# Patient Record
Sex: Male | Born: 1965 | Race: White | Hispanic: No | Marital: Married | Smoking: Current every day smoker
Health system: Southern US, Community
[De-identification: ages and names within clinical notes are randomized; demographics above are authoritative.]

## PROBLEM LIST (undated history)

## (undated) DIAGNOSIS — E785 Hyperlipidemia, unspecified: Secondary | ICD-10-CM

## (undated) DIAGNOSIS — N200 Calculus of kidney: Secondary | ICD-10-CM

## (undated) DIAGNOSIS — G4730 Sleep apnea, unspecified: Secondary | ICD-10-CM

## (undated) DIAGNOSIS — E119 Type 2 diabetes mellitus without complications: Secondary | ICD-10-CM

## (undated) DIAGNOSIS — J13 Pneumonia due to Streptococcus pneumoniae: Secondary | ICD-10-CM

## (undated) DIAGNOSIS — I10 Essential (primary) hypertension: Secondary | ICD-10-CM

## (undated) HISTORY — DX: Calculus of kidney: N20.0

## (undated) HISTORY — PX: KIDNEY STONE SURGERY: SHX686

## (undated) HISTORY — DX: Essential (primary) hypertension: I10

## (undated) HISTORY — DX: Type 2 diabetes mellitus without complications: E11.9

## (undated) HISTORY — PX: TRACHEOSTOMY CLOSURE: SHX458

## (undated) HISTORY — PX: REMOVAL OF GASTROSTOMY TUBE: SHX6058

## (undated) HISTORY — PX: KIDNEY SURGERY: SHX687

## (undated) HISTORY — PX: LITHOTRIPSY: SUR834

## (undated) HISTORY — DX: Hyperlipidemia, unspecified: E78.5

---

## 2001-12-20 ENCOUNTER — Encounter: Admission: RE | Admit: 2001-12-20 | Discharge: 2001-12-20 | Payer: Self-pay | Admitting: Internal Medicine

## 2001-12-20 ENCOUNTER — Encounter: Payer: Self-pay | Admitting: Internal Medicine

## 2001-12-27 ENCOUNTER — Encounter: Admission: RE | Admit: 2001-12-27 | Discharge: 2001-12-27 | Payer: Self-pay | Admitting: Urology

## 2001-12-27 ENCOUNTER — Encounter: Payer: Self-pay | Admitting: Urology

## 2002-01-04 ENCOUNTER — Encounter: Payer: Self-pay | Admitting: Urology

## 2002-01-04 ENCOUNTER — Ambulatory Visit (HOSPITAL_COMMUNITY): Admission: RE | Admit: 2002-01-04 | Discharge: 2002-01-04 | Payer: Self-pay | Admitting: Urology

## 2002-02-07 ENCOUNTER — Inpatient Hospital Stay (HOSPITAL_COMMUNITY): Admission: RE | Admit: 2002-02-07 | Discharge: 2002-02-10 | Payer: Self-pay | Admitting: Urology

## 2002-02-07 ENCOUNTER — Encounter: Payer: Self-pay | Admitting: Urology

## 2002-02-08 ENCOUNTER — Encounter: Payer: Self-pay | Admitting: Urology

## 2002-02-13 ENCOUNTER — Encounter: Payer: Self-pay | Admitting: Urology

## 2002-02-13 ENCOUNTER — Ambulatory Visit (HOSPITAL_COMMUNITY): Admission: RE | Admit: 2002-02-13 | Discharge: 2002-02-13 | Payer: Self-pay | Admitting: Urology

## 2002-02-16 ENCOUNTER — Ambulatory Visit (HOSPITAL_COMMUNITY): Admission: RE | Admit: 2002-02-16 | Discharge: 2002-02-16 | Payer: Self-pay | Admitting: Urology

## 2002-02-16 ENCOUNTER — Encounter: Payer: Self-pay | Admitting: Urology

## 2002-04-05 ENCOUNTER — Encounter: Payer: Self-pay | Admitting: Urology

## 2002-04-05 ENCOUNTER — Ambulatory Visit (HOSPITAL_BASED_OUTPATIENT_CLINIC_OR_DEPARTMENT_OTHER): Admission: RE | Admit: 2002-04-05 | Discharge: 2002-04-05 | Payer: Self-pay | Admitting: Urology

## 2002-04-19 ENCOUNTER — Encounter: Payer: Self-pay | Admitting: Urology

## 2002-04-19 ENCOUNTER — Encounter: Admission: RE | Admit: 2002-04-19 | Discharge: 2002-04-19 | Payer: Self-pay | Admitting: Urology

## 2002-10-19 ENCOUNTER — Encounter: Payer: Self-pay | Admitting: *Deleted

## 2002-10-19 ENCOUNTER — Emergency Department (HOSPITAL_COMMUNITY): Admission: EM | Admit: 2002-10-19 | Discharge: 2002-10-19 | Payer: Self-pay | Admitting: *Deleted

## 2003-06-06 ENCOUNTER — Ambulatory Visit (HOSPITAL_COMMUNITY): Admission: RE | Admit: 2003-06-06 | Discharge: 2003-06-06 | Payer: Self-pay | Admitting: Urology

## 2004-06-16 ENCOUNTER — Emergency Department (HOSPITAL_COMMUNITY): Admission: EM | Admit: 2004-06-16 | Discharge: 2004-06-16 | Payer: Self-pay | Admitting: Emergency Medicine

## 2005-07-06 ENCOUNTER — Emergency Department (HOSPITAL_COMMUNITY): Admission: EM | Admit: 2005-07-06 | Discharge: 2005-07-06 | Payer: Self-pay | Admitting: Emergency Medicine

## 2005-07-30 ENCOUNTER — Ambulatory Visit (HOSPITAL_COMMUNITY): Admission: RE | Admit: 2005-07-30 | Discharge: 2005-08-01 | Payer: Self-pay | Admitting: Urology

## 2005-08-04 ENCOUNTER — Ambulatory Visit (HOSPITAL_COMMUNITY): Admission: RE | Admit: 2005-08-04 | Discharge: 2005-08-04 | Payer: Self-pay | Admitting: Urology

## 2010-04-12 ENCOUNTER — Encounter: Payer: Self-pay | Admitting: Urology

## 2012-03-22 DIAGNOSIS — J13 Pneumonia due to Streptococcus pneumoniae: Secondary | ICD-10-CM

## 2012-03-22 HISTORY — DX: Pneumonia due to Streptococcus pneumoniae: J13

## 2013-04-24 ENCOUNTER — Inpatient Hospital Stay: Payer: BC Managed Care – PPO | Admitting: Internal Medicine

## 2013-04-24 ENCOUNTER — Emergency Department: Payer: BC Managed Care – PPO

## 2013-04-24 ENCOUNTER — Inpatient Hospital Stay: Payer: BC Managed Care – PPO

## 2013-04-24 ENCOUNTER — Inpatient Hospital Stay
Admission: EM | Admit: 2013-04-24 | Discharge: 2013-05-23 | DRG: 004 | Disposition: A | Discharge status: Skilled Nursing Facility | Payer: BC Managed Care – PPO | Attending: Internal Medicine | Admitting: Internal Medicine

## 2013-04-24 DIAGNOSIS — B999 Unspecified infectious disease: Secondary | ICD-10-CM

## 2013-04-24 DIAGNOSIS — E872 Acidosis, unspecified: Secondary | ICD-10-CM | POA: Diagnosis present

## 2013-04-24 DIAGNOSIS — A403 Sepsis due to Streptococcus pneumoniae: Principal | ICD-10-CM | POA: Diagnosis present

## 2013-04-24 DIAGNOSIS — N179 Acute kidney failure, unspecified: Secondary | ICD-10-CM | POA: Diagnosis present

## 2013-04-24 DIAGNOSIS — A419 Sepsis, unspecified organism: Secondary | ICD-10-CM | POA: Diagnosis present

## 2013-04-24 DIAGNOSIS — Y929 Unspecified place or not applicable: Secondary | ICD-10-CM

## 2013-04-24 DIAGNOSIS — R197 Diarrhea, unspecified: Secondary | ICD-10-CM | POA: Diagnosis not present

## 2013-04-24 DIAGNOSIS — E669 Obesity, unspecified: Secondary | ICD-10-CM | POA: Diagnosis present

## 2013-04-24 DIAGNOSIS — E119 Type 2 diabetes mellitus without complications: Secondary | ICD-10-CM | POA: Diagnosis present

## 2013-04-24 DIAGNOSIS — IMO0001 Reserved for inherently not codable concepts without codable children: Secondary | ICD-10-CM | POA: Diagnosis present

## 2013-04-24 DIAGNOSIS — T17308A Unspecified foreign body in larynx causing other injury, initial encounter: Secondary | ICD-10-CM | POA: Diagnosis not present

## 2013-04-24 DIAGNOSIS — I10 Essential (primary) hypertension: Secondary | ICD-10-CM | POA: Diagnosis present

## 2013-04-24 DIAGNOSIS — J9600 Acute respiratory failure, unspecified whether with hypoxia or hypercapnia: Secondary | ICD-10-CM | POA: Diagnosis present

## 2013-04-24 DIAGNOSIS — E871 Hypo-osmolality and hyponatremia: Secondary | ICD-10-CM | POA: Diagnosis not present

## 2013-04-24 DIAGNOSIS — R000 Tachycardia, unspecified: Secondary | ICD-10-CM | POA: Diagnosis present

## 2013-04-24 DIAGNOSIS — R5383 Other fatigue: Secondary | ICD-10-CM | POA: Diagnosis not present

## 2013-04-24 DIAGNOSIS — E876 Hypokalemia: Secondary | ICD-10-CM | POA: Diagnosis present

## 2013-04-24 DIAGNOSIS — X58XXXA Exposure to other specified factors, initial encounter: Secondary | ICD-10-CM | POA: Diagnosis present

## 2013-04-24 DIAGNOSIS — F17200 Nicotine dependence, unspecified, uncomplicated: Secondary | ICD-10-CM | POA: Diagnosis present

## 2013-04-24 DIAGNOSIS — N170 Acute kidney failure with tubular necrosis: Secondary | ICD-10-CM | POA: Diagnosis present

## 2013-04-24 DIAGNOSIS — R7989 Other specified abnormal findings of blood chemistry: Secondary | ICD-10-CM | POA: Diagnosis not present

## 2013-04-24 DIAGNOSIS — E870 Hyperosmolality and hypernatremia: Secondary | ICD-10-CM | POA: Diagnosis present

## 2013-04-24 DIAGNOSIS — R6521 Severe sepsis with septic shock: Secondary | ICD-10-CM | POA: Diagnosis present

## 2013-04-24 DIAGNOSIS — D649 Anemia, unspecified: Secondary | ICD-10-CM | POA: Diagnosis not present

## 2013-04-24 DIAGNOSIS — N200 Calculus of kidney: Secondary | ICD-10-CM | POA: Diagnosis present

## 2013-04-24 DIAGNOSIS — J80 Acute respiratory distress syndrome: Secondary | ICD-10-CM | POA: Diagnosis present

## 2013-04-24 DIAGNOSIS — G589 Mononeuropathy, unspecified: Secondary | ICD-10-CM | POA: Diagnosis not present

## 2013-04-24 DIAGNOSIS — IMO0002 Reserved for concepts with insufficient information to code with codable children: Secondary | ICD-10-CM | POA: Diagnosis present

## 2013-04-24 DIAGNOSIS — J13 Pneumonia due to Streptococcus pneumoniae: Secondary | ICD-10-CM | POA: Diagnosis present

## 2013-04-24 DIAGNOSIS — R6520 Severe sepsis without septic shock: Secondary | ICD-10-CM | POA: Diagnosis present

## 2013-04-24 DIAGNOSIS — D696 Thrombocytopenia, unspecified: Secondary | ICD-10-CM | POA: Diagnosis present

## 2013-04-24 DIAGNOSIS — I82B19 Acute embolism and thrombosis of unspecified subclavian vein: Secondary | ICD-10-CM | POA: Diagnosis not present

## 2013-04-24 DIAGNOSIS — J9690 Respiratory failure, unspecified, unspecified whether with hypoxia or hypercapnia: Secondary | ICD-10-CM | POA: Diagnosis present

## 2013-04-24 DIAGNOSIS — J189 Pneumonia, unspecified organism: Secondary | ICD-10-CM | POA: Diagnosis present

## 2013-04-24 DIAGNOSIS — R609 Edema, unspecified: Secondary | ICD-10-CM | POA: Diagnosis present

## 2013-04-24 DIAGNOSIS — I82629 Acute embolism and thrombosis of deep veins of unspecified upper extremity: Secondary | ICD-10-CM | POA: Diagnosis present

## 2013-04-24 DIAGNOSIS — G7281 Critical illness myopathy: Secondary | ICD-10-CM | POA: Diagnosis not present

## 2013-04-24 DIAGNOSIS — I82A19 Acute embolism and thrombosis of unspecified axillary vein: Secondary | ICD-10-CM | POA: Diagnosis not present

## 2013-04-24 DIAGNOSIS — Z87442 Personal history of urinary calculi: Secondary | ICD-10-CM

## 2013-04-24 LAB — MAN DIFF ONLY
Band Neutrophils Absolute: 7.87 10*3/uL — ABNORMAL HIGH (ref 0.00–1.00)
Band Neutrophils: 43 %
Basophils Absolute Manual: 0.18 10*3/uL (ref 0.00–0.20)
Basophils Manual: 1 %
Eosinophils Absolute Manual: 0.18 10*3/uL (ref 0.00–0.70)
Eosinophils Manual: 1 %
Lymphocytes Absolute Manual: 1.46 10*3/uL (ref 0.50–4.40)
Lymphocytes Manual: 8 %
Monocytes Absolute: 0.18 10*3/uL (ref 0.00–1.20)
Monocytes Manual: 1 %
Neutrophils Absolute Manual: 8.42 10*3/uL — ABNORMAL HIGH (ref 1.80–8.10)
Nucleated RBC: 0 (ref 0–1)
Segmented Neutrophils: 46 %

## 2013-04-24 LAB — COMPREHENSIVE METABOLIC PANEL
ALT: 44 U/L (ref 0–55)
AST (SGOT): 42 U/L — ABNORMAL HIGH (ref 5–34)
Albumin/Globulin Ratio: 0.9 (ref 0.9–2.2)
Albumin: 3.4 g/dL — ABNORMAL LOW (ref 3.5–5.0)
Alkaline Phosphatase: 58 U/L (ref 40–150)
Anion Gap: 20 — ABNORMAL HIGH (ref 5.0–15.0)
BUN: 22 mg/dL — ABNORMAL HIGH (ref 9.0–21.0)
Bilirubin, Total: 1.4 mg/dL — ABNORMAL HIGH (ref 0.2–1.2)
CO2: 20 mEq/L — ABNORMAL LOW (ref 22–29)
Calcium: 9.4 mg/dL (ref 8.5–10.5)
Chloride: 103 mEq/L (ref 98–107)
Creatinine: 1 mg/dL (ref 0.7–1.3)
Globulin: 3.6 g/dL (ref 2.0–3.6)
Glucose: 155 mg/dL — ABNORMAL HIGH (ref 70–100)
Potassium: 3.4 mEq/L — ABNORMAL LOW (ref 3.5–5.1)
Protein, Total: 7 g/dL (ref 6.0–8.3)
Sodium: 143 mEq/L (ref 136–145)

## 2013-04-24 LAB — URINALYSIS WITH MICROSCOPIC
Glucose, UA: 100 — AB
Ketones UA: NEGATIVE
Leukocyte Esterase, UA: NEGATIVE
Nitrite, UA: NEGATIVE
Protein, UR: 100 — AB
Specific Gravity UA: 1.03 (ref 1.001–1.035)
Urine pH: 5.5 (ref 5.0–8.0)
Urobilinogen, UA: 0.2 mg/dL (ref 0.2–2.0)

## 2013-04-24 LAB — ARTERIAL BLOOD GAS (~~LOC~~)
Base Excess, Arterial: -4.1 mEq/L — ABNORMAL LOW (ref <=2.0)
Base Excess, Arterial: -7.6 mEq/L — ABNORMAL LOW (ref <=2.0)
FIO2: 100 %
FIO2: 70 %
HCO3, Arterial: 25.4 mEq/L (ref 22.0–28.0)
HCO3, Arterial: 21 mEq/L — ABNORMAL LOW (ref 22.0–28.0)
O2 Sat, Arterial: 96 % (ref 95.0–100.0)
O2 Sat, Arterial: 85 % — ABNORMAL LOW (ref 95.0–100.0)
PEEP: 8
PEEP: 14
Peak Inspiratory Pressure: 31
Rate: 24
Rate: 28
Respiratory Rate: 28
Temperature: 98.6
Temperature: 100.6
Tidal vol.: 400
Tidal vol.: 380
pCO2, Arterial: 68 (ref 35.0–45.0)
pCO2, Arterial: 55 — ABNORMAL HIGH (ref 35.0–45.0)
pH, Arterial: 7.18 — CL (ref 7.35–7.45)
pH, Arterial: 7.19 — CL (ref 7.35–7.45)
pO2, Arterial: 103 — ABNORMAL HIGH (ref 80.0–100.0)
pO2, Arterial: 63 — ABNORMAL LOW (ref 80.0–100.0)

## 2013-04-24 LAB — GLUCOSE WHOLE BLOOD - POCT
Whole Blood Glucose POCT: 192 mg/dL — ABNORMAL HIGH (ref 70–100)
Whole Blood Glucose POCT: 220 mg/dL — ABNORMAL HIGH (ref 70–100)

## 2013-04-24 LAB — CELL MORPHOLOGY
Cell Morphology: ABNORMAL — AB
Platelet Estimate: NORMAL

## 2013-04-24 LAB — RAPID DRUG SCREEN, URINE
Barbiturate Screen, UR: NEGATIVE
Benzodiazepine Screen, UR: POSITIVE — AB
Cannabinoid Screen, UR: NEGATIVE
Cocaine, UR: NEGATIVE
Opiate Screen, UR: NEGATIVE
PCP Screen, UR: NEGATIVE
Urine Amphetamine Screen: NEGATIVE

## 2013-04-24 LAB — URINALYSIS
Bilirubin, UA: NEGATIVE
Glucose, UA: NEGATIVE
Leukocyte Esterase, UA: NEGATIVE
Nitrite, UA: NEGATIVE
Protein, UR: 100 — AB
Specific Gravity UA: 1.025 (ref 1.001–1.035)
Urine pH: 6 (ref 5.0–8.0)
Urobilinogen, UA: 0.2 mg/dL (ref 0.2–2.0)

## 2013-04-24 LAB — LACTIC ACID, PLASMA
Lactic Acid: 7 mmol/L (ref 0.7–2.1)
Lactic Acid: 4.9 mmol/L (ref 0.7–2.1)

## 2013-04-24 LAB — CBC AND DIFFERENTIAL
Hematocrit: 44.1 % (ref 42.0–52.0)
Hgb: 14.5 g/dL (ref 13.0–17.0)
MCH: 28.9 pg (ref 28.0–32.0)
MCHC: 32.9 g/dL (ref 32.0–36.0)
MCV: 88 fL (ref 80.0–100.0)
MPV: 11.6 fL (ref 9.4–12.3)
Platelets: 134 10*3/uL — ABNORMAL LOW (ref 140–400)
RBC: 5.01 10*6/uL (ref 4.70–6.00)
RDW: 15 % (ref 12–15)
WBC: 18.31 10*3/uL — ABNORMAL HIGH (ref 3.50–10.80)

## 2013-04-24 LAB — URINE MICROSCOPIC

## 2013-04-24 LAB — CK
Creatine Kinase (CK): 150 U/L (ref 30–200)
Creatine Kinase (CK): 101 U/L (ref 30–200)

## 2013-04-24 LAB — IHS D-DIMER: D-Dimer: 1.35 — ABNORMAL HIGH (ref 0.00–0.51)

## 2013-04-24 LAB — B-TYPE NATRIURETIC PEPTIDE: B-Natriuretic Peptide: 494.6 pg/mL — ABNORMAL HIGH (ref 0.0–100.0)

## 2013-04-24 LAB — ETHANOL: Alcohol: NOT DETECTED mg/dL

## 2013-04-24 LAB — MAGNESIUM: Magnesium: 1.6 mg/dL (ref 1.6–2.6)

## 2013-04-24 LAB — GFR: EGFR: >60

## 2013-04-24 LAB — TROPONIN I
Troponin I: 0.01 ng/mL (ref 0.00–0.09)
Troponin I: 0.02 ng/mL (ref 0.00–0.09)

## 2013-04-24 MED ORDER — SODIUM CHLORIDE 0.9 % IV SOLN
1.0000 mg/h | INTRAVENOUS | Status: DC
Start: 2013-04-24 — End: 2013-04-27
  Administered 2013-04-24: 5 mg/h via INTRAVENOUS
  Administered 2013-04-24 – 2013-04-26 (×6): 20 mg/h via INTRAVENOUS
  Administered 2013-04-26: 18 mg/h via INTRAVENOUS
  Administered 2013-04-26 (×2): 20 mg/h via INTRAVENOUS
  Administered 2013-04-27 (×2): 13 mg/h via INTRAVENOUS
  Administered 2013-04-27: 14 mg/h via INTRAVENOUS
  Filled 2013-04-24 (×13): qty 20

## 2013-04-24 MED ORDER — ETOMIDATE 2 MG/ML IV SOLN
INTRAVENOUS | Status: AC | PRN
Start: 2013-04-24 — End: 2013-04-24
  Administered 2013-04-24: 30 mg via INTRAVENOUS

## 2013-04-24 MED ORDER — FENTANYL 10 MCG/ML NS 100 ML (OUTSOURCED)
12.5000 ug/h | INTRAVENOUS | Status: DC
Start: 2013-04-24 — End: 2013-04-27
  Administered 2013-04-24: 200 ug/h via INTRAVENOUS
  Administered 2013-04-24: 12.5 ug/h via INTRAVENOUS
  Administered 2013-04-25 (×5): 200 ug/h via INTRAVENOUS
  Administered 2013-04-26: 100 ug/h via INTRAVENOUS
  Administered 2013-04-26 (×2): 200 ug/h via INTRAVENOUS
  Administered 2013-04-26: 175 ug/h via INTRAVENOUS
  Administered 2013-04-27: 80 ug/h via INTRAVENOUS
  Filled 2013-04-24 (×12): qty 100

## 2013-04-24 MED ORDER — DOCUSATE SODIUM 50 MG/5ML PO LIQD
100.0000 mg | Freq: Two times a day (BID) | ORAL | Status: DC
Start: 2013-04-24 — End: 2013-05-10
  Administered 2013-04-24 – 2013-05-09 (×12): 100 mg via ORAL
  Filled 2013-04-24 (×12): qty 10

## 2013-04-24 MED ORDER — ENOXAPARIN SODIUM 40 MG/0.4ML SC SOLN
40.0000 mg | Freq: Every day | SUBCUTANEOUS | Status: DC
Start: 2013-04-24 — End: 2013-05-02
  Administered 2013-04-24 – 2013-05-02 (×9): 40 mg via SUBCUTANEOUS
  Filled 2013-04-24 (×9): qty 0.4

## 2013-04-24 MED ORDER — GLUCOSE 40 % PO GEL
15.0000 g | ORAL | Status: DC | PRN
Start: 2013-04-24 — End: 2013-05-15

## 2013-04-24 MED ORDER — SODIUM CHLORIDE 0.9 % IV SOLN
INTRAVENOUS | Status: DC
Start: 2013-04-24 — End: 2013-04-24
  Administered 2013-04-24: 150 mL/h via INTRAVENOUS

## 2013-04-24 MED ORDER — OSELTAMIVIR PHOSPHATE 30 MG/5 ML ORAL SYRINGE
75.0000 mg | Freq: Two times a day (BID) | ORAL | Status: DC
Start: 2013-04-24 — End: 2013-04-24

## 2013-04-24 MED ORDER — INSULIN ASPART 100 UNIT/ML SC SOLN
1.0000 [IU] | SUBCUTANEOUS | Status: DC
Start: 2013-04-24 — End: 2013-04-26
  Administered 2013-04-24: 3 [IU] via SUBCUTANEOUS
  Administered 2013-04-24: 1 [IU] via SUBCUTANEOUS
  Administered 2013-04-25 (×4): 5 [IU] via SUBCUTANEOUS
  Administered 2013-04-25: 3 [IU] via SUBCUTANEOUS
  Administered 2013-04-25 – 2013-04-26 (×2): 5 [IU] via SUBCUTANEOUS
  Administered 2013-04-26: 7 [IU] via SUBCUTANEOUS
  Administered 2013-04-26: 5 [IU] via SUBCUTANEOUS
  Filled 2013-04-24: qty 10
  Filled 2013-04-24 (×4): qty 50
  Filled 2013-04-24: qty 30
  Filled 2013-04-24 (×2): qty 50
  Filled 2013-04-24: qty 70
  Filled 2013-04-24: qty 30
  Filled 2013-04-24: qty 50

## 2013-04-24 MED ORDER — SODIUM CHLORIDE 0.9 % IV MBP
1.0000 g | Freq: Once | INTRAVENOUS | Status: AC
Start: 2013-04-24 — End: 2013-04-24
  Administered 2013-04-24: 1 g via INTRAVENOUS
  Filled 2013-04-24: qty 1000
  Filled 2013-04-24: qty 50

## 2013-04-24 MED ORDER — CHLORHEXIDINE GLUCONATE 0.12 % MT SOLN
15.0000 mL | Freq: Two times a day (BID) | OROMUCOSAL | Status: DC
Start: 2013-04-24 — End: 2013-05-21
  Administered 2013-04-24 – 2013-05-20 (×53): 15 mL via OROMUCOSAL

## 2013-04-24 MED ORDER — VECURONIUM BROMIDE 10 MG IV SOLR
INTRAVENOUS | Status: AC
Start: 2013-04-24 — End: 2013-04-24
  Administered 2013-04-24: 10 mg
  Filled 2013-04-24: qty 10

## 2013-04-24 MED ORDER — PROPOFOL 10 MG/ML IV EMUL
INTRAVENOUS | Status: AC
Start: 2013-04-24 — End: 2013-04-24
  Filled 2013-04-24: qty 100

## 2013-04-24 MED ORDER — SODIUM CHLORIDE 0.9 % IV BOLUS
1000.0000 mL | Freq: Once | INTRAVENOUS | Status: AC
Start: 2013-04-24 — End: 2013-04-24
  Administered 2013-04-24: 1000 mL via INTRAVENOUS

## 2013-04-24 MED ORDER — METHYLPREDNISOLONE SODIUM SUCC 125 MG IJ SOLR
125.0000 mg | Freq: Once | INTRAMUSCULAR | Status: AC
Start: 2013-04-24 — End: 2013-04-24
  Administered 2013-04-24: 125 mg via INTRAVENOUS
  Filled 2013-04-24: qty 2

## 2013-04-24 MED ORDER — THIAMINE HCL 100 MG PO TABS
100.0000 mg | ORAL_TABLET | Freq: Every day | ORAL | Status: DC
Start: 2013-04-24 — End: 2013-05-10
  Administered 2013-04-24 – 2013-05-09 (×16): 100 mg via ORAL
  Filled 2013-04-24 (×16): qty 1

## 2013-04-24 MED ORDER — LORAZEPAM 2 MG/ML IJ SOLN
INTRAMUSCULAR | Status: AC
Start: 2013-04-24 — End: 2013-04-24
  Administered 2013-04-24: 2 mg
  Filled 2013-04-24: qty 1

## 2013-04-24 MED ORDER — LEVOFLOXACIN IN D5W 750 MG/150ML IV SOLN
750.0000 mg | Freq: Every day | INTRAVENOUS | Status: DC
Start: 2013-04-24 — End: 2013-04-25
  Administered 2013-04-24: 750 mg via INTRAVENOUS
  Filled 2013-04-24: qty 150

## 2013-04-24 MED ORDER — ACETAMINOPHEN 160 MG/5ML PO SOLN
500.0000 mg | ORAL | Status: DC | PRN
Start: 2013-04-24 — End: 2013-05-23
  Administered 2013-04-24 – 2013-05-16 (×18): 500 mg via ORAL
  Filled 2013-04-24 (×19): qty 20.3

## 2013-04-24 MED ORDER — ALBUTEROL SULFATE (2.5 MG/3ML) 0.083% IN NEBU
2.5000 mg | INHALATION_SOLUTION | Freq: Once | RESPIRATORY_TRACT | Status: AC
Start: 2013-04-24 — End: 2013-04-24

## 2013-04-24 MED ORDER — PHENYLEPHRINE INFUSION (SIMPLE)
50.0000 ug/min | Status: DC
Start: 2013-04-24 — End: 2013-05-03
  Administered 2013-04-24: 50 ug/min via INTRAVENOUS
  Administered 2013-04-24: 500 ug/min via INTRAVENOUS
  Administered 2013-04-25: 350 ug/min via INTRAVENOUS
  Administered 2013-04-25: 175 ug/min via INTRAVENOUS
  Administered 2013-04-25: 220 ug/min via INTRAVENOUS
  Administered 2013-04-26: 50 ug/min via INTRAVENOUS
  Filled 2013-04-24 (×6): qty 250

## 2013-04-24 MED ORDER — PIPERACILLIN-TAZOBACTAM 4.5 GM IN NS 100 ML IVPB (CNR)
4.5000 g | Freq: Three times a day (TID) | INTRAVENOUS | Status: DC
Start: 2013-04-24 — End: 2013-04-24

## 2013-04-24 MED ORDER — VANCOMYCIN 1000 MG IN 250 ML NS IVPB VIAL-MATE (CNR)
1000.0000 mg | Freq: Three times a day (TID) | INTRAVENOUS | Status: DC
Start: 2013-04-25 — End: 2013-04-25
  Administered 2013-04-25 (×2): 1000 mg via INTRAVENOUS
  Filled 2013-04-24 (×3): qty 250

## 2013-04-24 MED ORDER — OSELTAMIVIR PHOSPHATE 30 MG/5 ML ORAL SYRINGE
75.0000 mg | Freq: Two times a day (BID) | ORAL | Status: DC
Start: 2013-04-24 — End: 2013-04-25
  Administered 2013-04-24: 75 mg via ORAL
  Filled 2013-04-24 (×5): qty 12.5

## 2013-04-24 MED ORDER — SUCCINYLCHOLINE CHLORIDE 20 MG/ML IJ SOLN
INTRAMUSCULAR | Status: AC | PRN
Start: 2013-04-24 — End: 2013-04-24
  Administered 2013-04-24: 200 mg via INTRAVENOUS

## 2013-04-24 MED ORDER — FENTANYL CITRATE 0.05 MG/ML IJ SOLN
INTRAMUSCULAR | Status: AC
Start: 2013-04-24 — End: 2013-04-24
  Administered 2013-04-24: 25 ug via INTRAVENOUS
  Filled 2013-04-24: qty 5

## 2013-04-24 MED ORDER — IPRATROPIUM BROMIDE 0.02 % IN SOLN
0.5000 mg | Freq: Once | RESPIRATORY_TRACT | Status: AC
Start: 2013-04-24 — End: 2013-04-24
  Administered 2013-04-24: 0.5 mg via RESPIRATORY_TRACT
  Filled 2013-04-24: qty 2.5

## 2013-04-24 MED ORDER — ALBUTEROL SULFATE (2.5 MG/3ML) 0.083% IN NEBU
2.5000 mg | INHALATION_SOLUTION | Freq: Once | RESPIRATORY_TRACT | Status: AC
Start: 2013-04-24 — End: 2013-04-24
  Administered 2013-04-24: 2.5 mg via RESPIRATORY_TRACT
  Filled 2013-04-24: qty 3

## 2013-04-24 MED ORDER — DEXTROSE 50 % IV SOLN
25.0000 mL | INTRAVENOUS | Status: DC | PRN
Start: 2013-04-24 — End: 2013-05-15

## 2013-04-24 MED ORDER — LORAZEPAM 2 MG/ML IJ SOLN
2.0000 mg | INTRAMUSCULAR | Status: DC | PRN
Start: 2013-04-24 — End: 2013-04-27

## 2013-04-24 MED ORDER — SODIUM CHLORIDE 0.9 % IV SOLN
500.0000 mg | Freq: Once | INTRAVENOUS | Status: AC
Start: 2013-04-24 — End: 2013-04-24
  Administered 2013-04-24: 500 mg via INTRAVENOUS
  Filled 2013-04-24: qty 500

## 2013-04-24 MED ORDER — ALBUTEROL SULFATE (2.5 MG/3ML) 0.083% IN NEBU
2.5000 mg | INHALATION_SOLUTION | RESPIRATORY_TRACT | Status: DC | PRN
Start: 2013-04-24 — End: 2013-05-23
  Administered 2013-05-06: 2.5 mg via RESPIRATORY_TRACT
  Filled 2013-04-24: qty 3

## 2013-04-24 MED ORDER — ALBUTEROL SULFATE (2.5 MG/3ML) 0.083% IN NEBU
INHALATION_SOLUTION | RESPIRATORY_TRACT | Status: AC
Start: 2013-04-24 — End: 2013-04-24
  Administered 2013-04-24: 2.5 mg via RESPIRATORY_TRACT
  Filled 2013-04-24: qty 3

## 2013-04-24 MED ORDER — PROPOFOL INFUSION 10 MG/ML
0.0000 ug/kg/min | INTRAVENOUS | Status: DC
Start: 2013-04-24 — End: 2013-04-27
  Administered 2013-04-24: 20 ug/kg/min via INTRAVENOUS
  Administered 2013-04-25: 10 ug/kg/min via INTRAVENOUS
  Administered 2013-04-25: 20 ug/kg/min via INTRAVENOUS
  Administered 2013-04-25: 10 ug/kg/min via INTRAVENOUS
  Administered 2013-04-25 – 2013-04-26 (×2): 20 ug/kg/min via INTRAVENOUS
  Filled 2013-04-24 (×7): qty 100

## 2013-04-24 MED ORDER — SODIUM BICARBONATE 8.4 % IV SOLN
100.0000 mL/h | INTRAVENOUS | Status: DC
Start: 2013-04-24 — End: 2013-04-25
  Administered 2013-04-24 – 2013-04-25 (×3): 150 mL/h via INTRAVENOUS
  Filled 2013-04-24 (×3): qty 1000

## 2013-04-24 MED ORDER — GLUCAGON HCL (RDNA) 1 MG IJ SOLR
1.0000 mg | INTRAMUSCULAR | Status: DC | PRN
Start: 2013-04-24 — End: 2013-05-15

## 2013-04-24 MED ORDER — FAMOTIDINE 20MG/2.5ML PO UNIT DOSE SYRINGE
20.0000 mg | Freq: Two times a day (BID) | ORAL | Status: DC
Start: 2013-04-24 — End: 2013-05-03
  Administered 2013-04-24 – 2013-05-03 (×18): 20 mg via OROGASTRIC
  Filled 2013-04-24 (×19): qty 2.5

## 2013-04-24 MED ORDER — VANCOMYCIN HCL 1000 MG IV SOLR
2500.0000 mg | Freq: Once | INTRAVENOUS | Status: AC
Start: 2013-04-24 — End: 2013-04-24
  Administered 2013-04-24: 2500 mg via INTRAVENOUS
  Filled 2013-04-24: qty 2500

## 2013-04-24 MED ORDER — METHYLPREDNISOLONE SODIUM SUCC 40 MG IJ SOLR
40.0000 mg | Freq: Four times a day (QID) | INTRAMUSCULAR | Status: DC
Start: 2013-04-24 — End: 2013-04-25
  Administered 2013-04-24 – 2013-04-25 (×3): 40 mg via INTRAVENOUS
  Filled 2013-04-24 (×3): qty 1

## 2013-04-24 MED ORDER — FENTANYL CITRATE 0.05 MG/ML IJ SOLN
25.0000 ug | INTRAMUSCULAR | Status: DC | PRN
Start: 2013-04-24 — End: 2013-04-29

## 2013-04-24 MED ORDER — ALBUTEROL-IPRATROPIUM 2.5-0.5 (3) MG/3ML IN SOLN
3.0000 mL | RESPIRATORY_TRACT | Status: DC
Start: 2013-04-24 — End: 2013-05-23
  Administered 2013-04-24 – 2013-05-23 (×172): 3 mL via RESPIRATORY_TRACT
  Filled 2013-04-24 (×168): qty 3

## 2013-04-24 MED ORDER — VECURONIUM BROMIDE 10 MG IV SOLR
10.0000 mg | Freq: Four times a day (QID) | INTRAVENOUS | Status: DC | PRN
Start: 2013-04-24 — End: 2013-05-10
  Administered 2013-04-24: 10 mg via INTRAVENOUS
  Filled 2013-04-24: qty 10

## 2013-04-24 MED ORDER — POLYETHYLENE GLYCOL 3350 17 G PO PACK
17.0000 g | PACK | Freq: Two times a day (BID) | ORAL | Status: DC
Start: 2013-04-24 — End: 2013-05-10
  Administered 2013-04-24 – 2013-05-09 (×10): 17 g via ORAL
  Filled 2013-04-24 (×10): qty 1

## 2013-04-24 MED ORDER — PIPERACILLIN-TAZOBACTAM 4.5 GM IN NS 100 ML IVPB (CNR)
4.5000 g | Freq: Three times a day (TID) | INTRAVENOUS | Status: DC
Start: 2013-04-24 — End: 2013-04-26
  Administered 2013-04-24 – 2013-04-26 (×6): 4.5 g via INTRAVENOUS
  Filled 2013-04-24 (×6): qty 100

## 2013-04-24 MED ORDER — SODIUM CHLORIDE 0.9 % IV BOLUS
1000.0000 mL | INTRAVENOUS | Status: AC | PRN
Start: 2013-04-24 — End: 2013-04-24
  Administered 2013-04-24 (×5): 1000 mL via INTRAVENOUS

## 2013-04-24 MED ORDER — VECURONIUM BROMIDE 10 MG IV SOLR
10.0000 mg | Freq: Once | INTRAVENOUS | Status: AC
Start: 2013-04-24 — End: 2013-04-24
  Administered 2013-04-24: 10 mg via INTRAVENOUS
  Filled 2013-04-24: qty 10

## 2013-04-24 MED ORDER — RISAQUAD PO CAPS
1.0000 | ORAL_CAPSULE | Freq: Every day | ORAL | Status: DC
Start: 2013-04-24 — End: 2013-05-23
  Administered 2013-04-24 – 2013-05-23 (×28): 1 via ORAL
  Filled 2013-04-24 (×28): qty 1

## 2013-04-24 NOTE — Consults (Addendum)
Department of Pharmacy Vancomycin Dosing Consult:  Initiation    MRN: [id number]  Room/Bed: 02/02    Adam Simmons is being treated with Vancomycin for Pneumonia.  Pharmacy was consulted to dose Vancomycin by Dr. Janalyn Rouse.    Dosing was based on the following parameters:  Pt Age:  48 y.o.  Pt Weight:  113.4 kg  Pt Height:  68 inches  Estimated Creatinine Clearance: 110.4 ml/min (based on Cr of 1).  Target trough: 17.5 mcg/mL    The following orders were entered in EPIC:  Vancomycin loading dose of 2500 mg IV.  Vancomycin maintenance dose of 1000mg  IV every  8 hours.  Vancomycin trough prior to the 5th dose.    Pharmacy will monitor drug levels and kidney function and adjust doses as necessary.    Thank you for the consult.  Please contact the pharmacy with any questions at  [phone].    Signed:  Ivy Lynn  Date/Time 04/24/2013 2:22 PM      [facility] Department of Pharmacy Vancomycin Kinetics Dosing Consult:  Dose Adjustment    MRN: [id number]  Room/Bed:  IC05/IC05-A    Estimated Creatinine Clearance: 65.1 ml/min (based on Cr of 1.8).  Target trough: 17.5 mcg/mL    Based on the current Scr of 1.8 and deteorioting kidney function, hold current Vancomycin regimen starting today.  Pharmacy to order Random and adjust dose based on levels.    Pharmacy will continue to monitor drug level and kidney function and adjust doses as necessary.    Thank you for the consult.  Please contact the pharmacy with any questions at  [phone].    Signed:  Harrie Foreman    Date/Time 04/25/2013 5:04 PM                    [facility] Department of Pharmacy Vancomycin Kinetics Dosing Consult:  Dose Adjustment    MRN: [id number]  Room/Bed:  IC05/IC05-A    Adam Simmons  is being treated with Vancomycin for pneumonia.  Pharmacy was consulted to dose Vancomycin by Dr.Choudhary.    Based on the following parameters:  Pt Age:  48 y.o.  Pt Weight:  134.7 kg  Pt Height:  68 inches  Estimated Creatinine Clearance: 41.8 ml/min (based on Cr of 2.9).  Target trough: 17.5  mcg/mL    Vancomycin Random   Date Value Range Status   04/27/2013 12.0   Final       Based on the random Vancomycin level drawn on 04/27/13 around 0400, level = 12, give Vancomycin 1500mg  x 1 today. Draw next random Vancomycin level on 04/29/13 with AM labs.    Pharmacy will continue to monitor drug level and kidney function and adjust doses as necessary.    Thank you for the consult.  Please contact the pharmacy with any questions at  [phone].    Signed:  Luiz Ochoa    Date/Time 04/27/2013 6:57 AM

## 2013-04-24 NOTE — Plan of Care (Signed)
Infectious Disease   Full Consult Dictated    04/24/2013   Nylan Nevel JYN:[id number]:[id number] is a 48 y.o. male, with respiratory failure, pneumonia, sepsis      Recommendations:  I would like to suggest the following approach:    Therapy     Zosyn   Levaquin   Vancomycin    LABS:      CBC with Diff   CMP   Urine analysis & Culture   Urinary Legionella and strep Pneumonia antigen   Chlamydia antibody panel   Mycoplasma IgM, IgG   Repeat Blood cultures if Spike more than 100.5.   Will Follow the serologies.    Radiology:   CT chest when stable     I will follow this patient Closely with you, Thank you Arline Asp for Involving me in care of  Gibril Mastro, M.D.,FACP  04/24/2013  2:02 PM

## 2013-04-24 NOTE — ED Notes (Signed)
Patient arrives to the ER via EMS with SOB, CPAP started by EMS. No IV access on arrival. A&A neb treatment started. Patient denies any pain just struggling to breathe. Denies any travel outside of the [place].

## 2013-04-24 NOTE — H&P (Addendum)
[facility]- Critical Care Note      ADMISSION- HISTORY AND PHYSICAL EXAM      Date Time: 04/24/2013 5:05 PM  Patient Name: Adam Simmons  Attending Physician: Lawernce Ion, MD  Primary Care Physician: Christa See, MD  Location/Room: IC05/IC05-A     Assessment:   Problem List:   Patient Active Problem List   Diagnosis   . Respiratory failure     Respiratory failure from multilobar pneumonia with hypoxia on ventilator concerning for ARDS.     Plan:     # Neurologic:  Patient needs to be sedated in order to allow mechanical  ventilatory support.  He is able to move all 4 extremities and track with  Eyes.    #  Respiratory:  Respiratory failure from what appears to be multilobar  pneumonia with hypoxia on ventilator, concerning for ARDS at this time.   Will enact lung protective strategy at this time, repeat an ABG in 1 hour  after new ventilatory changes.  The patient has been pancultured.   Unfortunately, he is unstable at this time for closer look at his lungs  with a chest CT, given that x-ray reading indicates possible lung mass.   The patient is not at this time an ideal candidate for bronchoscopy to  further evaluate his right side given his severe hypoxia and concern for  multilobar pneumonia.  Would stabilize patient and re-evaluate over time. Cannot rule out influenza at this time. Empirically start tamiflu. Check influenza nasal wash and influenza pcr.    #  Nutrition:  N.p.o.  Consult nutritionist for tube feed recommendation.    #  Endocrine:  Diabetes; initiate sliding-scale insulin.    # Cardiac:  septic shock:  Initiate Neo-Synephrine.  The patient has been aggressively fluid resuscitated.  Would trend cardiac enzymes. Check echocardiogram to rule out cardiac involvement causing infiltrate    #  Hematologic:  Thrombocytopenia most likely from underlying sepsis    #  Infectious diseases:  Multilobar pneumonia.  Appreciate Dr. Myrtis Ser  consultation and evaluation.  Continue  broad-spectrum antibiotics.  The  patient has been pancultured. Empirically treat for influenza.     Code Status: full code  I have personally reviewed the patient's history and 24 hour interval events, along with vitals, labs, radiology images, and nurses report.      addendum  12:35 AM  Pt's wife came up from [place].  Updated her on her husband current condition and answered her questions.      Chief Complaint / Primary Reason for ICU evaluation :      Chief Complaint   Shortness of breath, respiratory failure    History of Presenting Illness:   Adam Simmons is a 48 y.o. male h/o DM type 2, HTN who presents to the hospital with respiratory failure. Per ER report, patient had approx. 1 week history of shortness of breath and URI symptoms.  He had worsening shortness of breath and required intubation. Patient arrived to ICU with high ventilatory settings.  He was also hypotensive despite aggressive fluid resuscitation.  He had documented fevers.      Past Medical History:     Past Medical History   Diagnosis Date   . Hypertension    . Diabetes mellitus        Available old records reviewed, including: none available    Past Surgical History:   History reviewed. No pertinent past surgical history.    Family History:   No family  history on file.    Social History:     History     Social History   . Marital Status: Married     Spouse Name: N/A     Number of Children: N/A   . Years of Education: N/A     Social History Main Topics   . Smoking status: Current Every Day Smoker     Types: Cigarettes   . Smokeless tobacco: Not on file   . Alcohol Use: Yes      Comment: rare   . Drug Use: No   . Sexually Active: Not on file     Other Topics Concern   . Not on file     Social History Narrative   . No narrative on file       Allergies:   No Known Allergies        Medications:     Prescriptions prior to admission   Medication Sig   . metFORMIN (GLUCOPHAGE) 1000 MG tablet Take 1,000 mg by mouth 2 (two) times daily with  meals.   . naproxen (NAPROSYN) 375 MG tablet Take 375 mg by mouth 2 (two) times daily with meals.   . fluconazole (DIFLUCAN) 100 MG tablet Take 100 mg by mouth daily.   . Potassium (POTASSIMIN PO) Take by mouth.       Current Inpatient :  Current Facility-Administered Medications   Medication Dose Route Frequency   . [COMPLETED] albuterol  2.5 mg Nebulization Once   . [COMPLETED] azithromycin  500 mg Intravenous Once   . [COMPLETED] cefTRIAXone  1 g Intravenous Once   . chlorhexidine  15 mL Mouth/Throat BID   . enoxaparin  40 mg Subcutaneous Daily   . [COMPLETED] ipratropium  0.5 mg Nebulization Once   . levofloxacin  750 mg Intravenous Daily   . [COMPLETED] LORazepam       . [COMPLETED] methylprednisolone  125 mg Intravenous Once   . piperacillin-tazobactam  4.5 g Intravenous Q8H SCH   . [COMPLETED] propofol       . [COMPLETED] propofol       . [COMPLETED] sodium chloride  1,000 mL Intravenous Once   . [COMPLETED] sodium chloride  1,000 mL Intravenous Once   . [COMPLETED] sodium chloride  1,000 mL Intravenous Once   . [START ON 04/25/2013] vancomycin  1,000 mg Intravenous Q8H SCH   . [COMPLETED] vancomycin  2,500 mg Intravenous Once   . [COMPLETED] vecuronium       . [DISCONTINUED] piperacillin-tazobactam  4.5 g Intravenous [facility]       Home Medications :     Prior to Admission medications    Medication Sig Start Date End Date Taking? Authorizing Provider   metFORMIN (GLUCOPHAGE) 1000 MG tablet Take 1,000 mg by mouth 2 (two) times daily with meals.   Yes [provider]   naproxen (NAPROSYN) 375 MG tablet Take 375 mg by mouth 2 (two) times daily with meals.   Yes [provider]   fluconazole (DIFLUCAN) 100 MG tablet Take 100 mg by mouth daily.    [provider]   Potassium (POTASSIMIN PO) Take by mouth.    [provider]        Review of Systems:   Unable to obtain secondary to intubated/sedated    Physical Exam:     Filed Vitals:    04/24/13 1615   BP: 88/61   Pulse: 112    Temp:    Resp: 35   SpO2: 97%  Intake and Output Summary (Last 24 hours) at Date Time    Intake/Output Summary (Last 24 hours) at 04/24/13 1705  Last data filed at 04/24/13 1522   Gross per 24 hour   Intake  09811 ml   Output   1300 ml   Net  31800 ml       General Appearance: obese, tachypnic on ventilator, intubated  Mental status:  Sedated but opens eyes easily  Neuro:  Sedated, intubated, able to spontaneously move all 4 limbs  Neck: supple  Lungs: bilateral rhonchi anteriorly, expiratory wheezing  Cardiac: ns1, ns2, no m/r/g, tachycardic  Abdomen:  Obese, soft, nontender, hypoactive bowel sounds  Extremities: trace lower extremity edema  Skin:  Has band like abrasion around bilateral ankles, has a triangular mark on right forearm area that is scabbed over, has abrasion on left forearm, has grease on his hands, has abrasions on his sacral area, has various bruising on his upper extremities.     Labs:      Labs (last 72 hours):  Recent Labs   Basename 04/24/13 1103    WBC 18.31*    HGB 14.5    HCT 44.1    LABPLAT --     No results found for this basename: PT:3,INR:3,PTT:3 in the last 72 hours Recent Labs   Basename 04/24/13 1103    NA 143    K 3.4*    CL 103    CO2 20*    BUN 22.0*    CREAT 1.0    GLU 155*    CA 9.4    MG --    PHOS --                   Radiology / Imaging:     Imaging personally reviewed by me and agree with radiology report including:      CXR  04/24/2013    Multilobar consolidation right >left side      Other ICU Data   Vent Settings:    Vent Settings  Vent Mode: PRVC  FiO2: 100 %  Resp Rate (Set): 28   Vt (Set, mL): 450 mL  PIP Observed (cm H2O): 29 cm H2O  PEEP/EPAP: 8 cm H20  Pressure Support / IPAP: 10 cmH20  Mean Airway Pressure: 18 cmH20    Invasive ICU Hemodynamics:  Invasive Hemodynamic Monitoring  CVP (mmHg): 22 mmHg  Art Line  Arterial Line MAP (mmHg): 28 mmHg       Line, Drains,Airway :       CVC Triple Lumen 04/24/13 Left Internal jugular (Active)   Number of days:0            Signed by: Lawernce Ion  cc:Pcp, Noneorunknown, MD

## 2013-04-24 NOTE — ED Provider Notes (Addendum)
Physician/Midlevel provider first contact with patient: 04/24/13 1054         History     Chief Complaint   Patient presents with   . Shortness of Breath     HPI Comments: Pt arrives via EMS with CPAP on, obviously tachypneic, somewhat pale, in respiratory distress.  Pt is able to tell me he has felt fluid in his chest for a week, it has gotten much worse and answers no COPD or CHF history. Pt has a difficult time talking due to severe shortness of breath.    Patient is a 48 y.o. male presenting with shortness of breath. The history is provided by the patient and the EMS personnel. The history is limited by the condition of the patient.   Shortness of Breath   The current episode started 5 to 7 days ago. The onset was gradual. The problem occurs frequently. The problem has been rapidly worsening. The problem is severe. Nothing relieves the symptoms. Nothing aggravates the symptoms. Associated symptoms include cough and shortness of breath. Pertinent negatives include no chest pain, no chest pressure, no orthopnea and no fever. The Heimlich maneuver was not attempted. He has had no prior steroid use. There were no sick contacts. He has received no recent medical care.       Past Medical History   Diagnosis Date   . Hypertension    . Diabetes mellitus    . Kidney stones        Past Surgical History   Procedure Date   . Kidney stone surgery        No family history on file.    Social  History   Substance Use Topics   . Smoking status: Current Every Day Smoker     Types: Cigarettes   . Smokeless tobacco: Not on file      Comment: Cigars only   . Alcohol Use: 3.6 oz/week     6 Cans of beer per week      Comment: 2 beers daily       .     No Known Allergies    Current/Home Medications    FLUCONAZOLE (DIFLUCAN) 100 MG TABLET    Take 100 mg by mouth daily.    METFORMIN (GLUCOPHAGE) 1000 MG TABLET    Take 1,000 mg by mouth 2 (two) times daily with meals.    NAPROXEN (NAPROSYN) 375 MG TABLET    Take 375 mg by mouth 2 (two)  times daily with meals.    POTASSIUM (POTASSIMIN PO)    Take by mouth.        Review of Systems   Unable to perform ROS: Unstable vital signs   Constitutional: Negative for fever.   Respiratory: Positive for cough and shortness of breath.    Cardiovascular: Negative for chest pain and orthopnea.       Physical Exam    BP: 168/86 mmHg, Heart Rate: 140 , Temp: 96.4 F (35.8 C), Resp Rate: 46 , SpO2: 79 %, Weight: 120.203 kg    Physical Exam   Nursing note and vitals reviewed.  Constitutional: He appears well-developed. He appears distressed.        Obese white male in respiratory distress   HENT:   Head: Normocephalic and atraumatic.   Eyes: Conjunctivae normal are normal. Pupils are equal, round, and reactive to light. Right eye exhibits no discharge. Left eye exhibits no discharge. No scleral icterus.   Neck: Neck supple. No tracheal deviation present.  Cardiovascular: Regular rhythm.  Tachycardia present.    Pulmonary/Chest: Accessory muscle usage present. Tachypnea noted. He is in respiratory distress.   Abdominal: Soft. He exhibits no distension. There is no tenderness. There is no rebound and no guarding.   Musculoskeletal: He exhibits no edema and no tenderness.   Neurological: He is alert. He exhibits normal muscle tone. Coordination normal.   Skin: He is diaphoretic. No erythema.   Psychiatric: His mood appears anxious. He is not agitated, not aggressive and is not hyperactive. Cognition and memory are not impaired.       MDM and ED Course     ED Medication Orders      Start     Status Ordering Provider    04/24/13 1502   propofol (DIPRIVAN) 10 MG/ML injection      Comments: Created by cabinet override        Last MAR action:  New Bag     04/24/13 1249   sodium chloride 0.9 % bolus 1,000 mL   Once      Route: Intravenous  Ordered Dose: 1,000 mL         Last MAR action:  [employer], Kiosha Buchan J    04/24/13 1247   sodium chloride 0.9 % bolus 1,000 mL   Once      Route: Intravenous  Ordered Dose: 1,000 mL          Last MAR action:  [employer], Donaciano Range J    04/24/13 1146   LORazepam (ATIVAN) 2 mg/mL injection      Comments: Created by cabinet override        Last MAR action:  Given     04/24/13 1146   vecuronium (NORCURON) 10 MG injection      Comments: Created by cabinet override        Last MAR action:  Given     04/24/13 1135   sodium chloride 0.9 % bolus 1,000 mL   Once      Route: Intravenous  Ordered Dose: 1,000 mL         Last MAR action:  Stopped Neely Kammerer J    04/24/13 1131   propofol (DIPRIVAN) 10 MG/ML injection      Comments: Created by cabinet override        Last MAR action:  Rate/Dose Change     04/24/13 1123   succinylcholine (ANECTINE) injection   Code/trauma/sedation medication      Route: Intravenous         Last MAR action:  Given Karman Biswell J    04/24/13 1122   etomidate (AMIDATE) injection   Code/trauma/sedation medication      Route: Intravenous         Last MAR action:  Given Evangela Heffler J    04/24/13 1112   cefTRIAXone (ROCEPHIN) 1 g in sodium chloride 0.9 % 50 mL IVPB mini-bag plus   Once      Route: Intravenous  Ordered Dose: 1 g         Last MAR action:  Stopped Taylen Wendland J    04/24/13 1112   azithromycin (ZITHROMAX) 500 mg in sodium chloride 0.9 % 250 mL IVPB   Once      Route: Intravenous  Ordered Dose: 500 mg         Last MAR action:  [employer], Bryah Ocheltree J    04/24/13 1057   methylprednisolone sodium succinate (Solu-MEDROL) injection 125 mg   Once  Route: Intravenous  Ordered Dose: 125 mg         Last MAR action:  Given Khian Remo J    04/24/13 1057   albuterol (PROVENTIL) nebulizer solution 2.5 mg   RT - Once      Route: Nebulization  Ordered Dose: 2.5 mg         Last MAR action:  Given Kristien Salatino J    04/24/13 1057   ipratropium (ATROVENT) 0.02 % nebulizer solution 0.5 mg   RT - Once      Route: Nebulization  Ordered Dose: 0.5 mg         Last MAR action:  Given Sherrel Ploch J                 MDM  Number of Diagnoses or Management  Options  Pneumonia:   Respiratory failure:   Diagnosis management comments: I, Roselee Culver, DO (emergency medicine physician), have been the primary provider for this patient during this Emergency Dept visit.     I have reviewed nursing notes on family and social history, past medical issues, and recorded vital signs.    I have reviewed all laboratory tests and radiographic studies and have explained these results to the patient and/or family bedside.    Oxygen saturation by pulse oximetry is 78%, hypoxia.  Interventions: oxygen, BiPAP, then intubation.    EKG interpretation by Dr. Cassandria Santee, emergency medicine physician.  EKG is Abnormal and nonspecific, sinus tachycardia  ST segments show nonspecific abnormalities but there is no acute st elevation.  Rate is tachycardic, 139  No significant widened intervals.    DDX: sepsis, respiratory failure, pneumonia, CHF, COPD, PE    Pt presents in obvious distress, bipap trialed and pt does poorly with this, intubation needed.  He is cognizant enough and seems to understand this when I explain it to him, he is too sick to give written consent.  Difficult airway predicted based on short neck and obesity.  Airway established and abx given. Central line placed due to presumed sepsis, lactic acid 7, and ICU consulted for admission.  Pt was normotensive or hypertensive while in the ED.     Discussed briefly with Dr Lesle Reek then later discussed with Dr Nedra Hai, ICU.         Amount and/or Complexity of Data Reviewed  Clinical lab tests: ordered and reviewed  Tests in the radiology section of CPT: ordered and reviewed  Tests in the medicine section of CPT: ordered and reviewed  Decide to obtain previous medical records or to obtain history from someone other than the patient: yes  Obtain history from someone other than the patient: yes  Review and summarize past medical records: yes  Discuss the patient with other providers: yes  Independent visualization of images, tracings, or  specimens: yes    Risk of Complications, Morbidity, and/or Mortality  Presenting problems: high  Diagnostic procedures: high  Management options: high    Critical Care  Total time providing critical care: 30-74 minutes        Intubation  Date/Time: 04/24/2013 11:30 AM  Performed by: Toni Arthurs  Authorized by: Toni Arthurs  Consent: The procedure was performed in an emergent situation.  Risks and benefits: risks, benefits and alternatives were discussed  Consent given by: patient  Indications: respiratory distress and respiratory failure  Intubation method: direct ([employer] laryngoscope)  Patient status: paralyzed (RSI)  Preoxygenation: nonrebreather mask  Sedatives: etomidate  Paralytic: succinylcholine  Laryngoscope size: Mac  3  Tube size: 7.5 mm  Tube type: cuffed  Number of attempts: 2 (parmedic took one look with MAC 4, pt vomiting, I took over after bagging briefly and placed ETT via [employer] without any pt decompensation)  Ventilation between attempts: BVM  Cricoid pressure: no  Cords visualized: yes  Post-procedure assessment: chest rise and ETCO2 monitor  Breath sounds: equal  Cuff inflated: yes  ETT to teeth: 25 cm  Tube secured with: ETT holder  Chest x-ray interpreted by me and radiologist.  Chest x-ray findings: endotracheal tube in appropriate position  Patient tolerance: Patient tolerated the procedure well with no immediate complications.    Central Line  Date/Time: 04/24/2013 12:00 PM  Performed by: Toni Arthurs  Authorized by: Toni Arthurs  Consent: Verbal consent not obtained. The procedure was performed in an emergent situation.  Indications: vascular access and central pressure monitoring  Anesthesia: local infiltration  Local anesthetic: lidocaine 1% without epinephrine  Anesthetic total: 2 ml  Preparation: skin prepped with 2% chlorhexidine  Skin prep agent dried: skin prep agent completely dried prior to procedure  Sterile barriers: all five maximum sterile barriers  used - cap, mask, sterile gown, sterile gloves, and large sterile sheet  Hand hygiene: hand hygiene performed prior to central venous catheter insertion  Location details: left internal jugular  Patient position: reverse Trendelenburg  Catheter type: triple lumen  Catheter size: 7 Fr  Pre-procedure: landmarks identified  Ultrasound guidance: yes  Number of attempts: 1  Successful placement: yes  Post-procedure: line sutured  Assessment: blood return through all parts and no pneumothorax on x-ray  Patient tolerance: Patient tolerated the procedure well with no immediate complications.      Results     Procedure Component Value Units Date/Time    Urinalysis with microscopic [[id number]]  (Abnormal) Collected:04/24/13 1633    Specimen Information:Urine Updated:04/24/13 1719     Urine Type Catheterized, F      Color, UA YELLOW      Clarity, UA CLOUDY (A)      Specific Gravity UA 1.030      Urine pH 5.5      Leukocyte Esterase, UA NEGATIVE      Nitrite, UA NEGATIVE      Protein, UR 100 (A)      Glucose, UA 100 (A)      Ketones UA NEGATIVE      Urobilinogen, UA 0.2 mg/dL      Bilirubin, UA SMALL (A)      Blood, UA MODERATE (A)      RBC, UA 6 - 10 (A)      WBC, UA 6 - 10 (A)      Squamous Epithelial Cells, Urine 0 - 5      Urine Bacteria Few (A)      Granular Casts, UA TNTC (A)     Mycoplasma pneumoniae Ab (IgG,IgM),EIA [[id number]] Collected:04/24/13 1634     Updated:04/24/13 1701    Chlamydia antibodies [[id number]] Collected:04/24/13 1635    Specimen Information:Blood Updated:04/24/13 1701    S. PNEUMONIAE, RAPID URINARY ANTIGEN [[id number]] Collected:04/24/13 1637    Specimen Information:Urine, Clean Catch Updated:04/24/13 1637    MRSA culture [[id number]] Collected:04/24/13 1637    Specimen Information:Body Fluid / Nares Updated:04/24/13 1637    MRSA culture [[id number]] Collected:04/24/13 1634    Specimen Information:Body Fluid / Throat Updated:04/24/13 1635    Respiratory Culture [[id number]] Collected:04/24/13 1634     Specimen Information:Sputum / Endotracheal Aspirate Updated:04/24/13 1635    Legionella antigen, urine [  [id number]] Collected:04/24/13 1633    Specimen Information:Urine / Urine, Clean Catch Updated:04/24/13 1634    Respiratory Culture [[id number]] Collected:04/24/13 1626    Specimen Information:Sputum / Sputum, Suctioned Updated:04/24/13 1627    Urine Culture [[id number]] Collected:04/24/13 1224    Specimen Information:Urine / Urine, Catheterized, Foley Updated:04/24/13 1614    Blood Culture Aerobic/Anaerobic #1 [[id number]] Collected:04/24/13 1121    Specimen Information:Blood, Venipuncture Updated:04/24/13 1443    Narrative:    1 BLUE+1 PURPLE    Blood Culture Aerobic/Anaerobic #2 [[id number]] Collected:04/24/13 1103    Specimen Information:Blood, Venipuncture Updated:04/24/13 1443    Narrative:    1 BLUE+1 PURPLE    Urinalysis [[id number]]  (Abnormal) Collected:04/24/13 1224    Specimen Information:Urine Updated:04/24/13 1401     Urine Type Catheterized, F      Color, UA YELLOW      Clarity, UA SL CLOUDY (A)      Specific Gravity UA 1.025      Urine pH 6.0      Leukocyte Esterase, UA NEGATIVE      Nitrite, UA NEGATIVE      Protein, UR 100 (A)      Glucose, UA NEGATIVE      Ketones UA TRACE      Urobilinogen, UA 0.2 mg/dL      Bilirubin, UA NEGATIVE      Blood, UA LARGE (A)     Microscopic, Urine [[id number]]  (Abnormal) Collected:04/24/13 1224     RBC, UA 0 - 5 Updated:04/24/13 1401     WBC, UA 0 - 5      Squamous Epithelial Cells, Urine 0 - 5      Urine Bacteria Few (A)     Arterial Blood Gas (ABG) [[id number]]  (Abnormal) Collected:04/24/13 1224     ABG CollectionSite Left Radl Updated:04/24/13 1230     Allen's Test No PT NOT ABLE      Temperature 98.6      pH, Arterial 7.18 (LL)      pCO2, Arterial 68.0 (HH)      pO2, Arterial 103.0 (H)      HCO3, Arterial 25.4 mEq/L      Base Excess, Arterial -4.1 (L) mEq/L      O2 Sat, Arterial 96.0 %      FIO2 100.0 %      Mode: PRVC      Rate 24      Tidal vol. 400      PEEP 8.0      Troponin I [[id number]] Collected:04/24/13 1103    Specimen Information:Blood Updated:04/24/13 1141     Troponin I 0.01 ng/mL     B-type Natriuretic Peptide (BNP) [[id number]]  (Abnormal) Collected:04/24/13 1103    Specimen Information:Blood Updated:04/24/13 1141     B-Natriuretic Peptide 494.6 (H) pg/mL     CBC with differential [[id number]]  (Abnormal) Collected:04/24/13 1103    Specimen Information:Blood / Blood Updated:04/24/13 1135     WBC 18.31 (H) x10 3/uL      RBC 5.01 x10 6/uL      Hgb 14.5 g/dL      Hematocrit 25.4 %      MCV 88.0 fL      MCH 28.9 pg      MCHC 32.9 g/dL      RDW 15 %      Platelets 134 (L) x10 3/uL      MPV 11.6 fL     Manual Differential [[id number]]  (Abnormal) Collected:04/24/13 1103     Segmented Neutrophils 46 % Updated:04/24/13 1135  Band Neutrophils 43 %      Lymphocytes Manual 8 %      Monocytes Manual 1 %      Eosinophils Manual 1 %      Basophils Manual 1 %      Nucleated RBC 0      Abs Seg Manual 8.42 (H) x10 3/uL      Bands Absolute 7.87 (H) x10 3/uL      Absolute Lymph Manual 1.46 x10 3/uL      Monocytes Absolute 0.18 x10 3/uL      Absolute Eos Manual 0.18 x10 3/uL      Absolute Baso Manual 0.18 x10 3/uL     Cell MorpHology [[id number]]  (Abnormal) Collected:04/24/13 1103     Cell Morphology: Abnormal (A) Updated:04/24/13 1135     Polychromasia =1+ (A)      Toxic Vacuolation =1+ (A)      Platelet Clumps Present (A)      Platelet Estimate Normal     Comprehensive metabolic panel [[id number]]  (Abnormal) Collected:04/24/13 1103    Specimen Information:Blood Updated:04/24/13 1134     Glucose 155 (H) mg/dL      BUN 74.2 (H) mg/dL      Creatinine 1.0 mg/dL      Sodium 595 mEq/L      Potassium 3.4 (L) mEq/L      Chloride 103 mEq/L      CO2 20 (L) mEq/L      CALCIUM 9.4 mg/dL      Protein, Total 7.0 g/dL      Albumin 3.4 (L) g/dL      AST (SGOT) 42 (H) U/L      ALT 44 U/L      Alkaline Phosphatase 58 U/L      Bilirubin, Total 1.4 (H) mg/dL      Globulin 3.6 g/dL      Albumin/Globulin  Ratio 0.9      Anion Gap 20.0 (H)     CK with MB if indicated [[id number]] Collected:04/24/13 1103    Specimen Information:Blood Updated:04/24/13 1134     Creatine Kinase (CK) 150 U/L     GFR [[id number]] Collected:04/24/13 1103     EGFR >60.0 Updated:04/24/13 1134    D-Dimer [[id number]]  (Abnormal) Collected:04/24/13 1103     D-Dimer 1.35 (H) Updated:04/24/13 1129    Lactic Acid [[id number]]  (Abnormal) Collected:04/24/13 1104    Specimen Information:Blood Updated:04/24/13 1128     Lactic acid 7.0 (HH) mmol/L         Radiology Results (24 Hour)     Procedure Component Value Units Date/Time    Chest AP Only [[id number]] Collected:04/24/13 1304    Order Status:Completed  Updated:04/24/13 1313    Narrative:    CLINICAL HISTORY: Intubation.    Comparison: 04/24/2013 1105 hours    Endotracheal tube present with tip approximately 3.0 cm above carina. NG  tube present passing into stomach with tip not visualized on this  radiograph. Left internal jugular central venous catheter with tip not  well seen, probably in left innominate vein. Heart size within normal  limits. Mediastinum not remarkable. There is increased patchy density  throughout both lungs, greater on the right not significantly changed.  Again seen is greater density in the right upper lobe with no air  bronchograms. As discussed before, underlying mass not excluded. Right  costophrenic angle clear. Left costophrenic angle grossly clear, not  completely visualized. Probable pulmonary vascular congestion. No  evidence of pneumothorax.      Impression:  1. Normal heart size with probable pulmonary vascular congestion.  2. Diffuse bilateral airspace disease, greater on the right. Greater  density in right upper lobe noted. Underlying mass not excluded.  Recommend follow-up radiographs.  3. No pneumothorax.    Pennelope Bracken, MD   04/24/2013 1:09 PM    Chest AP Portable [[id number]] Collected:04/24/13 1112    Order Status:Completed  Updated:04/24/13 1137     Narrative:    CLINICAL HISTORY: Respiratory distress.    No comparison radiographs.    Heart size within normal limits. Mediastinum not remarkable allowing for  technique and mild rotation. There is dense consolidation in right mid  and lower lung zones. There is increased density right upper lobe which  abuts the right minor pleural fissure. This is likely consolidation  although no air bronchograms are identified. Other etiology such as  right upper lobe mass not excluded. Increased patchy density left  perihilar region and left base. Costophrenic angles are clear. No  pneumothorax.      Impression:     Bilateral airspace disease, greater on the right consistent  with pneumonitis, ARDS, or edema. There is greater increased density  right upper lobe. Underlying mass not excluded. Recommend follow-up  radiographs.    Results of the study were discussed with Dr. Cassandria Santee at 11:32 AM  04/24/2013 .    Pennelope Bracken, MD   04/24/2013 11:32 AM            Clinical Impression & Disposition     Clinical Impression  Final diagnoses:   Respiratory failure   Pneumonia        ED Disposition     Admit Bed Type: ICU [10]  Admitting Physician: Lawernce Ion [[id number]  Patient Class: Inpatient [101]             New Prescriptions    No medications on file                 Toni Arthurs, DO  04/24/13 1705    Toni Arthurs, DO  04/24/13 1726

## 2013-04-24 NOTE — Progress Notes (Signed)
Daily Progress Note    Date Time: 04/24/2013 7:20 PM  Patient Name: Adam Simmons  Attending Physician: Lawernce Ion, MD  Room: IC05/IC05-A   Admit Date: 04/24/2013  LOS: 0 days        Assessment/Plan:   #Neuro: sedated. RASS -5. Propofol, versed, and fentanyl drips.     #Cardio: ST. BP low-Neo synephrine started to maintain MAP >65. 3L NS given. Febrile-tylenol given-no changes- cooling blanket applied-temperature decreasing.    #Resp: RR increased and unable to control RR. Paralytic given per dr. Nedra Hai. Vent settings changed.      #GI: rounded, distention- bowel regimen given. No BM    #Infectious Disease (ID): antibiotics given per Dr. Janalyn Rouse.           #Renal /Fluid, Electrolytes : foley. Urine specimens sent.    #Endo: accuchecks-coverage given.    #Skin: abrasions and bruising noted-WOC consulted.    #Nutrition: NPO    #Lines: 2PIV, CVC    #Prophylaxis:   GI Prophylaxis:  pepcid  VTE Prophylaxis: lovenox and SCDs.    Admitted from [facility] to ICU bed 5 at 1515 on 04/24/2013. Pt's BP low on sedation on arrival- NS bolus given with minimal result. Dr. Nedra Hai at bedside. Pt's RR >35-paralytic given and neo synephrine drip started to maintain BP. Pt sedated increased to help with ventilator compliance. Pressor-increase BP. Pt's RR and compliance to the ventilator is better per Dr. Nedra Hai.   Pt's wife (Edsel Shives) updated via phone by RN. Wife is driving [facility] Dr. Nedra Hai has wife's contact information to update her.     Medications:   Scheduled Meds:  Current Facility-Administered Medications   Medication Dose Route Frequency   . [COMPLETED] albuterol  2.5 mg Nebulization Once   . [COMPLETED] albuterol  2.5 mg Nebulization Once   . albuterol-ipratropium  3 mL Nebulization Q4H SCH   . [COMPLETED] azithromycin  500 mg Intravenous Once   . [COMPLETED] cefTRIAXone  1 g Intravenous Once   . chlorhexidine  15 mL Mouth/Throat BID   . docusate  100 mg Oral BID   . enoxaparin  40 mg Subcutaneous Daily   . famotidine  20 mg per OG tube  BID   . insulin aspart  1-8 Units Subcutaneous Q4H   . [COMPLETED] ipratropium  0.5 mg Nebulization Once   . lactobacillus/streoptococcus  1 capsule Oral Daily   . levofloxacin  750 mg Intravenous Daily   . [COMPLETED] LORazepam       . [COMPLETED] methylprednisolone  125 mg Intravenous Once   . piperacillin-tazobactam  4.5 g Intravenous Q8H SCH   . polyethylene glycol  17 g Oral BID   . [COMPLETED] propofol       . [COMPLETED] propofol       . [COMPLETED] sodium chloride  1,000 mL Intravenous Once   . [COMPLETED] sodium chloride  1,000 mL Intravenous Once   . [COMPLETED] sodium chloride  1,000 mL Intravenous Once   . thiamine  100 mg Oral Daily   . [START ON 04/25/2013] vancomycin  1,000 mg Intravenous Q8H SCH   . [COMPLETED] vancomycin  2,500 mg Intravenous Once   . [COMPLETED] vecuronium       . [DISCONTINUED] piperacillin-tazobactam  4.5 g Intravenous [facility]         Continuous Infusions:       . sodium chloride 150 mL/hr (04/24/13 1707)   . fentaNYL 200 mcg/hr (04/24/13 1814)   . midazolam (VERSED) infusion 20 mg/hr (04/24/13 1814)   .  phenylephrine 300 mcg/min (04/24/13 1847)   . propofol 20 mcg/kg/min (04/24/13 1828)        Data:   Invasive ICU Hemodynamics:    Invasive Hemodynamic Monitoring  CVP (mmHg): 22 mmHg     Vent Settings:    Vent Settings  Vent Mode: PRVC  FiO2: 70 %  Resp Rate (Set): 28   Vt (Set, mL): 380 mL  PIP Observed (cm H2O): 32 cm H2O  PEEP/EPAP: 14 cm H20  Pressure Support / IPAP: 10 cmH20  Mean Airway Pressure: 20 cmH20        Labs:   Labs (last 72 hours):  Recent Labs   Basename 04/24/13 1103    WBC 18.31*    HGB 14.5    HCT 44.1    LABPLAT --     No results found for this basename: PT:3,INR:3,PTT:3 in the last 72 hours Recent Labs   Basename 04/24/13 1103    NA 143    K 3.4*    CL 103    CO2 20*    BUN 22.0*    CREAT 1.0    GLU 155*    CA 9.4    MG --    PHOS --

## 2013-04-24 NOTE — Consults (Signed)
Service Date: 04/24/2013     Patient Type: I     CONSULTING PHYSICIAN: Alfonzo Beers MD     REFERRING PHYSICIAN: Lawernce Ion MD     REASON FOR CONSULTATION:  Respiratory failure status post intubation, pneumonia.     HISTORY OF PRESENT ILLNESS:  [name] is a 48 year old male who is intubated and sedated at  present.  His history is obtained from the records.  According to the  record a history of hypertension, diabetes mellitus, kidney stones, was  brought by the EMS with CPAP on, significantly tachypneic.  According to  the ER notes, he was complaining of something, fluid in his chest, which is  progressively getting worse over the days.  He had a significant  leukocytosis with significant bandemia and he was intubated shortly.  His  chest x-ray revealed diffuse bilateral airspace disease, greater on the  right upper lobe, most likely aspiration.  He received a dose of Rocephin  and Zithromax in the emergency room.  There is no skin break.  Currently he  is intubated and sedated with a central line in place.     REVIEW OF SYSTEMS:  There is no documentation of any chest pain, hematemesis, hemoptysis,  melena, any GU symptoms.  Other review of systems is unavailable.     ALLERGIES:  No known drug allergies.     PAST MEDICAL HISTORY:  According to the records, hypertension, diabetes mellitus, kidney stones.     SOCIAL HISTORY:  According to the records, is a smoker and drinker.     FAMILY HISTORY:  Unavailable.     PHYSICAL EXAMINATION:  GENERAL:  [name] is a 48 year old male who is intubated and  sedated, with significant leukocytosis and lactic acidosis.  VITAL SIGNS:  Temperature current is 98.4, pulse 130, respiratory rate of  24, blood pressure 118/71.  HEENT:  Pallor negative.  Anicteric, intubated.  RESPIRATORY:  Decreased breath sounds bilaterally, bilateral scattered  crackles, expiratory wheezing.  ABDOMEN:  Soft.  Distended, bowel sounds are positive.  NEUROLOGIC:  Intubated and  sedated.  SKIN AND EXTREMITIES:  There are no rashes.     DIAGNOSTIC STUDIES:  WBC count is 18.3, hemoglobin 14.5, hematocrit 44.1, platelet 134,  segmented neutrophils 46, bands 43, glucose 155.  BUN 22.0, creatinine 1,  sodium 143, potassium 3.4, chloride 103, CO2 20, AST 42, ALT 44, alkaline  phosphatase 58.  BNP 494.  Lactic acid 7.  Urinalysis:  Negative leukocyte  esterase, negative nitrates.  Blood cultures are pending.  Chest x-ray  reveals diffuse bilateral air space disease, especially in the right upper  lobe.     ASSESSMENT:  [name] is a 48 year old male with:  1.  Respiratory failure, status post intubation.  2.  Sepsis syndrome.  3.  Bilateral pneumonia.  4.  Possible aspiration.  5.  History of hypertension.  6.  Diabetes mellitus.     RECOMMENDATIONS:  I would like to suggest the following approach:  1.  Zosyn 4.5 grams IV every 8 hours for broader coverage including the  coverage for anaerobes for possible aspiration.  2.  Levaquin 750 mg IV daily for added gram-negative coverage as well as  atypical coverage.  3.  Vancomycin 1 gram IV q.8 h. for coverage of resistant gram-positives,  considering the unknown history.  4.  Sputum Gram stain and cultures.  5.  Urinary Legionella and Streptococcus pneumoniae antigen.  6.  Chlamydia antibodies panel.  7.  Mycoplasma IgM, IgG.    8.  Continue ventilatory support.    9.  CT scan of the chest once stable for further delineation of the lung  infiltrates, possible mass.    10.  Correction of electrolytes and IV hydration.    11.  Will adjust his antibiotics based on his clinical course and culture  results.     I will follow this patient closely with you.     Thank you, Arline Asp, for involving me in the care of [name].           D:  04/24/2013 17:37 PM by Dr. Fredderick Phenix A. Janalyn Rouse, MD [id number])  T:  04/24/2013 18:34 PM by       Everlean Cherry: [id number]) (Doc ID: [id number])

## 2013-04-25 ENCOUNTER — Inpatient Hospital Stay: Payer: BC Managed Care – PPO

## 2013-04-25 ENCOUNTER — Encounter: Payer: Self-pay | Admitting: Internal Medicine

## 2013-04-25 DIAGNOSIS — E119 Type 2 diabetes mellitus without complications: Secondary | ICD-10-CM | POA: Diagnosis present

## 2013-04-25 DIAGNOSIS — N179 Acute kidney failure, unspecified: Secondary | ICD-10-CM | POA: Diagnosis present

## 2013-04-25 LAB — COMPREHENSIVE METABOLIC PANEL
ALT: 126 U/L — ABNORMAL HIGH (ref 0–55)
ALT: 125 U/L — ABNORMAL HIGH (ref 0–55)
AST (SGOT): 191 U/L — ABNORMAL HIGH (ref 5–34)
AST (SGOT): 114 U/L — ABNORMAL HIGH (ref 5–34)
Albumin/Globulin Ratio: 0.8 — ABNORMAL LOW (ref 0.9–2.2)
Albumin/Globulin Ratio: 0.7 — ABNORMAL LOW (ref 0.9–2.2)
Albumin: 2.3 g/dL — ABNORMAL LOW (ref 3.5–5.0)
Albumin: 2.1 g/dL — ABNORMAL LOW (ref 3.5–5.0)
Alkaline Phosphatase: 49 U/L (ref 40–150)
Alkaline Phosphatase: 48 U/L (ref 40–150)
Anion Gap: 12 (ref 5.0–15.0)
Anion Gap: 12 (ref 5.0–15.0)
BUN: 37 mg/dL — ABNORMAL HIGH (ref 9.0–21.0)
BUN: 36.5 mg/dL — ABNORMAL HIGH (ref 9.0–21.0)
Bilirubin, Total: 2 mg/dL — ABNORMAL HIGH (ref 0.2–1.2)
Bilirubin, Total: 1.4 mg/dL — ABNORMAL HIGH (ref 0.2–1.2)
CO2: 20 mEq/L — ABNORMAL LOW (ref 22–29)
CO2: 24 mEq/L (ref 22–29)
Calcium: 7 mg/dL — ABNORMAL LOW (ref 8.5–10.5)
Calcium: 7.2 mg/dL — ABNORMAL LOW (ref 8.5–10.5)
Chloride: 111 mEq/L — ABNORMAL HIGH (ref 98–107)
Chloride: 112 mEq/L — ABNORMAL HIGH (ref 98–107)
Creatinine: 1.6 mg/dL — ABNORMAL HIGH (ref 0.7–1.3)
Creatinine: 2 mg/dL — ABNORMAL HIGH (ref 0.7–1.3)
Globulin: 2.9 g/dL (ref 2.0–3.6)
Globulin: 3 g/dL (ref 2.0–3.6)
Glucose: 321 mg/dL — ABNORMAL HIGH (ref 70–100)
Glucose: 308 mg/dL — ABNORMAL HIGH (ref 70–100)
Potassium: 4 mEq/L (ref 3.5–5.1)
Potassium: 3.6 mEq/L (ref 3.5–5.1)
Protein, Total: 5.2 g/dL — ABNORMAL LOW (ref 6.0–8.3)
Protein, Total: 5.1 g/dL — ABNORMAL LOW (ref 6.0–8.3)
Sodium: 143 mEq/L (ref 136–145)
Sodium: 148 mEq/L — ABNORMAL HIGH (ref 136–145)

## 2013-04-25 LAB — CBC
Hematocrit: 38.6 % — ABNORMAL LOW (ref 42.0–52.0)
Hgb: 12 g/dL — ABNORMAL LOW (ref 13.0–17.0)
MCH: 27.9 pg — ABNORMAL LOW (ref 28.0–32.0)
MCHC: 31.1 g/dL — ABNORMAL LOW (ref 32.0–36.0)
MCV: 89.8 fL (ref 80.0–100.0)
MPV: 12.2 fL (ref 9.4–12.3)
Platelets: 191 10*3/uL (ref 140–400)
RBC: 4.3 10*6/uL — ABNORMAL LOW (ref 4.70–6.00)
RDW: 16 % — ABNORMAL HIGH (ref 12–15)
WBC: 26.09 10*3/uL — ABNORMAL HIGH (ref 3.50–10.80)

## 2013-04-25 LAB — MAN DIFF ONLY
Band Neutrophils Absolute: 7.75 10*3/uL — ABNORMAL HIGH (ref 0.00–1.00)
Band Neutrophils: 39 %
Basophils Absolute Manual: 0 10*3/uL (ref 0.00–0.20)
Basophils Manual: 0 %
Eosinophils Absolute Manual: 0 10*3/uL (ref 0.00–0.70)
Eosinophils Manual: 0 %
Lymphocytes Absolute Manual: 0.4 10*3/uL — ABNORMAL LOW (ref 0.50–4.40)
Lymphocytes Manual: 2 %
Metamyelocytes Absolute: 0.2 10*3/uL — ABNORMAL HIGH
Metamyelocytes: 1 %
Monocytes Absolute: 0.4 10*3/uL (ref 0.00–1.20)
Monocytes Manual: 2 %
Neutrophils Absolute Manual: 11.12 10*3/uL — ABNORMAL HIGH (ref 1.80–8.10)
Nucleated RBC: 0 (ref 0–1)
Segmented Neutrophils: 56 %

## 2013-04-25 LAB — ARTERIAL BLOOD GAS (~~LOC~~)
Base Excess, Arterial: -8.7 mEq/L — ABNORMAL LOW (ref <=2.0)
Base Excess, Arterial: -4.8 mEq/L — ABNORMAL LOW (ref <=2.0)
FIO2: 70 %
FIO2: 70 %
HCO3, Arterial: 20.1 mEq/L — ABNORMAL LOW (ref 22.0–28.0)
HCO3, Arterial: 22.8 mEq/L (ref 22.0–28.0)
O2 Sat, Arterial: 84 % — ABNORMAL LOW (ref 95.0–100.0)
O2 Sat, Arterial: 91 % — ABNORMAL LOW (ref 95.0–100.0)
PEEP: 14
PEEP: 14
Rate: 28
Rate: 28
Respiratory Rate: 28
Temperature: 99.6
Temperature: 98.3
Tidal vol.: 380
Tidal vol.: 380
pCO2, Arterial: 55 — ABNORMAL HIGH (ref 35.0–45.0)
pCO2, Arterial: 52 — ABNORMAL HIGH (ref 35.0–45.0)
pH, Arterial: 7.17 — CL (ref 7.35–7.45)
pH, Arterial: 7.25 — ABNORMAL LOW (ref 7.35–7.45)
pO2, Arterial: 62 — ABNORMAL LOW (ref 80.0–100.0)
pO2, Arterial: 72 — ABNORMAL LOW (ref 80.0–100.0)

## 2013-04-25 LAB — GFR
EGFR: 53.9
EGFR: 46.2
EGFR: 40.3
EGFR: 35.7

## 2013-04-25 LAB — BASIC METABOLIC PANEL
Anion Gap: 15 (ref 5.0–15.0)
Anion Gap: 13 (ref 5.0–15.0)
BUN: 33.7 mg/dL — ABNORMAL HIGH (ref 9.0–21.0)
BUN: 36.3 mg/dL — ABNORMAL HIGH (ref 9.0–21.0)
CO2: 17 mEq/L — ABNORMAL LOW (ref 22–29)
CO2: 23 mEq/L (ref 22–29)
Calcium: 7.1 mg/dL — ABNORMAL LOW (ref 8.5–10.5)
Calcium: 7.2 mg/dL — ABNORMAL LOW (ref 8.5–10.5)
Chloride: 112 mEq/L — ABNORMAL HIGH (ref 98–107)
Chloride: 111 mEq/L — ABNORMAL HIGH (ref 98–107)
Creatinine: 1.4 mg/dL — ABNORMAL HIGH (ref 0.7–1.3)
Creatinine: 1.8 mg/dL — ABNORMAL HIGH (ref 0.7–1.3)
Glucose: 295 mg/dL — ABNORMAL HIGH (ref 70–100)
Glucose: 317 mg/dL — ABNORMAL HIGH (ref 70–100)
Potassium: 4.2 mEq/L (ref 3.5–5.1)
Potassium: 3.8 mEq/L (ref 3.5–5.1)
Sodium: 144 mEq/L (ref 136–145)
Sodium: 147 mEq/L — ABNORMAL HIGH (ref 136–145)

## 2013-04-25 LAB — CELL MORPHOLOGY
Cell Morphology: ABNORMAL — AB
Platelet Estimate: NORMAL

## 2013-04-25 LAB — GLUCOSE WHOLE BLOOD - POCT
Whole Blood Glucose POCT: 234 mg/dL — ABNORMAL HIGH (ref 70–100)
Whole Blood Glucose POCT: 286 mg/dL — ABNORMAL HIGH (ref 70–100)
Whole Blood Glucose POCT: 280 mg/dL — ABNORMAL HIGH (ref 70–100)
Whole Blood Glucose POCT: 278 mg/dL — ABNORMAL HIGH (ref 70–100)
Whole Blood Glucose POCT: 257 mg/dL — ABNORMAL HIGH (ref 70–100)
Whole Blood Glucose POCT: 281 mg/dL — ABNORMAL HIGH (ref 70–100)

## 2013-04-25 LAB — CBC AND DIFFERENTIAL
Hematocrit: 39 % — ABNORMAL LOW (ref 42.0–52.0)
Hgb: 12.3 g/dL — ABNORMAL LOW (ref 13.0–17.0)
MCH: 28.4 pg (ref 28.0–32.0)
MCHC: 31.5 g/dL — ABNORMAL LOW (ref 32.0–36.0)
MCV: 90.1 fL (ref 80.0–100.0)
MPV: 11.9 fL (ref 9.4–12.3)
Platelets: 160 10*3/uL (ref 140–400)
RBC: 4.33 10*6/uL — ABNORMAL LOW (ref 4.70–6.00)
RDW: 16 % — ABNORMAL HIGH (ref 12–15)
WBC: 19.86 10*3/uL — ABNORMAL HIGH (ref 3.50–10.80)

## 2013-04-25 LAB — CK
Creatine Kinase (CK): 73 U/L (ref 30–200)
Creatine Kinase (CK): 283 U/L — ABNORMAL HIGH (ref 30–200)

## 2013-04-25 LAB — ECG 12-LEAD
Atrial Rate: 139 {beats}/min
P Axis: 46 degrees
P-R Interval: 128 ms
Q-T Interval: 290 ms
QRS Duration: 94 ms
QTC Calculation (Bezet): 441 ms
R Axis: 47 degrees
T Axis: 23 degrees
Ventricular Rate: 139 {beats}/min

## 2013-04-25 LAB — CHLAMYDIA ANTIBODIES
Chlamydia trachomatis, IgG: <1:64 {titer}
Chlamydia trachomatis, IgM: <1:10 {titer}
Chlamydophila pneumoniae, IgG: <1:64 {titer}
Chlamydophila pneumoniae, IgM: <1:10 {titer}
Chlamydophila psittaci , IgG: <1:64 {titer}
Chlamydophila psittaci, IgM: <1:10 {titer}

## 2013-04-25 LAB — MAGNESIUM
Magnesium: 1.8 mg/dL (ref 1.6–2.6)
Magnesium: 1.8 mg/dL (ref 1.6–2.6)
Magnesium: 1.9 mg/dL (ref 1.6–2.6)

## 2013-04-25 LAB — TROPONIN I
Troponin I: 0.01 ng/mL (ref 0.00–0.09)
Troponin I: 0.12 ng/mL — ABNORMAL HIGH (ref 0.00–0.09)

## 2013-04-25 LAB — LACTIC ACID, PLASMA
Lactic Acid: 5.3 mmol/L (ref 0.7–2.1)
Lactic Acid: 2.8 mmol/L — ABNORMAL HIGH (ref 0.7–2.1)
Lactic Acid: 2.6 mmol/L — ABNORMAL HIGH (ref 0.7–2.1)
Lactic Acid: 2.2 mmol/L — ABNORMAL HIGH (ref 0.7–2.1)

## 2013-04-25 LAB — CKMB: Creatinine Kinase MB (CKMB): 3.1 ng/mL (ref 0.0–4.9)

## 2013-04-25 LAB — PHOSPHORUS: Phosphorus: 3.1 mg/dL (ref 2.3–4.7)

## 2013-04-25 MED ORDER — SODIUM BICARBONATE 8.4 % IV SOLN
100.0000 mL/h | INTRAVENOUS | Status: DC
Start: 2013-04-25 — End: 2013-04-26
  Administered 2013-04-25 – 2013-04-26 (×2): 100 mL/h via INTRAVENOUS
  Filled 2013-04-25 (×2): qty 1000

## 2013-04-25 MED ORDER — METHYLPREDNISOLONE SODIUM SUCC 40 MG IJ SOLR
20.0000 mg | Freq: Three times a day (TID) | INTRAMUSCULAR | Status: DC
Start: 2013-04-25 — End: 2013-04-25

## 2013-04-25 MED ORDER — LEVOFLOXACIN IN D5W 750 MG/150ML IV SOLN
750.0000 mg | INTRAVENOUS | Status: DC
Start: 2013-04-26 — End: 2013-05-07
  Administered 2013-04-26 – 2013-05-06 (×6): 750 mg via INTRAVENOUS
  Filled 2013-04-25 (×6): qty 150

## 2013-04-25 MED ORDER — METHYLPREDNISOLONE SODIUM SUCC 40 MG IJ SOLR
20.0000 mg | Freq: Two times a day (BID) | INTRAMUSCULAR | Status: DC
Start: 2013-04-25 — End: 2013-04-30
  Administered 2013-04-25 – 2013-04-30 (×10): 20 mg via INTRAVENOUS
  Filled 2013-04-25 (×10): qty 1

## 2013-04-25 MED ORDER — VANCOMYCIN HCL 1000 MG IV SOLR
1000.0000 mg | Freq: Two times a day (BID) | INTRAVENOUS | Status: DC | PRN
Start: 2013-04-25 — End: 2013-04-28

## 2013-04-25 MED ORDER — INSULIN GLARGINE 100 UNIT/ML SC SOLN
15.0000 [IU] | Freq: Every evening | SUBCUTANEOUS | Status: DC
Start: 2013-04-25 — End: 2013-04-27
  Administered 2013-04-25 – 2013-04-26 (×2): 15 [IU] via SUBCUTANEOUS
  Filled 2013-04-25 (×2): qty 150

## 2013-04-25 NOTE — PT Eval Note (Signed)
Physical Therapy Cancellation Note    Patient: Adam Simmons  ZOX:[id number]    Unit: IC05/IC05-A    Patient not seen for physical therapy secondary to pt not appropriate at this time due to high vent settings (PEEP 14, FiO2 80%). Will follow up as appropriate.      Delfin Edis, PT, DPT  Pager #: [phone]

## 2013-04-25 NOTE — Progress Notes (Signed)
[facility]- Critical Care Note     ICU Daily Progress Note        Date Time: 04/25/2013 5:52 PM  Patient Name: Adam Simmons  Attending Physician: Lawernce Ion, MD  Room: IC05/IC05-A   Admit Date: 04/24/2013  LOS: 1 day            Assessment:     Patient Active Problem List   Diagnosis   . Respiratory failure   . Pneumonia   . Hypokalemia   . ARDS (adult respiratory distress syndrome)   Acute kidney injury  Diabetes mellitus, uncontrolled  HTN  Sepsis, strep bacteremia  Day 2 mech ventilation    Echo= nl LV    Plan:   Abx, iv bicarb fluids, insulin, vent support, enteral feedings  Decrease steroids    Subjective:   Sedated on vent    Medications:       Scheduled Meds: PRN Meds:         [COMPLETED] albuterol 2.5 mg Nebulization Once   albuterol-ipratropium 3 mL Nebulization Q4H SCH   chlorhexidine 15 mL Mouth/Throat BID   docusate 100 mg Oral BID   enoxaparin 40 mg Subcutaneous Daily   famotidine 20 mg per OG tube BID   insulin aspart 1-8 Units Subcutaneous Q4H   insulin glargine 15 Units Subcutaneous QHS   lactobacillus/streoptococcus 1 capsule Oral Daily   [START ON 04/26/2013] levofloxacin 750 mg Intravenous Q48H   methylprednisolone 20 mg Intravenous Q8H   piperacillin-tazobactam 4.5 g Intravenous Q8H SCH   polyethylene glycol 17 g Oral BID   thiamine 100 mg Oral Daily   [COMPLETED] vecuronium 10 mg Intravenous Once   [DISCONTINUED] levofloxacin 750 mg Intravenous Daily   [DISCONTINUED] methylprednisolone 40 mg Intravenous Q6H   [DISCONTINUED] oseltamivir 75 mg Oral BID   [DISCONTINUED] oseltamivir 75 mg Oral BID   [DISCONTINUED] vancomycin 1,000 mg Intravenous Q8H SCH       Continuous Infusions:       . IV fluids with sodium bicarbonate 150 mL/hr (04/25/13 0746)   . fentaNYL 200 mcg/hr (04/25/13 1746)   . midazolam (VERSED) infusion 20 mg/hr (04/25/13 1414)   . phenylephrine 200 mcg/min (04/25/13 1540)   . propofol 20 mcg/kg/min (04/25/13 1411)   . [DISCONTINUED] sodium chloride Stopped (04/24/13 2240)          acetaminophen 500 mg Q4H PRN   albuterol 2.5 mg Q1H PRN   dextrose 15 g PRN   dextrose 25 mL PRN   fentaNYL 25 mcg Q2H PRN   glucagon (rDNA) 1 mg PRN   LORazepam 2 mg Q1H PRN   [COMPLETED] sodium chloride 1,000 mL PRN   vancomycin 1,000 mg Q12H PRN   vecuronium 10 mg Q6H PRN             Physical Exam:     Filed Vitals:    04/25/13 1615 04/25/13 1630 04/25/13 1645 04/25/13 1700   BP:    113/77   Pulse: 103 102 100 99   Temp:       TempSrc:       Resp:    28   Height:       Weight:       SpO2: 99% 99% 97% 98%     Temp (24hrs), Avg:98.8 F (37.1 C), Min:97.1 F (36.2 C), Max:100.3 F (37.9 C)           02/03 0701 - 02/04 0700  In: 24401 [I.V.:7939]  Out: 2225 [Urine:1475]  General Appearance: obese, on vent  Mental status: sedated  Neuro:  Moves ext  H & N: no jvd  Lungs: bilat rales R>L  Cardiac: reg  Abdomen:  soft  Extremities: trace edema  Skin: warm      Data:       Vent Settings:    Vent Settings  Vent Mode: PRVC  FiO2: 70 %  Resp Rate (Set): 28   Vt (Set, mL): 380 mL  PIP Observed (cm H2O): 31 cm H2O  PEEP/EPAP: 14 cm H20  Pressure Support / IPAP: 10 cmH20  Mean Airway Pressure: 20 cmH20      Labs:     Recent CBC   Lab 04/25/13 0726 04/25/13 0219 04/24/13 1103   WBC 19.86* 26.09* 18.31*   RBC 4.33* 4.30* 5.01   HGB 12.3* 12.0* 14.5   HCT 39.0* 38.6* 44.1   MCV 90.1 89.8 88.0   PLT 160 191 134*         Lab 04/25/13 1401 04/25/13 0730 04/25/13 0219 04/25/13 0117 04/24/13 1759 04/24/13 1103   NA 147* 143 144 -- -- --   K 3.8 4.0 4.2 -- -- --   CL 111* 111* 112* -- -- --   CO2 23 20* 17* -- -- --   GLU 317* 321* 295* -- -- --   BUN 36.3* 37.0* 33.7* -- -- --   CREAT 1.8* 1.6* 1.4* -- -- --   MG 1.8 -- 1.8 -- 1.6 --   PHOS -- -- -- -- -- --   AST -- 191* -- -- -- 42*   ALT -- 126* -- -- -- 44   ALKPHOS -- 49 -- -- -- 58   BILITOTAL -- 2.0* -- -- -- 1.4*   BILIDIRECT -- -- -- -- -- --   LIP -- -- -- -- -- --   BNP -- -- -- -- -- 494.6*   PT -- -- -- -- -- --   INR -- -- -- -- -- --   PTT -- -- -- -- --  --   DDIMER -- -- -- -- -- 1.35*   CK 283* -- 73 -- 101 --   CKMB 3.1 -- -- -- -- --   TROPI 0.12* -- -- 0.01 0.02 --   MYOGLOBIN -- -- -- -- -- --           Rads:     Radiology Results (24 Hour)     ** No Results found for the last 24 hours. **          Radiological Imaging personally reviewed.    I have personally reviewed the patient's history and 24 hour interval events, along with vitals, labs, radiology images and  ventilator settings and additional findings found in detail within ICU team notes, with their care plans developed with and reviewed by me.         Signed by: Durward Fortes, MD  Date/Time: 04/25/2013 5:52 PM

## 2013-04-25 NOTE — Consults (Signed)
Nutritional Support Services  Nutrition Assessment    Adam Simmons 48 y.o. male   MRN: [id number]    Referral Source: consult  Reason for Referral: TF recommendations    Nutrition Summary/Diet history:  Pt is vented and sedated.    Nutrition Diagnosis:   Inadequate oral intake related to respiratory failure as evidenced by vent support.    Intervention:  1. Order written to start Promote to goal 50 mL/hr = 1200 kcals, 75 g pro and 1006 mL free water  2. Add ProSource NoCarb 1 packet q 6 hrs = 240 kcals and 60 g pro (TF+ProSource+ Propofol = 1841 kcals/d)  3. Free water flush per MD.     Goal: Meet estimated needs with enteral nutrition support while vented.    Monitoring:   Evaluation:   1. Residuals < 500 mL Once TF initiated  2. Weights   Morbid obese  3. Net I&O   + 10.1 L since admission    Nutrition risk level: High    Assessment Data:  Adm dx:  Respiratory failure   Patient Active Problem List   Diagnosis   . Respiratory failure   . Pneumonia   . Hypokalemia   . ARDS (adult respiratory distress syndrome)   PMH:  has a past medical history of Hypertension; Diabetes mellitus; and Kidney stones.  PSH:  has past surgical history that includes Kidney stone surgery.  Pertinent labs:  Lab 04/25/13 0730 04/25/13 0219 04/24/13 1759 04/24/13 1103   NA 143 144 -- 143   K 4.0 4.2 -- 3.4*   CL 111* 112* -- 103   CO2 20* 17* -- 20*   BUN 37.0* 33.7* -- 22.0*   CREAT 1.6* 1.4* -- 1.0   GLU 321* 295* -- 155*   CA 7.0* 7.1* -- 9.4   MG -- 1.8 1.6 --   PHOS -- -- -- --   EGFR 46.2 53.9 -- >60.0   Blood glucose range (48 hrs) 155-321 mg/dL  Pertinent meds: chlorhexidine, docusate, famotidine, insulin aspart, lactobacillus/streoptococcus, methylprednisolone, polyethylene glycol, thiamine, fentanyl, versed, Propofol @ 15.2 mL/hr = 401 kcals/d.  Social history:  married  Food Intolerance/Religious/Ethnic preferences: none noted  Diet Order:  Orders Placed This Encounter   Procedures   . Diet NPO effective now   . Prosource no carb  Frequency:: All meals; Quantity:: A. One One packet q 6 hrs.   . Tube feeding-CONTINUOUS   GI symptoms: abd distended  Hydration: non pitting edema  Skin: Abrasions  Anthropometrics  Height: 172.7 cm (5\' 8" )  Weight: 126.7 kg (279 lb 5.2 oz)  Weight Change: 11.73   IBW/kg (Calculated) Male: 70.02 kg  IBW/kg (Calculated) Male: 63.62 kg  BMI (calculated): 42.6   Wt Readings from Last 30 Encounters:   04/24/13 126.7 kg (279 lb 5.2 oz)   Learning Needs: no  Estimated Needs:  Estimated Energy Needs  Total Energy Estimated Needs: 1680-1820 kcals/kg  Method for Estimating Needs: 24-26 kcals/kg IBW  Estimated Protein Needs  Total Protein Estimated Needs: 140 g pro/kg  Method for Estimating Needs: 2 g protein/kg  Fluid Needs  Method for Estimating Needs: per MD  No Known Allergies    Geryl Councilman, RD, CNSC, CSO  Spectralink - [phone]

## 2013-04-25 NOTE — Progress Notes (Signed)
[facility], [employer].  Progress Note    Assessment:  1.AKI- non-oliguric  2. Pneumonia with ARDS- on ventilator  3. Septic shock on pressors  4. Severe acidosis - combined respiratory and metabolic  5. DM on Metformin  6. HTN  7. Kidney stones  8. Exposure to NSAIDs prior to admission    Plan:  1.Ventilator support  2. Hemodynamic support  3. IV bicarb  4. Daily labs  5. Adjust meds based on GFR < 30    Adam Drape, MD  Office - [phone]  ++++++++++++++++++++++++++++++++++++++++++++++++++++++++++++++  Subjective:  Intubated on vent    Medications:  Scheduled Meds:  Current Facility-Administered Medications   Medication Dose Route Frequency   . [COMPLETED] albuterol  2.5 mg Nebulization Once   . [COMPLETED] albuterol  2.5 mg Nebulization Once   . albuterol-ipratropium  3 mL Nebulization Q4H SCH   . [COMPLETED] azithromycin  500 mg Intravenous Once   . [COMPLETED] cefTRIAXone  1 g Intravenous Once   . chlorhexidine  15 mL Mouth/Throat BID   . docusate  100 mg Oral BID   . enoxaparin  40 mg Subcutaneous Daily   . famotidine  20 mg per OG tube BID   . insulin aspart  1-8 Units Subcutaneous Q4H   . [COMPLETED] ipratropium  0.5 mg Nebulization Once   . lactobacillus/streoptococcus  1 capsule Oral Daily   . levofloxacin  750 mg Intravenous Daily   . [COMPLETED] LORazepam       . [COMPLETED] methylprednisolone  125 mg Intravenous Once   . methylprednisolone  40 mg Intravenous Q6H   . piperacillin-tazobactam  4.5 g Intravenous Q8H SCH   . polyethylene glycol  17 g Oral BID   . [COMPLETED] propofol       . [COMPLETED] propofol       . [COMPLETED] sodium chloride  1,000 mL Intravenous Once   . [COMPLETED] sodium chloride  1,000 mL Intravenous Once   . [COMPLETED] sodium chloride  1,000 mL Intravenous Once   . thiamine  100 mg Oral Daily   . vancomycin  1,000 mg Intravenous Q8H SCH   . [COMPLETED] vancomycin  2,500 mg Intravenous Once   . [COMPLETED] vecuronium       . [COMPLETED] vecuronium  10  mg Intravenous Once   . [DISCONTINUED] oseltamivir  75 mg Oral BID   . [DISCONTINUED] oseltamivir  75 mg Oral BID   . [DISCONTINUED] piperacillin-tazobactam  4.5 g Intravenous Q8H SCH     Continuous Infusions:     . IV fluids with sodium bicarbonate 150 mL/hr (04/25/13 0746)   . fentaNYL 200 mcg/hr (04/25/13 0706)   . midazolam (VERSED) infusion 20 mg/hr (04/25/13 0857)   . phenylephrine 220 mcg/min (04/25/13 0758)   . propofol 20 mcg/kg/min (04/25/13 0900)   . [DISCONTINUED] sodium chloride Stopped (04/24/13 2240)     PRN Meds:acetaminophen, albuterol, dextrose, dextrose, [COMPLETED] etomidate, fentaNYL, glucagon (rDNA), LORazepam, [COMPLETED] sodium chloride, [COMPLETED] succinylcholine, vecuronium    Objective:  Vital signs in last 24 hours:  Temp:  [96.4 F (35.8 C)-99.2 F (37.3 C)] 98.5 F (36.9 C)  Heart Rate:  [89-144] 94   Resp Rate:  [20-48] 28   BP: (76-225)/(49-96) 110/72 mmHg  Arterial Line BP: (73-135)/(43-76) 113/64 mmHg  FiO2:  [60 %-100 %] 70 %    Intake/Output from yesterday (07:01 - 07:00):  02/03 0701 - 02/04 0700  In: 56213 [I.V.:7939]  Out: 2225 [Urine:1475]     Physical Exam:  Gen: WD WN NAD   CV: S1 S2 N RRR   Chest: CTAB   Ab: ND NT soft no HSM +BS   Ext: No C/E    Labs:    Lab 04/25/13 0730 04/25/13 0219 04/24/13 1759 04/24/13 1103   GLU 321* 295* -- 155*   BUN 37.0* 33.7* -- 22.0*   CREAT 1.6* 1.4* -- 1.0   CA 7.0* 7.1* -- 9.4   NA 143 144 -- 143   K 4.0 4.2 -- 3.4*   CL 111* 112* -- 103   CO2 20* 17* -- 20*   ALB 2.3* -- -- 3.4*   PHOS -- -- -- --   MG -- 1.8 1.6 --       Lab 04/25/13 0726 04/25/13 0219 04/24/13 1103   WBC 19.86* 26.09* 18.31*   HGB 12.3* 12.0* 14.5   HCT 39.0* 38.6* 44.1   MCV 90.1 89.8 88.0   MCH 28.4 27.9* 28.9   MCHC 31.5* 31.1* 32.9   RDW 16* 16* 15   MPV 11.9 12.2 11.6   PLT 160 191 134*

## 2013-04-25 NOTE — Progress Notes (Signed)
Case Management Initial Discharge Planning Assessment    Psychosocial/Demographic Information   Name of interviewee: [name], Judeth Cornfield, over the phone   Orientation and decision making abilities of patient (ie a&ox3 able to make decisions, demented pnt, pnt on vent, etc) On vent    Does the patient have an Advance Directive? Location? (home/on chart, if home-advised to bring in copy?) <no information>  Advance Directive: Patient does not have advance directive]    Left information in the room as family requested   Healthcare Decision Maker (HDM) (if other than the patient) Include relationship and contact information.  POC is wife [phone] cell   Any additional emergency contacts? Extended Emergency Contact Information  Primary Emergency Contact: Brightbill,Stephanie  Address: [address]           [place], [place] [address] [place]  Mobile Phone: [phone]  Relation: Spouse   Pt lives with Wife in [place], but pt is staying in [place] for a few weeks due to his job project   Type of residence where patient lives Living Arrangements: Spouse/significant other]   ]   ]1 level house, 2 steps outside as well as a ramp   Prior level of functioning (ambulation & ADL's)  ]   ]independent, able to drive   Support system/Transportation Resources-list  (i.e. Does the patient have difficulty getting to appointments or obtaining medications?) Family members and friends.  Pt's wife is in town and staying in the hotel as she could not find a Arts administrator for her 3 yrs old child   Correct Insurance listed on face sheet - verified with the patient/HDM Yes      BCBS of [place]   Source of Income (SSDI. SSI. Social Security, pension, employment, Catering manager) employment     Economist in Place  Name of Primary Care Physician verified in patient banner (update in patient banner if not listed) Pcp, Noneorunknown, MD  None    Dr. Cliffton Asters in [place]   PCP Follow up apptmt offered/set up Pt's wife will make pt's  f/u appointment     May need discharge clinic appointment if patient stays in [place] longer period of time   What DME does the patient currently own? (rolling walker, hospital bed, home O2, BiPAP/CPAP, bedside commode, cane, hoyer lift)  ]  Assistive Devices: None]   ]   Are PT/OT services indicated? If so, has it been ordered?  PT/OT is ordered, but pt is not able to participate due to vent settings PEEP 14 and 80% o2   Has the patient been to an Acute Rehab or SNF in the past?  If so, where? no   Does the patient currently have home health or hospice/palliative services in place?  If so, list agency name. no   Does the patient already have community dialysis set up?  If so, where? n/a     Readmission Assessment  Current LACE Score >11? Yes/No No (7)   Is this patient an inpatient to inpatient 30 day readmission? no   If readmission, what was the previous D/C plan?  What did or didn't work with the previous d/c plan? n/a     Anticipated Discharge Plan  Discussed Anticipated Discharge Date and Discharge Disposition Possibilities with: ___Patient   _x__Healthcare Decision Maker  ___Other   Anticipated Disposition: Option A    Anticipated Disposition: Option B    If applicable, were SNF or Hospice choices provided?    Palliative Care Consult needed? (  if yes, contact attending MD)     Are there any potential barriers to discharge identified?      ___Lack of Insurance  ___Lack of Health Literacy  ___Undocumented  ___No resources for meds or medical care  ___Transportation issues  ___Language/Cultural/Spiritual  ___Cognitive level / capacity  ___Psychiatric or substance abuse issues  _x__Co-morbidities  ___Potential abuse or neglect  ___Safety issues in the home  ___Potential placement issues  ___Pt / family disagreement with d/c plan  ___Lack of family support  ___Lack of extended family / friend support  ___Home Estate agent (multi-level home/access          issues)   ___ NONE     Inpatient Medicare/Medicare HMO Patients  Only  Was an initial IMM signed within 24 hours of admission?  (Look in Media Tab, Documents Table or Shadow Chart)  n/a     Uninsured Patients Only  If patient has a spouse, does your spouse have insurance under his/her place of employment? n/a   Did the patient sign up for insurance through the Affordable Care Act? n/a

## 2013-04-25 NOTE — Plan of Care (Signed)
Problem: Pain  Goal: Patient's pain/discomfort is manageable  Outcome: Progressing  CPOT -5 per MD order with fentanyl, versed, and propofol.    Problem: Tissue integrity  Goal: Damaged tissue is healing and protected  Outcome: Progressing  WOC done, see note. Abrasions on sacrum, wrists, and ankles.   Goal: Nutritional Status Improving  Outcome: Progressing  Promote TF started via OGT.    Problem: Urinary Incontinence  Goal: Perineal skin integrity is maintained or improved  Assess genitourinary system, perineal skin, labs (urinalysis), and history of incontinence to include past management, aggravating, and alleviating factors. Collaborate with interdisciplinary team and initiate plans and interventions as needed.   Outcome: Progressing  Foley intact. Pericare done.    Problem: Potential for Compromised Hemodynamic Status  Goal: Stable vital signs and fluid balance  Outcome: Progressing  Remains stable with pressor and ventilatory support with a peep of 14.     Problem: Inadequate Gas Exchange  Goal: Adequately oxygenating and ventilation is improved  Outcome: Progressing  Minimal to no sputum from ETT.

## 2013-04-25 NOTE — OT Progress Note (Signed)
Occupational Therapy Cancellation Note    Patient: Adam Simmons  ZOX:[id number]    Unit: IC05/IC05-A    Patient not seen for occupational therapy secondary to high vent settings (PEEP 14, FiO2 80%). P: Will follow up as appropriate    Tennis Ship. Trixie Deis, MS,OTR/L  Pager # [phone]  [phone]

## 2013-04-25 NOTE — Progress Notes (Signed)
Infectious Disease            Progress Note    04/25/2013   Adam Simmons [id number] is a 48 y.o. male, history of hypertension, diabetes mellitus, kidney stones. Admitted with septic shock, pneumonia, respiratory failure.    Subjective:     Adam Simmons today Symptoms:  On ventilator, on pressors, blood cultures growing gram-positive cocci in chains. Afebrile.   Other review of system is non contributory.    Objective:     Blood pressure 125/85, pulse 102, temperature 97.1 F (36.2 C), temperature source Temporal Artery, resp. rate 28, height 1.727 m (5\' 8" ), weight 126.7 kg (279 lb 5.2 oz), SpO2 99.00%.    General Appearance: Intubated and sedated.    HEENT: Pallor negative, Anicteric sclera.  Intubated  Lungs:  Bilateral decreased breath sounds; bilateral crackles  Heart: Normal rate.  S1 and S2.    Chest Wall: Symmetric chest wall expansion.   Abdomen: Abdomen is soft, scaphoid and non-distended. There are no signs of ascites. Bowel sounds are normal.   Neurological: Patient is intubated and sedated    Laboratory And Diagnostic Studies:     Recent Labs   [facility] 04/25/13 0726 04/25/13 0219 04/24/13 1103    WBC 19.86* 26.09* --    HGB 12.3* 12.0* --    HCT 39.0* 38.6* --    PLT 160 191 --    NEUTRO -- -- 46     Recent Labs   Basename 04/25/13 0730 04/25/13 0219    NA 143 144    K 4.0 4.2    CL 111* 112*    CO2 20* 17*    BUN 37.0* 33.7*    CREAT 1.6* 1.4*    GLU 321* 295*    CA 7.0* 7.1*     Recent Labs   Basename 04/25/13 0730 04/24/13 1103    AST 191* 42*    ALT 126* 44    ALKPHOS 49 58    PROT 5.2* 7.0    ALB 2.3* 3.4*    BILITOTAL 2.0* 1.4*     Blood cultures: Gram-positive cocci in chains      Current Med's:     Current Facility-Administered Medications   Medication Dose Route Frequency   . [COMPLETED] albuterol  2.5 mg Nebulization Once   . [COMPLETED] albuterol  2.5 mg Nebulization Once   . albuterol-ipratropium  3 mL Nebulization Q4H SCH   . [COMPLETED] azithromycin  500 mg  Intravenous Once   . [COMPLETED] cefTRIAXone  1 g Intravenous Once   . chlorhexidine  15 mL Mouth/Throat BID   . docusate  100 mg Oral BID   . enoxaparin  40 mg Subcutaneous Daily   . famotidine  20 mg per OG tube BID   . insulin aspart  1-8 Units Subcutaneous Q4H   . [COMPLETED] ipratropium  0.5 mg Nebulization Once   . lactobacillus/streoptococcus  1 capsule Oral Daily   . levofloxacin  750 mg Intravenous Daily   . [COMPLETED] LORazepam       . [COMPLETED] methylprednisolone  125 mg Intravenous Once   . methylprednisolone  40 mg Intravenous Q6H   . piperacillin-tazobactam  4.5 g Intravenous Q8H SCH   . polyethylene glycol  17 g Oral BID   . [COMPLETED] propofol       . [COMPLETED] propofol       . [COMPLETED] sodium chloride  1,000 mL Intravenous Once   . [COMPLETED] sodium chloride  1,000 mL Intravenous Once   . [  COMPLETED] sodium chloride  1,000 mL Intravenous Once   . thiamine  100 mg Oral Daily   . vancomycin  1,000 mg Intravenous Q8H SCH   . [COMPLETED] vancomycin  2,500 mg Intravenous Once   . [COMPLETED] vecuronium       . [COMPLETED] vecuronium  10 mg Intravenous Once   . [DISCONTINUED] oseltamivir  75 mg Oral BID   . [DISCONTINUED] oseltamivir  75 mg Oral BID   . [DISCONTINUED] piperacillin-tazobactam  4.5 g Intravenous [facility]       Line, Drains, Airways:     NG/OG Tube Orogastric 16 Fr. Center mouth (Active)   Number of days:1       Urethral Catheter Non-latex 16 Fr. (Active)   Number of days:1         Arterial Line 04/24/13 Left Radial (Active)   Number of days:1       CVC Triple Lumen 04/24/13 Left Internal jugular (Active)   Number of days:1     Assessment:      Condition.  Guarded   Septic shock   Gram-positive sepsis     Respiratory failure, status post intubation   Bilateral pneumonia   Possible aspiration   Diabetes mellitus   Acute kidney injury    Plan:      Continue Levaquin   Continue vancomycin   Continue Zosyn   Continue pressor support   Discontinue Tamiflu   Continue  ventilatory support   Will follow Cultures   Continue supportive care   Correction of electrolytes   Nephrology followup          Alfonzo Beers, M.D.,FACP  04/25/2013  8:30 AM

## 2013-04-25 NOTE — Consults (Addendum)
CONSULTATION    Date Time: 04/25/2013 1:43 PM  Patient Name: Adam Simmons  Requesting Physician: Lawernce Ion, MD      Reason for Consultation:   abrasion      Past Medical History:     Past Medical History   Diagnosis Date   . Hypertension    . Diabetes mellitus    . Kidney stones        Past Surgical History:     Past Surgical History   Procedure Date   . Kidney stone surgery        Assessment:   Admitted with respiratory failure  HX DM, septic shock  WBC 19.86  H&H 12.3/39.0    Patient intubated    Abrasion noted to sacral area  Measures 1.0cm x 0.3cm   Superficial, epidermal abrasion  No drainage noted    Right wrist abrasion  Measures 1.5cm x 2.0cm  With dry intact scab noted  Wound margins normal skin color  No drainage    Bilateral lower extremities  With multiple linear abrasion with scab noted  Edema noted to bilateral lower extremities  ? Scratching or from socks  Skin color to abrasion normal  No drainage noted    braden 9  Intervention(s):   mepilex sacral dressing applied to sacral abrasions  Right wrist, bilateral lower extremities with scabs  Open to air  Plan:   Will continue to follow        Allergies:   No Known Allergies      Vladimir Creeks, RN  [facility]  Pager # [phone]

## 2013-04-26 ENCOUNTER — Inpatient Hospital Stay: Payer: BC Managed Care – PPO

## 2013-04-26 LAB — LACTIC ACID, PLASMA: Lactic Acid: 1.7 mmol/L (ref 0.7–2.1)

## 2013-04-26 LAB — ARTERIAL BLOOD GAS (~~LOC~~)
Base Excess, Arterial: 3.1 mEq/L — ABNORMAL HIGH (ref <=2.0)
FIO2: 70 %
HCO3, Arterial: 29.8 mEq/L — ABNORMAL HIGH (ref 22.0–28.0)
Inspiratory Time (Secs): 0.7
O2 Sat, Arterial: 96 % (ref 95.0–100.0)
PEEP: 14
Peak Inspiratory Pressure: 31
Rate: 28
Respiratory Rate: 28
Temperature: 98.6
Tidal vol.: 380
pCO2, Arterial: 54 — ABNORMAL HIGH (ref 35.0–45.0)
pH, Arterial: 7.35 (ref 7.35–7.45)
pO2, Arterial: 89 (ref 80.0–100.0)

## 2013-04-26 LAB — BASIC METABOLIC PANEL
Anion Gap: 15 (ref 5.0–15.0)
BUN: 39.3 mg/dL — ABNORMAL HIGH (ref 9.0–21.0)
CO2: 25 mEq/L (ref 22–29)
Calcium: 7.2 mg/dL — ABNORMAL LOW (ref 8.5–10.5)
Chloride: 112 mEq/L — ABNORMAL HIGH (ref 98–107)
Creatinine: 2.2 mg/dL — ABNORMAL HIGH (ref 0.7–1.3)
Glucose: 346 mg/dL — ABNORMAL HIGH (ref 70–100)
Potassium: 4 mEq/L (ref 3.5–5.1)
Sodium: 152 mEq/L — ABNORMAL HIGH (ref 136–145)

## 2013-04-26 LAB — CBC
Hematocrit: 36.7 % — ABNORMAL LOW (ref 42.0–52.0)
Hgb: 11.5 g/dL — ABNORMAL LOW (ref 13.0–17.0)
MCH: 28.3 pg (ref 28.0–32.0)
MCHC: 31.3 g/dL — ABNORMAL LOW (ref 32.0–36.0)
MCV: 90.4 fL (ref 80.0–100.0)
MPV: 11.4 fL (ref 9.4–12.3)
Nucleated RBC: 0 (ref 0–1)
Platelets: 121 10*3/uL — ABNORMAL LOW (ref 140–400)
RBC: 4.06 10*6/uL — ABNORMAL LOW (ref 4.70–6.00)
RDW: 16 % — ABNORMAL HIGH (ref 12–15)
WBC: 14.13 10*3/uL — ABNORMAL HIGH (ref 3.50–10.80)

## 2013-04-26 LAB — MAGNESIUM: Magnesium: 2 mg/dL (ref 1.6–2.6)

## 2013-04-26 LAB — MYCOPLASMA PNEUMONIAE AB (IGG,IGM),EIA
M. pneumoniae Ab IgG, EIA: 2.38 — ABNORMAL HIGH (ref <=0.90)
M. pneumoniae Ab IgM, EIA: 118 (ref <770)

## 2013-04-26 LAB — GLUCOSE WHOLE BLOOD - POCT
Whole Blood Glucose POCT: 293 mg/dL — ABNORMAL HIGH (ref 70–100)
Whole Blood Glucose POCT: 316 mg/dL — ABNORMAL HIGH (ref 70–100)
Whole Blood Glucose POCT: 296 mg/dL — ABNORMAL HIGH (ref 70–100)
Whole Blood Glucose POCT: 330 mg/dL — ABNORMAL HIGH (ref 70–100)
Whole Blood Glucose POCT: 332 mg/dL — ABNORMAL HIGH (ref 70–100)
Whole Blood Glucose POCT: 141 mg/dL — ABNORMAL HIGH (ref 70–100)
Whole Blood Glucose POCT: 337 mg/dL — ABNORMAL HIGH (ref 70–100)

## 2013-04-26 LAB — GFR: EGFR: 32

## 2013-04-26 LAB — PHOSPHORUS: Phosphorus: 3 mg/dL (ref 2.3–4.7)

## 2013-04-26 MED ORDER — INSULIN ASPART 100 UNIT/ML SC SOLN
15.0000 [IU] | Freq: Once | SUBCUTANEOUS | Status: AC
Start: 2013-04-26 — End: 2013-04-26
  Administered 2013-04-26: 15 [IU] via SUBCUTANEOUS
  Filled 2013-04-26: qty 150

## 2013-04-26 MED ORDER — SODIUM CHLORIDE 0.9 % IV MBP
1000.0000 mg | INTRAVENOUS | Status: DC
Start: 2013-04-26 — End: 2013-04-27
  Administered 2013-04-26 – 2013-04-27 (×2): 1000 mg via INTRAVENOUS
  Filled 2013-04-26 (×2): qty 1000

## 2013-04-26 MED ORDER — DEXTROSE 5 % IV SOLN
INTRAVENOUS | Status: DC
Start: 2013-04-26 — End: 2013-04-26
  Administered 2013-04-26: 150 mL/h via INTRAVENOUS

## 2013-04-26 MED ORDER — INSULIN ASPART 100 UNIT/ML SC SOLN
1.0000 [IU] | SUBCUTANEOUS | Status: DC
Start: 2013-04-26 — End: 2013-05-09
  Administered 2013-04-26 (×2): 10 [IU] via SUBCUTANEOUS
  Administered 2013-04-27: 2 [IU] via SUBCUTANEOUS
  Administered 2013-04-27: 4 [IU] via SUBCUTANEOUS
  Administered 2013-04-27: 7 [IU] via SUBCUTANEOUS
  Administered 2013-04-27 – 2013-04-28 (×5): 4 [IU] via SUBCUTANEOUS
  Administered 2013-04-28 (×2): 7 [IU] via SUBCUTANEOUS
  Administered 2013-04-28: 4 [IU] via SUBCUTANEOUS
  Administered 2013-04-28: 7 [IU] via SUBCUTANEOUS
  Administered 2013-04-29: 2 [IU] via SUBCUTANEOUS
  Administered 2013-04-29 (×3): 4 [IU] via SUBCUTANEOUS
  Administered 2013-04-29: 7 [IU] via SUBCUTANEOUS
  Administered 2013-04-29 – 2013-04-30 (×6): 4 [IU] via SUBCUTANEOUS
  Administered 2013-05-01 (×2): 2 [IU] via SUBCUTANEOUS
  Administered 2013-05-01 (×2): 4 [IU] via SUBCUTANEOUS
  Administered 2013-05-01: 2 [IU] via SUBCUTANEOUS
  Administered 2013-05-01 – 2013-05-02 (×2): 4 [IU] via SUBCUTANEOUS
  Administered 2013-05-02 (×4): 2 [IU] via SUBCUTANEOUS
  Administered 2013-05-02 – 2013-05-03 (×2): 4 [IU] via SUBCUTANEOUS
  Administered 2013-05-03 (×2): 2 [IU] via SUBCUTANEOUS
  Administered 2013-05-03 (×2): 4 [IU] via SUBCUTANEOUS
  Administered 2013-05-03: 2 [IU] via SUBCUTANEOUS
  Administered 2013-05-03: 4 [IU] via SUBCUTANEOUS
  Administered 2013-05-04 (×3): 2 [IU] via SUBCUTANEOUS
  Administered 2013-05-04 (×2): 4 [IU] via SUBCUTANEOUS
  Administered 2013-05-05: 7 [IU] via SUBCUTANEOUS
  Administered 2013-05-05 (×2): 4 [IU] via SUBCUTANEOUS
  Administered 2013-05-05: 2 [IU] via SUBCUTANEOUS
  Administered 2013-05-05: 4 [IU] via SUBCUTANEOUS
  Administered 2013-05-06 (×2): 2 [IU] via SUBCUTANEOUS
  Administered 2013-05-06 (×5): 4 [IU] via SUBCUTANEOUS
  Administered 2013-05-07: 7 [IU] via SUBCUTANEOUS
  Administered 2013-05-07: 2 [IU] via SUBCUTANEOUS
  Administered 2013-05-07: 7 [IU] via SUBCUTANEOUS
  Administered 2013-05-07: 4 [IU] via SUBCUTANEOUS
  Administered 2013-05-07 – 2013-05-08 (×3): 7 [IU] via SUBCUTANEOUS
  Administered 2013-05-08: 4 [IU] via SUBCUTANEOUS
  Administered 2013-05-08: 7 [IU] via SUBCUTANEOUS
  Administered 2013-05-08: 10 [IU] via SUBCUTANEOUS
  Administered 2013-05-08 – 2013-05-09 (×5): 7 [IU] via SUBCUTANEOUS
  Administered 2013-05-09: 4 [IU] via SUBCUTANEOUS
  Filled 2013-04-26: qty 40
  Filled 2013-04-26: qty 70
  Filled 2013-04-26: qty 40
  Filled 2013-04-26: qty 20
  Filled 2013-04-26 (×3): qty 40
  Filled 2013-04-26 (×2): qty 20
  Filled 2013-04-26: qty 40
  Filled 2013-04-26: qty 70
  Filled 2013-04-26: qty 20
  Filled 2013-04-26 (×2): qty 40
  Filled 2013-04-26: qty 70
  Filled 2013-04-26: qty 20
  Filled 2013-04-26: qty 10
  Filled 2013-04-26 (×3): qty 40
  Filled 2013-04-26: qty 70
  Filled 2013-04-26: qty 40
  Filled 2013-04-26 (×2): qty 70
  Filled 2013-04-26 (×3): qty 40
  Filled 2013-04-26 (×2): qty 20
  Filled 2013-04-26: qty 40
  Filled 2013-04-26 (×2): qty 20
  Filled 2013-04-26 (×3): qty 40
  Filled 2013-04-26: qty 70
  Filled 2013-04-26 (×2): qty 20
  Filled 2013-04-26: qty 70
  Filled 2013-04-26: qty 40
  Filled 2013-04-26: qty 20
  Filled 2013-04-26: qty 70
  Filled 2013-04-26: qty 20
  Filled 2013-04-26: qty 70
  Filled 2013-04-26: qty 40
  Filled 2013-04-26 (×2): qty 20
  Filled 2013-04-26: qty 40
  Filled 2013-04-26: qty 70
  Filled 2013-04-26: qty 40
  Filled 2013-04-26: qty 70
  Filled 2013-04-26 (×2): qty 40
  Filled 2013-04-26: qty 70
  Filled 2013-04-26: qty 40
  Filled 2013-04-26: qty 70
  Filled 2013-04-26 (×3): qty 100
  Filled 2013-04-26 (×2): qty 40
  Filled 2013-04-26: qty 20
  Filled 2013-04-26: qty 40
  Filled 2013-04-26: qty 70
  Filled 2013-04-26 (×2): qty 20
  Filled 2013-04-26 (×4): qty 40
  Filled 2013-04-26: qty 70
  Filled 2013-04-26: qty 20
  Filled 2013-04-26 (×3): qty 40
  Filled 2013-04-26: qty 70
  Filled 2013-04-26 (×2): qty 20
  Filled 2013-04-26 (×2): qty 40

## 2013-04-26 NOTE — Progress Notes (Signed)
Infectious Disease            Progress Note    04/26/2013   Adam Simmons [id number] is a 48 y.o. male, history of hypertension, diabetes mellitus, kidney stones. Admitted with septic shock, pneumonia, respiratory failure.    Subjective:     Adam Simmons today Symptoms:  Improving , decreasing leukocytosis, resolved. Lactic acidosis. On ventilator, on pressors, blood cultures growing strep pneumoniae. Afebrile. Other review of system is non contributory.    Objective:     Blood pressure 97/63, pulse 92, temperature 98.6 F (37 C), temperature source Temporal Artery, resp. rate 28, height 1.727 m (5\' 8" ), weight 134.7 kg (296 lb 15.4 oz), SpO2 98.00%.    General Appearance: Intubated and sedated.    HEENT: Pallor negative, Anicteric sclera.  Intubated  Lungs:  Bilateral decreased breath sounds; bilateral crackles  Heart: Normal rate.  S1 and S2.    Chest Wall: Symmetric chest wall expansion.   Abdomen: Abdomen is soft, scaphoid and non-distended. There are no signs of ascites. Bowel sounds are normal.   Neurological: Patient is intubated and sedated    Laboratory And Diagnostic Studies:     Recent Labs   [facility] 04/26/13 0304 04/25/13 0726 04/24/13 1103    WBC 14.13* 19.86* --    HGB 11.5* 12.3* --    HCT 36.7* 39.0* --    PLT 121* 160 --    NEUTRO -- 56 46     Recent Labs   Basename 04/26/13 0304 04/25/13 1827    NA 152* 148*    K 4.0 3.6    CL 112* 112*    CO2 25 24    BUN 39.3* 36.5*    CREAT 2.2* 2.0*    GLU 346* 308*    CA 7.2* 7.2*     Recent Labs   Basename 04/25/13 1827 04/25/13 0730    AST 114* 191*    ALT 125* 126*    ALKPHOS 48 49    PROT 5.1* 5.2*    ALB 2.1* 2.3*    BILITOTAL 1.4* 2.0*     Blood cultures:  Strep pneumoniae      Current Med's:     Current Facility-Administered Medications   Medication Dose Route Frequency   . albuterol-ipratropium  3 mL Nebulization Q4H SCH   . chlorhexidine  15 mL Mouth/Throat BID   . docusate  100 mg Oral BID   . enoxaparin  40 mg Subcutaneous  Daily   . ertapenem  1,000 mg Intravenous Q24H SCH   . famotidine  20 mg per OG tube BID   . insulin aspart  1-8 Units Subcutaneous Q4H   . insulin glargine  15 Units Subcutaneous QHS   . lactobacillus/streoptococcus  1 capsule Oral Daily   . levofloxacin  750 mg Intravenous Q48H   . methylprednisolone  20 mg Intravenous Q12H SCH   . polyethylene glycol  17 g Oral BID   . thiamine  100 mg Oral Daily   . [DISCONTINUED] levofloxacin  750 mg Intravenous Daily   . [DISCONTINUED] methylprednisolone  20 mg Intravenous Q8H   . [DISCONTINUED] methylprednisolone  40 mg Intravenous Q6H   . [DISCONTINUED] oseltamivir  75 mg Oral BID   . [DISCONTINUED] piperacillin-tazobactam  4.5 g Intravenous Q8H SCH   . [DISCONTINUED] vancomycin  1,000 mg Intravenous [facility]       Line, Drains, Airways:     NG/OG Tube Orogastric 16 Fr. Center mouth (Active)   Number of days:2  Urethral Catheter Non-latex 16 Fr. (Active)   Number of days:2         Arterial Line 04/24/13 Left Radial (Active)   Number of days:2       CVC Triple Lumen 04/24/13 Left Internal jugular (Active)   Number of days:2     Assessment:      Condition.  Guarded   Septic shock   Strep pneumoniae sepsis     Respiratory failure, status post intubation   Bilateral pneumonia   Possible aspiration   Diabetes mellitus   Acute kidney injury   Thrombocytopenia    Elevated liver function tests    Plan:      Start Invanz   Continue Levaquin   Continue vancomycin;  according to levels   Discontinue  Zosyn   Continue pressor support   Continue ventilatory support   Will follow final Cultures   Continue supportive care   Correction of electrolytes   Nephrology follow up   Discussed with Dr. Roxan Hockey, M.D.,FACP  04/26/2013  8:22 AM

## 2013-04-26 NOTE — Progress Notes (Signed)
[facility]- Critical Care Note     ICU Daily Progress Note        Date Time: 04/26/2013 11:28 AM  Patient Name: Adam Simmons  Attending Physician: Lawernce Ion, MD  Room: IC05/IC05-A   Admit Date: 04/24/2013  LOS: 2 days        Assessment/Plan:   # Neurologic: Patient needs to be sedated in order to allow mechanical   ventilatory support.    # Respiratory: Respiratory failure from what appears to be multilobar   pneumonia with hypoxia on ventilator, concerning for ARDS at this time.   Continue lung protective strategy.  Improved hypoxemia.  Decrease ventilatory settings as patient tolerates.    # Nutrition: appreciate nutritionist recommendation,     # Endocrine: Diabetes; uncontrolled, increase to high dose sliding-scale insulin and cont lantus.     # Cardiac: septic shock: cont Neo-Synephrine. The patient has been aggressively fluid resuscitated. Echo indicated EF 50%    # Hematologic: Thrombocytopenia most likely from underlying sepsis     #renal: acute kidney injury, consult Dr. Cleophus Molt to follow    # Infectious diseases: streptococcus pneumonia bacteremia/ pneumonia.  Appreciate Dr. Myrtis Ser consultation and evaluation. Continue antibiotics    Code Status: full code     I have personally reviewed the patient's history and 24 hour interval events, along with vitals, labs, radiology images, and nurses report.         Subjective:     48 y.o. male h/o DM type 2, HTN who presents to the hospital with respiratory failure, from streptococcus pneumonia, ARDS, AKI and septic shock.        Patient able to come down on oxygenation and pressor.     Medications:   Scheduled Meds:  Current Facility-Administered Medications   Medication Dose Route Frequency   . albuterol-ipratropium  3 mL Nebulization Q4H SCH   . chlorhexidine  15 mL Mouth/Throat BID   . docusate  100 mg Oral BID   . enoxaparin  40 mg Subcutaneous Daily   . ertapenem  1,000 mg Intravenous Q24H SCH   . famotidine  20 mg per OG tube BID   . insulin  aspart  1-12 Units Subcutaneous Q4H   . insulin aspart  15 Units Subcutaneous Once   . insulin glargine  15 Units Subcutaneous QHS   . lactobacillus/streoptococcus  1 capsule Oral Daily   . levofloxacin  750 mg Intravenous Q48H   . methylprednisolone  20 mg Intravenous Q12H SCH   . polyethylene glycol  17 g Oral BID   . thiamine  100 mg Oral Daily   . [DISCONTINUED] insulin aspart  1-8 Units Subcutaneous Q4H   . [DISCONTINUED] levofloxacin  750 mg Intravenous Daily   . [DISCONTINUED] methylprednisolone  20 mg Intravenous Q8H   . [DISCONTINUED] methylprednisolone  40 mg Intravenous Q6H   . [DISCONTINUED] piperacillin-tazobactam  4.5 g Intravenous Q8H SCH   . [DISCONTINUED] vancomycin  1,000 mg Intravenous Q8H SCH         Continuous Infusions:       . IV fluids with sodium bicarbonate 100 mL/hr (04/26/13 0728)   . fentaNYL 200 mcg/hr (04/26/13 0922)   . midazolam (VERSED) infusion 20 mg/hr (04/26/13 1610)   . phenylephrine 50 mcg/min (04/26/13 0730)   . propofol 20 mcg/kg/min (04/26/13 0412)   . [DISCONTINUED] IV fluids with sodium bicarbonate 150 mL/hr (04/25/13 1800)          Physical Exam:     Filed Vitals:  04/26/13 1100   BP: 90/57   Pulse: 88   Temp:    Resp: 28   SpO2: 98%         Intake/Output Summary (Last 24 hours) at 04/26/13 1128  Last data filed at 04/26/13 0800   Gross per 24 hour   Intake 5060.2 ml   Output   4700 ml   Net  360.2 ml     General Appearance: obese, sedated, intubated   Mental status: Sedated but opens eyes easily   Neuro: Sedated, intubated   Neck: supple   Lungs: bilateral rhonchi anteriorly, expiratory wheezing   Cardiac: ns1, ns2, no m/r/g, tachycardic   Abdomen: Obese, soft, nontender, hypoactive bowel sounds   Extremities: trace lower extremity edema   Skin: Has band like abrasion around bilateral ankles, has a triangular mark on right forearm area that is scabbed over, has abrasion on left forearm, has grease on his hands, has abrasions on his sacral area, has various bruising on  his upper extremities.         Data:       Invasive ICU Hemodynamics:    Invasive Hemodynamic Monitoring  CVP (mmHg): 22 mmHg    Art Line  Arterial Line BP: 98/57 mmHg  Arterial Line MAP (mmHg): 70 mmHg      Vent Settings:    Vent Settings  Vent Mode: PRVC  FiO2: 70 %  Resp Rate (Set): 28   Vt (Set, mL): 380 mL  PIP Observed (cm H2O): 30 cm H2O  PEEP/EPAP: 14 cm H20  Pressure Support / IPAP: 10 cmH20  Mean Airway Pressure: 19 cmH20        Labs:         Labs (last 72 hours):  Recent Labs   Basename 04/26/13 0304 04/25/13 0726 04/25/13 0219    WBC 14.13* 19.86* 26.09*    HGB 11.5* 12.3* 12.0*    HCT 36.7* 39.0* 38.6*    LABPLAT -- -- --     No results found for this basename: PT:3,INR:3,PTT:3 in the last 72 hours Recent Labs   Basename 04/26/13 0304 04/25/13 1827 04/25/13 1401    NA 152* 148* 147*    K 4.0 3.6 3.8    CL 112* 112* 111*    CO2 25 24 23     BUN 39.3* 36.5* 36.3*    CREAT 2.2* 2.0* 1.8*    GLU 346* 308* 317*    CA 7.2* 7.2* 7.2*    MG 2.0 1.9 1.8    PHOS 3.0 3.1 --                     Rads:   Radiological Imaging personally reviewed,and agree with radiology report including:       CXR  04/26/2013    mildy improved multilobar pneumonia      I have personally reviewed the patient's history and 24 hour interval events, along with vitals, labs, radiology images and nursing.         Signed by: Lawernce Ion, MD  Date/Time: 04/26/2013 11:28 AM

## 2013-04-26 NOTE — PT Eval Note (Signed)
Physical Therapy Cancellation Note    Patient: Adam Simmons  ZOX:[id number]    Unit: IC05/IC05-A    Patient not seen for physical therapy secondary to vent settings remain high (FiO2 50%, PEEP 12). Will follow up as appropriate.      Delfin Edis, PT, DPT  Pager #: [phone]

## 2013-04-26 NOTE — Progress Notes (Signed)
Daily Progress Note    Date Time: 04/26/2013 7:06 PM  Patient Name: Adam Simmons  Attending Physician: Lawernce Ion, MD  Room: IC05/IC05-A   Admit Date: 04/24/2013  LOS: 2 days        Assessment/Plan:   #Neuro: sedated. RASS -5. Propofol [place].  Versed and fentanyl drips.     #Cardio: SR. Continued Neo synephrine started to maintain MAP >65.  Febrile-tylenol given with good results.    #Resp: Vent settings changed. ABG sent.     #GI: rounded, distention- bowel regimen given. No BM    #Infectious Disease (ID): changed antibiotics per Dr. Janalyn Rouse.           #Renal /Fluid, Electrolytes : foley. U/o adequate    #Endo: accuchecks-coverage given.    #Skin: abrasions and bruising noted-WOC consulted.    #Nutrition: TFs. Started free water flushes.    #Lines: 2PIV, CVC    #Prophylaxis:   GI Prophylaxis:  pepcid  VTE Prophylaxis: lovenox and SCDs.       Medications:   Scheduled Meds:  Current Facility-Administered Medications   Medication Dose Route Frequency   . albuterol-ipratropium  3 mL Nebulization Q4H SCH   . chlorhexidine  15 mL Mouth/Throat BID   . docusate  100 mg Oral BID   . enoxaparin  40 mg Subcutaneous Daily   . ertapenem  1,000 mg Intravenous Q24H SCH   . famotidine  20 mg per OG tube BID   . insulin aspart  1-12 Units Subcutaneous Q4H   . [COMPLETED] insulin aspart  15 Units Subcutaneous Once   . insulin glargine  15 Units Subcutaneous QHS   . lactobacillus/streoptococcus  1 capsule Oral Daily   . levofloxacin  750 mg Intravenous Q48H   . methylprednisolone  20 mg Intravenous Q12H SCH   . polyethylene glycol  17 g Oral BID   . thiamine  100 mg Oral Daily   . [DISCONTINUED] insulin aspart  1-8 Units Subcutaneous Q4H   . [DISCONTINUED] piperacillin-tazobactam  4.5 g Intravenous [facility]         Continuous Infusions:       . dextrose 150 mL/hr (04/26/13 1301)   . fentaNYL 150 mcg/hr (04/26/13 1836)   . midazolam (VERSED) infusion 16 mg/hr (04/26/13 1836)   . phenylephrine 50 mcg/min (04/26/13 1833)   . propofol  Stopped (04/26/13 1258)   . [DISCONTINUED] IV fluids with sodium bicarbonate 100 mL/hr (04/26/13 0728)   . [DISCONTINUED] IV fluids with sodium bicarbonate Stopped (04/26/13 1301)        Data:   Invasive ICU Hemodynamics:    Invasive Hemodynamic Monitoring  CVP (mmHg): 22 mmHg     Vent Settings:    Vent Settings  Vent Mode: PRVC  FiO2: 50 %  Resp Rate (Set): 28   Vt (Set, mL): 380 mL  PIP Observed (cm H2O): 32 cm H2O  PEEP/EPAP: 12 cm H20  Pressure Support / IPAP: 10 cmH20  Mean Airway Pressure: 19 cmH20        Labs:   Labs (last 72 hours):  Recent Labs   Basename 04/26/13 0304 04/25/13 0726 04/25/13 0219    WBC 14.13* 19.86* 26.09*    HGB 11.5* 12.3* 12.0*    HCT 36.7* 39.0* 38.6*    LABPLAT -- -- --     No results found for this basename: PT:3,INR:3,PTT:3 in the last 72 hours Recent Labs   Basename 04/26/13 0304 04/25/13 1827 04/25/13 1401    NA 152* 148* 147*  K 4.0 3.6 3.8    CL 112* 112* 111*    CO2 25 24 23     BUN 39.3* 36.5* 36.3*    CREAT 2.2* 2.0* 1.8*    GLU 346* 308* 317*    CA 7.2* 7.2* 7.2*    MG 2.0 1.9 1.8    PHOS 3.0 3.1 --

## 2013-04-26 NOTE — Progress Notes (Signed)
[facility] [employer]  Progress Note    Assessment:  1.AKI- non-oliguric  2. Pneumonia with ARDS- on ventilator  3. Septic shock on pressors  4. Severe acidosis - combined respiratory and metabolic  5. DM on Metformin  6. HTN  7. Kidney stones  8. Exposure to NSAIDs prior to admission  9. Hypernatremia    Plan:  1.Ventilator support  2. Hemodynamic support  3. IV fluid - change to D5W   4. Daily labs  5. Adjust meds based on GFR < 30  6. PEG feeding with water flushes    Herbie Drape, MD  Office - [phone]  ++++++++++++++++++++++++++++++++++++++++++++++++++++++++++++++  Subjective:  Intubated on vent    Medications:  Scheduled Meds:  Current Facility-Administered Medications   Medication Dose Route Frequency   . albuterol-ipratropium  3 mL Nebulization Q4H SCH   . chlorhexidine  15 mL Mouth/Throat BID   . docusate  100 mg Oral BID   . enoxaparin  40 mg Subcutaneous Daily   . ertapenem  1,000 mg Intravenous Q24H SCH   . famotidine  20 mg per OG tube BID   . insulin aspart  1-12 Units Subcutaneous Q4H   . [COMPLETED] insulin aspart  15 Units Subcutaneous Once   . insulin glargine  15 Units Subcutaneous QHS   . lactobacillus/streoptococcus  1 capsule Oral Daily   . levofloxacin  750 mg Intravenous Q48H   . methylprednisolone  20 mg Intravenous Q12H SCH   . polyethylene glycol  17 g Oral BID   . thiamine  100 mg Oral Daily   . [DISCONTINUED] insulin aspart  1-8 Units Subcutaneous Q4H   . [DISCONTINUED] levofloxacin  750 mg Intravenous Daily   . [DISCONTINUED] methylprednisolone  20 mg Intravenous Q8H   . [DISCONTINUED] methylprednisolone  40 mg Intravenous Q6H   . [DISCONTINUED] piperacillin-tazobactam  4.5 g Intravenous Q8H SCH   . [DISCONTINUED] vancomycin  1,000 mg Intravenous [facility]     Continuous Infusions:       . dextrose 150 mL/hr (04/26/13 1301)   . fentaNYL 200 mcg/hr (04/26/13 0922)   . midazolam (VERSED) infusion 20 mg/hr (04/26/13 1156)   . phenylephrine 50 mcg/min (04/26/13  0730)   . propofol Stopped (04/26/13 1258)   . [DISCONTINUED] IV fluids with sodium bicarbonate 100 mL/hr (04/26/13 0728)   . [DISCONTINUED] IV fluids with sodium bicarbonate Stopped (04/26/13 1301)     PRN Meds:acetaminophen, albuterol, dextrose, dextrose, fentaNYL, glucagon (rDNA), LORazepam, vancomycin, vecuronium    Objective:  Vital signs in last 24 hours:  Temp:  [98.6 F (37 C)-100.3 F (37.9 C)] 98.6 F (37 C)  Heart Rate:  [88-104] 95   Resp Rate:  [28] 28   BP: (90-119)/(55-79) 90/57 mmHg  Arterial Line BP: (94-133)/(57-78) 108/60 mmHg  FiO2:  [50 %-70 %] 50 %    Intake/Output from yesterday (07:01 - 07:00):  02/04 0701 - 02/05 0700  In: 5994.2 [I.V.:5265.2]  Out: 5400 [Urine:5400]     Physical Exam:   Gen: WD WN NAD   CV: S1 S2 N RRR   Chest: CTAB   Ab: ND NT soft no HSM +BS   Ext: No C/E    Labs:    Lab 04/26/13 0304 04/25/13 1827 04/25/13 1401 04/25/13 0730 04/24/13 1103   GLU 346* 308* 317* -- --   BUN 39.3* 36.5* 36.3* -- --   CREAT 2.2* 2.0* 1.8* -- --   CA 7.2* 7.2* 7.2* -- --   NA  152* 148* 147* -- --   K 4.0 3.6 3.8 -- --   CL 112* 112* 111* -- --   CO2 25 24 23  -- --   ALB -- 2.1* -- 2.3* 3.4*   PHOS 3.0 3.1 -- -- --   MG 2.0 1.9 1.8 -- --       Lab 04/26/13 0304 04/25/13 0726 04/25/13 0219   WBC 14.13* 19.86* 26.09*   HGB 11.5* 12.3* 12.0*   HCT 36.7* 39.0* 38.6*   MCV 90.4 90.1 89.8   MCH 28.3 28.4 27.9*   MCHC 31.3* 31.5* 31.1*   RDW 16* 16* 16*   MPV 11.4 11.9 12.2   PLT 121* 160 191

## 2013-04-26 NOTE — OT Progress Note (Signed)
Occupational Therapy Cancellation Note    Patient: Adam Simmons  ZOX:[id number]    Unit: IC05/IC05-A    Patient not seen for occupational therapy secondary to vent settings remain high (FiO2 50%, PEEP 12). Will follow up as appropriate.    Tennis Ship. Trixie Deis, MS,OTR/L  Pager # [phone]  [phone]

## 2013-04-27 ENCOUNTER — Inpatient Hospital Stay: Payer: BC Managed Care – PPO

## 2013-04-27 ENCOUNTER — Encounter
Admission: EM | Disposition: A | Discharge status: Skilled Nursing Facility | Payer: Self-pay | Source: Home / Self Care | Attending: Internal Medicine

## 2013-04-27 LAB — CBC
Hematocrit: 36 % — ABNORMAL LOW (ref 42.0–52.0)
Hgb: 11.1 g/dL — ABNORMAL LOW (ref 13.0–17.0)
MCH: 28.3 pg (ref 28.0–32.0)
MCHC: 30.8 g/dL — ABNORMAL LOW (ref 32.0–36.0)
MCV: 91.8 fL (ref 80.0–100.0)
MPV: 11.5 fL (ref 9.4–12.3)
Nucleated RBC: 1 (ref 0–1)
Platelets: 134 10*3/uL — ABNORMAL LOW (ref 140–400)
RBC: 3.92 10*6/uL — ABNORMAL LOW (ref 4.70–6.00)
RDW: 16 % — ABNORMAL HIGH (ref 12–15)
WBC: 19.16 10*3/uL — ABNORMAL HIGH (ref 3.50–10.80)

## 2013-04-27 LAB — GLUCOSE WHOLE BLOOD - POCT
Whole Blood Glucose POCT: 182 mg/dL — ABNORMAL HIGH (ref 70–100)
Whole Blood Glucose POCT: 189 mg/dL — ABNORMAL HIGH (ref 70–100)
Whole Blood Glucose POCT: 211 mg/dL — ABNORMAL HIGH (ref 70–100)
Whole Blood Glucose POCT: 213 mg/dL — ABNORMAL HIGH (ref 70–100)
Whole Blood Glucose POCT: 216 mg/dL — ABNORMAL HIGH (ref 70–100)
Whole Blood Glucose POCT: 219 mg/dL — ABNORMAL HIGH (ref 70–100)
Whole Blood Glucose POCT: 267 mg/dL — ABNORMAL HIGH (ref 70–100)

## 2013-04-27 LAB — PHOSPHORUS: Phosphorus: 3.6 mg/dL (ref 2.3–4.7)

## 2013-04-27 LAB — BASIC METABOLIC PANEL
Anion Gap: 13 (ref 5.0–15.0)
BUN: 59.2 mg/dL — ABNORMAL HIGH (ref 9.0–21.0)
CO2: 24 mEq/L (ref 22–29)
Calcium: 7.5 mg/dL — ABNORMAL LOW (ref 8.5–10.5)
Chloride: 111 mEq/L — ABNORMAL HIGH (ref 98–107)
Creatinine: 2.9 mg/dL — ABNORMAL HIGH (ref 0.7–1.3)
Glucose: 237 mg/dL — ABNORMAL HIGH (ref 70–100)
Potassium: 4.2 mEq/L (ref 3.5–5.1)
Sodium: 148 mEq/L — ABNORMAL HIGH (ref 136–145)

## 2013-04-27 LAB — GFR: EGFR: 23.3

## 2013-04-27 LAB — VANCOMYCIN, RANDOM: Vancomycin Random: 12 ug/mL

## 2013-04-27 LAB — MAGNESIUM: Magnesium: 2.5 mg/dL (ref 1.6–2.6)

## 2013-04-27 SURGERY — NON-TUNNELED CATH PLACEMENT
Site: Chest | Laterality: Right

## 2013-04-27 MED ORDER — MIDAZOLAM INFUSION 1MG/ML--OUTSOURCED
1.0000 mg/h | INTRAVENOUS | Status: DC
Start: 2013-04-27 — End: 2013-04-29
  Administered 2013-04-28 – 2013-04-29 (×2): 4 mg/h via INTRAVENOUS
  Filled 2013-04-27 (×4): qty 100

## 2013-04-27 MED ORDER — INSULIN GLARGINE 100 UNIT/ML SC SOLN
25.0000 [IU] | Freq: Every evening | SUBCUTANEOUS | Status: DC
Start: 2013-04-27 — End: 2013-04-29
  Administered 2013-04-27 – 2013-04-28 (×2): 25 [IU] via SUBCUTANEOUS
  Filled 2013-04-27 (×2): qty 250

## 2013-04-27 MED ORDER — VANCOMYCIN HCL 1000 MG IV SOLR
1500.0000 mg | Freq: Once | INTRAVENOUS | Status: DC
Start: 2013-04-27 — End: 2013-04-27
  Filled 2013-04-27: qty 1500

## 2013-04-27 MED ORDER — SODIUM CHLORIDE 0.9 % IV SOLN
500.0000 mg | INTRAVENOUS | Status: DC
Start: 2013-04-28 — End: 2013-04-30
  Administered 2013-04-28 – 2013-04-29 (×2): 500 mg via INTRAVENOUS
  Filled 2013-04-27 (×3): qty 500

## 2013-04-27 MED ORDER — MIDAZOLAM HCL 2 MG/2ML IJ SOLN
2.0000 mg | INTRAMUSCULAR | Status: DC | PRN
Start: 2013-04-27 — End: 2013-05-23
  Administered 2013-05-01 – 2013-05-17 (×17): 2 mg via INTRAVENOUS
  Filled 2013-04-27 (×17): qty 2

## 2013-04-27 MED ORDER — PROPOFOL INFUSION 10 MG/ML
0.0000 ug/kg/min | INTRAVENOUS | Status: DC
Start: 2013-04-27 — End: 2013-05-15
  Administered 2013-04-27: 50 ug/kg/min via INTRAVENOUS
  Administered 2013-04-28: 45 ug/kg/min via INTRAVENOUS
  Administered 2013-04-28: 50 ug/kg/min via INTRAVENOUS
  Administered 2013-04-28: 40 ug/kg/min via INTRAVENOUS
  Administered 2013-04-28: 45 ug/kg/min via INTRAVENOUS
  Administered 2013-04-28: 40 ug/kg/min via INTRAVENOUS
  Administered 2013-04-28: 45 ug/kg/min via INTRAVENOUS
  Administered 2013-04-28: 50 ug/kg/min via INTRAVENOUS
  Administered 2013-04-28 (×2): 45 ug/kg/min via INTRAVENOUS
  Administered 2013-04-29: 35 ug/kg/min via INTRAVENOUS
  Administered 2013-04-29: 40 ug/kg/min via INTRAVENOUS
  Administered 2013-04-29 (×2): 50 ug/kg/min via INTRAVENOUS
  Administered 2013-04-29: 30 ug/kg/min via INTRAVENOUS
  Administered 2013-04-29: 45 ug/kg/min via INTRAVENOUS
  Administered 2013-04-29: 35 ug/kg/min via INTRAVENOUS
  Administered 2013-04-30: 50 ug/kg/min via INTRAVENOUS
  Administered 2013-04-30 (×3): 40 ug/kg/min via INTRAVENOUS
  Administered 2013-04-30: 30 ug/kg/min via INTRAVENOUS
  Administered 2013-04-30: 50 ug/kg/min via INTRAVENOUS
  Administered 2013-04-30 (×2): 40 ug/kg/min via INTRAVENOUS
  Administered 2013-04-30: 50 ug/kg/min via INTRAVENOUS
  Administered 2013-05-01: 30 ug/kg/min via INTRAVENOUS
  Administered 2013-05-01: 40 ug/kg/min via INTRAVENOUS
  Administered 2013-05-01: 30 ug/kg/min via INTRAVENOUS
  Administered 2013-05-01 (×3): 40 ug/kg/min via INTRAVENOUS
  Administered 2013-05-01: 30 ug/kg/min via INTRAVENOUS
  Administered 2013-05-02: 40 ug/kg/min via INTRAVENOUS
  Administered 2013-05-02 (×2): 30 ug/kg/min via INTRAVENOUS
  Administered 2013-05-02: 40 ug/kg/min via INTRAVENOUS
  Administered 2013-05-02 (×2): 50 ug/kg/min via INTRAVENOUS
  Administered 2013-05-02: 40 ug/kg/min via INTRAVENOUS
  Administered 2013-05-03: 50 ug/kg/min via INTRAVENOUS
  Administered 2013-05-03 (×3): 30 ug/kg/min via INTRAVENOUS
  Administered 2013-05-03 (×2): 50 ug/kg/min via INTRAVENOUS
  Administered 2013-05-03 – 2013-05-05 (×9): 30 ug/kg/min via INTRAVENOUS
  Administered 2013-05-05: 50 ug/kg/min via INTRAVENOUS
  Administered 2013-05-05 (×2): 30 ug/kg/min via INTRAVENOUS
  Administered 2013-05-05 (×2): 50 ug/kg/min via INTRAVENOUS
  Administered 2013-05-05: 30 ug/kg/min via INTRAVENOUS
  Administered 2013-05-05 (×2): 50 ug/kg/min via INTRAVENOUS
  Administered 2013-05-06: 35 ug/kg/min via INTRAVENOUS
  Administered 2013-05-06: 50 ug/kg/min via INTRAVENOUS
  Administered 2013-05-06: 20 ug/kg/min via INTRAVENOUS
  Administered 2013-05-06: 50 ug/kg/min via INTRAVENOUS
  Administered 2013-05-06: 25 ug/kg/min via INTRAVENOUS
  Administered 2013-05-06 (×2): 50 ug/kg/min via INTRAVENOUS
  Administered 2013-05-07: 20 ug/kg/min via INTRAVENOUS
  Administered 2013-05-09: 10 ug/kg/min via INTRAVENOUS
  Administered 2013-05-09 – 2013-05-10 (×4): 20 ug/kg/min via INTRAVENOUS
  Administered 2013-05-11: 30 ug/kg/min via INTRAVENOUS
  Administered 2013-05-11: 20 ug/kg/min via INTRAVENOUS
  Administered 2013-05-12 – 2013-05-14 (×18): 40 ug/kg/min via INTRAVENOUS
  Filled 2013-04-27 (×93): qty 100

## 2013-04-27 MED ORDER — BUMETANIDE 0.25 MG/ML IJ SOLN
1.0000 mg | Freq: Once | INTRAMUSCULAR | Status: AC
Start: 2013-04-27 — End: 2013-04-27
  Administered 2013-04-27: 1 mg via INTRAVENOUS
  Filled 2013-04-27: qty 10

## 2013-04-27 NOTE — Progress Notes (Signed)
Shift Summary: Pt had episode of overbreathing vent and use of abdominal muscles to breathe, with facial grimacing noted. Dr Lesle Reek in to eval pt. Sedation and pain medication increased increased with relief. Suctioned for small amount light yellow ett secretions. Abdomen large, distended but soft. Tube feedings not well tolerated with high residuals. Urine output remains adequate. Skin on sacrum has small shearing type wound without exudate. Pt's wife updated by team today.

## 2013-04-27 NOTE — Progress Notes (Signed)
[facility]- Critical Care Note     ICU Daily Progress Note        Date Time: 04/27/2013 9:23 PM  Patient Name: Adam Simmons  Attending Physician: Lawernce Ion, MD  Room: IC05/IC05-A   Admit Date: 04/24/2013  LOS: 3 days            Assessment:     Patient Active Problem List   Diagnosis   . Respiratory failure   . Pneumonia   . Hypokalemia   . ARDS (adult respiratory distress syndrome)   . DM (diabetes mellitus)   . AKI (acute kidney injury)   Ventilator day 3  Pneumococcal pneumonia, sepsis  AKI, worsening.  Uncontrolled diabetes    Plan:   Quinton catheter placed for possible Hemodialysis  Vancomycin d/c'd, cont invanz, levaquin  Bronchodilators  Change sedation to propofol  Bumex.  Increase insulin    Subjective:   Sedated on vent, tachypneic    Medications:       Scheduled Meds: PRN Meds:         albuterol-ipratropium 3 mL Nebulization Q4H SCH   chlorhexidine 15 mL Mouth/Throat BID   docusate 100 mg Oral BID   enoxaparin 40 mg Subcutaneous Daily   [START ON 04/28/2013] ertapenem 500 mg Intravenous Q24H SCH   famotidine 20 mg per OG tube BID   insulin aspart 1-12 Units Subcutaneous Q4H   insulin glargine 15 Units Subcutaneous QHS   lactobacillus/streoptococcus 1 capsule Oral Daily   levofloxacin 750 mg Intravenous Q48H   methylprednisolone 20 mg Intravenous Q12H SCH   polyethylene glycol 17 g Oral BID   thiamine 100 mg Oral Daily   [DISCONTINUED] ertapenem 1,000 mg Intravenous Q24H [facility]   [DISCONTINUED] vancomycin 1,500 mg Intravenous Once       Continuous Infusions:       . fentaNYL 150 mcg/hr (04/27/13 2000)   . midazolam (VERSED) infusion 16 mg/hr (04/27/13 2000)   . phenylephrine 20 mcg/min (04/27/13 2000)   . propofol Stopped (04/26/13 1258)         acetaminophen 500 mg Q4H PRN   albuterol 2.5 mg Q1H PRN   dextrose 15 g PRN   dextrose 25 mL PRN   fentaNYL 25 mcg Q2H PRN   glucagon (rDNA) 1 mg PRN   LORazepam 2 mg Q1H PRN   vancomycin 1,000 mg Q12H PRN   vecuronium 10 mg Q6H PRN             Physical  Exam:     Filed Vitals:    04/27/13 1930 04/27/13 2000 04/27/13 2009 04/27/13 2030   BP:       Pulse: 93 94  101   Temp:       TempSrc:       Resp:       Height:       Weight:       SpO2: 95% 93% 95% 95%     No data recorded.           02/05 0701 - 02/06 0700  In: 5491.4 [I.V.:3109.4]  Out: 1300 [Urine:1300]       General Appearance: on vent, obese  Mental status: sedated  Neuro:  Moves ext  H & N: no jvd  Lungs: rhonchi bilat  Cardiac: reg  Abdomen:  Mildly distended, bs decreased  Extremities: trace edema  Skin: warm      Data:       Vent Settings:    Vent Settings  Vent Mode: PRVC  FiO2: 50 %  Resp Rate (Set): 28   Vt (Set, mL): 380 mL  PIP Observed (cm H2O): 29 cm H2O  PEEP/EPAP: 10 cm H20  Pressure Support / IPAP: 10 cmH20  Mean Airway Pressure: 15 cmH20      Labs:     Recent CBC   Lab 04/27/13 0408 04/26/13 0304 04/25/13 0726   WBC 19.16* 14.13* 19.86*   RBC 3.92* 4.06* 4.33*   HGB 11.1* 11.5* 12.3*   HCT 36.0* 36.7* 39.0*   MCV 91.8 90.4 90.1   PLT 134* 121* 160         Lab 04/27/13 0408 04/26/13 0304 04/25/13 1827 04/25/13 1401 04/25/13 0730 04/25/13 0219 04/25/13 0117 04/24/13 1759 04/24/13 1103   NA 148* 152* 148* -- -- -- -- -- --   K 4.2 4.0 3.6 -- -- -- -- -- --   CL 111* 112* 112* -- -- -- -- -- --   CO2 24 25 24  -- -- -- -- -- --   GLU 237* 346* 308* -- -- -- -- -- --   BUN 59.2* 39.3* 36.5* -- -- -- -- -- --   CREAT 2.9* 2.2* 2.0* -- -- -- -- -- --   MG 2.5 2.0 1.9 -- -- -- -- -- --   PHOS 3.6 3.0 3.1 -- -- -- -- -- --   AST -- -- 114* -- 191* -- -- -- 42*   ALT -- -- 125* -- 126* -- -- -- 44   ALKPHOS -- -- 48 -- 49 -- -- -- 58   BILITOTAL -- -- 1.4* -- 2.0* -- -- -- 1.4*   BILIDIRECT -- -- -- -- -- -- -- -- --   LIP -- -- -- -- -- -- -- -- --   BNP -- -- -- -- -- -- -- -- 494.6*   PT -- -- -- -- -- -- -- -- --   INR -- -- -- -- -- -- -- -- --   PTT -- -- -- -- -- -- -- -- --   DDIMER -- -- -- -- -- -- -- -- 1.35*   CK -- -- -- 283* -- 73 -- 101 --   CKMB -- -- -- 3.1 -- -- -- -- --   TROPI -- -- --  0.12* -- -- 0.01 0.02 --   MYOGLOBIN -- -- -- -- -- -- -- -- --           Rads:     Radiology Results (24 Hour)     Procedure Component Value Units Date/Time    Non-Tunneled Cath Placement [facility]) [[id number]] Collected:04/27/13 1522    Order Status:Completed  Updated:04/27/13 1527    Narrative:    HISTORY: Acute kidney injury, requiring access for hemodialysis.    Temporary hemodialysis access catheter placement performed under  ultrasound guidance::    PROCEDURE:  The nature of the procedure, risks, benefits, and  alternatives were discussed with the patient's wife. All questions were  answered and consent was obtained.    Ultrasound was performed to assess the jugular veins prior to catheter  placement. The right internal jugular vein is normally compressible .     The procedure was performed using standard sterile technique. The  intended puncture site was infiltrated with local anesthetic injected  subcutaneously.  Puncture of the vein was performed at bedside under  direct sonographic guidance with a 21 gauge single wall needle.  Sonographic images were recorded pre- and post- puncture for  documentation.  A 0.018 inch guidewire was advanced into the vessel. The  needle was removed and a 5 French coaxial dilator system was placed. The  outer dilator was exchanged over a 3 mm J-wire for series of fascial  dilators to accommodate a temporary dialysis catheter. The catheter was  then placed.  Excellent blood return and fluid infusion was confirmed.   The lumina were flushed and capped. The catheter was secured with 0  Prolene suture material and a sterile dressing was applied.      Impression:      Successful placement of a 16 cm triple-lumen temporary  hemodialysis access catheter via right internal jugular venous approach  under ultrasound guidance.  A chest x-ray will be obtained to assess tip  position.         Joselyn Arrow, MD   04/27/2013 3:23 PM    XR Chest AP Portable [[id number]] Collected:04/27/13 1445     Order Status:Completed  Updated:04/27/13 1452    Narrative:    HISTORY: Status post Quinton catheter placement.    COMPARISON: 04/26/2013.    FINDINGS: Single portable view of the chest was obtained. A new right  internal jugular vein approach catheter terminates in the region of the  distal superior vena cava. There is no pneumothorax. A left internal  jugular central venous catheter terminating near the venous confluence,  an endotracheal tube terminating approximately 2.9 cm above the carina,  and an enteric tube crossing the diaphragm are unchanged. Please note  that the tip of the enteric tube is not included on this film. Diffuse  bilateral consolidation indicative of pneumonia is unchanged. There is  no large pleural effusion. Cardiac size is unchanged.      Impression:      1. Negative for pneumothorax status post right internal jugular central  venous catheter terminating in the region of the distal superior vena  cava.  2. No change in additional lines and tubes.  3. Bilateral pneumonia.    Elizebeth Koller, MD   04/27/2013 2:47 PM          Radiological Imaging personally reviewed.    I have personally reviewed the patient's history and 24 hour interval events, along with vitals, labs, radiology images and  ventilator settings and additional findings found in detail within ICU team notes, with their care plans developed with and reviewed by me.         Signed by: Durward Fortes, MD  Date/Time: 04/27/2013 9:23 PM

## 2013-04-27 NOTE — Plan of Care (Signed)
Problem: Pain  Goal: Patient's pain/discomfort is manageable  Outcome: Progressing  Continue to monitor pain and use iv fentanyl titrating to goal.    Problem: Impaired Mobility  Goal: Mobility/activity is maintained at optimum level for patient  Outcome: Not Progressing  When pt meets criteria, progressive mobility protocol to be initiated. Turn and reposition, ROM as tolerated.    Problem: Nutrition  Goal: Patient's nutritional intake is adequate  Assess and monitor food intake and supplements, patient food preferences, nausea, vomiting, labs, oral cavity (gums, teeth, tongue, mucosa), proper denture fit, and cultural beliefs. Monitor for signs of hypoglycemia and hyperglycemia. Collaborate with interdisciplinary team and initiate plan and interventions as ordered.  Outcome: Not Progressing  Pt not tolerating tube feed, with high residuals, and no stool since admit.

## 2013-04-27 NOTE — PT Eval Note (Signed)
Physical Therapy Cancellation Note    Patient: Adam Simmons  ZOX:[id number]    Unit: IC05/IC05-A    Patient not seen for physical therapy secondary to continued elevated vent settings (PEEP 12). Will follow up on Monday 04/30/13.      Delfin Edis, PT, DPT  Pager #: [phone]

## 2013-04-27 NOTE — H&P (Signed)
BRIEF VIR H&P    Date Time: 04/27/2013 2:01 PM    PROCEDURALIST COMMENTS BELOW:   AKI requiring hemodialysis.    INDICATIONS:   Procedure(s):  NON-TUNNELED CATH PLACEMENT    Preoperative Diagnosis:   Pre-Op Diagnosis Codes:     * AKI (acute kidney injury) [584.9]      PAST MEDICAL HISTORY:     Past Medical History   Diagnosis Date   . Hypertension    . Diabetes mellitus    . Kidney stones        PAST SURGICAL HISTORY     Past Surgical History   Procedure Date   . Kidney stone surgery          REVIEW OF SYSTEMS REVIEWED:   YES  ( x )      CURRENT MEDICATION REVIEWED   YES  ( x )      ALLERGIES:   No Known Allergies      PREVIOUS REACTION TO SEDATION MEDICATIONS   NO ( x)   YES ( )      PHYSICAL EXAM     AIRWAY CLASSIFICATION:  CLASS I   (  )     CLASS II  (   )    CLASS III  (  )     CLASS IV  (  )  INTUBATED ( x )    CARDIAC:   ( x )  RRR  (  )  IRREG  (  )  MURMUR    LUNGS:   ( x )  CLEAR  (  )  DIMINISHED    (  ) LEFT   (  )  RIGHT  (  )  ABSENT          (  ) LEFT   (  )  RIGHT  (  )  TUBES            (  ) LEFT   (  )  RIGHT      ABDOMEN:   Soft NT    NEURO:   SEDATED      LABS:     Lab Results   Component Value Date/Time    WBC 19.16* 04/27/2013  4:08 AM    HCT 36.0* 04/27/2013  4:08 AM    BUN 59.2* 04/27/2013  4:08 AM    CREAT 2.9* 04/27/2013  4:08 AM    GLU 237* 04/27/2013  4:08 AM    K 4.2 04/27/2013  4:08 AM       ASA PHYSICAL STATUS   (  )  ASA 1   HEALTHY PATIENT  (  )  ASA 2   MILD SYSTEMIC ILLNESS  (  )  ASA 3   SYSTEMIC DISEASE, NOT INCAPACITATING  (x  )  ASA 4   SEVERE SYSTEMIC DISEASE, IS CONSTANT THREAT TO  LIFE  (  )  ASA 5   MORIBUND CONDITION, NOT EXPECTED TO LIVE >24 HOURS            IRRESPECTIVE OF PROCEDURE  (x  )  E           EMERGENCY PROCEDURE       PLANNED SEDATION:   ( x ) NO SEDATION  (  ) MODERATE SEDATION  (  ) DEEP SEDATION WITH ANESTHESIA      CONCLUSION:   PATIENT HAS BEEN REASSESSED IMMEDIATELY PRIOR TO THE PROCEDURE   AND IS AN APPROPRIATE CANDIDATE FOR  THE PLANNED PROCEDURE.  RISKS, BENEFITS  AND ALTERNATIVES TO THE PLANNED PROCEDURE HAVE BEEN EXPLAINED TO THE WIFE THOROUGHLY. MAJOR RISKS RE INFECTION, BLEEDING, INTERNAL ORGAN INJURY, AND RECURRENCE WERE DISCUSSED.    (x)  YES  (  )  EMERGENCY CONSENT     Signed by Vickki Muff, M.D.    Interventional Radiologist  [facility]- Division of Vascular & Interventional Radiology    Contact Numbers:  Regular business hours (8A-5P M-F):  [facility]:  [phone]  [facility]:  [phone]    After hours:    Answering service:  [phone]

## 2013-04-27 NOTE — OT Eval Note (Signed)
Occupational Therapy Cancellation Note    Patient: Adam Simmons  NWG:[id number]    Unit: IC05/IC05-A    Patient not seen for occupational therapy secondary to Patient continues with high vent settings (PEEP 12). Not appropriate for OT Evaluation at this time. P: Will continue with attempts as appropriate.     Tennis Ship. Trixie Deis, MS,OTR/L  Pager # [phone]  [phone]

## 2013-04-27 NOTE — Progress Notes (Signed)
CM printed a list of Day Care Centers in the [place] area and gave it to pt's wife, Judeth Cornfield, in the waiting area as she requested.  She will call her self to the day care center for her 49 years old child to get some assistance.  As per nurse, Lynden Ang, pt's wife can not visit the patient with 2 yrs old child.  CM will be available as needed.

## 2013-04-27 NOTE — Progress Notes (Signed)
Nutritional Support Services  Nutrition Follow Up    Adam Simmons 48 y.o. male   MRN: [id number]        Nutrition Summary/Diet history:  Pt is vented and sedated. Per RN, no bowel since admission and +250 ml residuals today.  Propofol is off.  Nutrition Diagnosis:   Inadequate oral intake related to respiratory failure as evidenced by vent support.    Intervention:  1. Continue with Promote @ 50 mL/hr = 1200 kcals, 75 g pro and 1006 mL free water. Once pt has bowel movement and remains off propofol, may advance to goal of 60 ml/hr = 1440 kcals, 90 gm protein and 1209 ml free water.  2. Continue with ProSource NoCarb 1 packet q 6 hrs = 240 kcals and 60 g pro   3. Free water flush per MD.     Goal: Meet estimated needs with enteral nutrition support while vented.    Monitoring:   Evaluation:   1. Residuals < 500 mL +250 today  2. Weights   Morbid obese  3. Net I&O   + 14.6 L since admission, no BM yet    Nutrition risk level: High    Assessment Data:  Adm dx:  Respiratory failure   Patient Active Problem List   Diagnosis   . Respiratory failure   . Pneumonia   . Hypokalemia   . ARDS (adult respiratory distress syndrome)   . DM (diabetes mellitus)   . AKI (acute kidney injury)   PMH:  has a past medical history of Hypertension; Diabetes mellitus; and Kidney stones.  PSH:  has past surgical history that includes Kidney stone surgery.  Pertinent labs:    Lab 04/27/13 0408 04/26/13 0304 04/25/13 1827 04/25/13 1401 04/25/13 0730 04/25/13 0219   NA 148* 152* 148* 147* 143 --   K 4.2 4.0 3.6 3.8 4.0 --   CL 111* 112* 112* 111* 111* --   CO2 24 25 24 23  20* --   BUN 59.2* 39.3* 36.5* 36.3* 37.0* --   CREAT 2.9* 2.2* 2.0* 1.8* 1.6* --   GLU 237* 346* 308* 317* 321* --   CA 7.5* 7.2* 7.2* 7.2* 7.0* --   MG 2.5 2.0 1.9 1.8 -- 1.8   PHOS 3.6 3.0 3.1 -- -- --   EGFR 23.3 32.0 35.7 40.3 46.2 --   Blood glucose range (48 hrs) 216-337 mg/dL  Pertinent meds: chlorhexidine, docusate, famotidine, insulin aspart,  lactobacillus/streoptococcus, methylprednisolone, polyethylene glycol, thiamine, fentanyl  Social history:  married  Food Intolerance/Religious/Ethnic preferences: none noted  Diet Order:  Orders Placed This Encounter   Procedures   . Diet NPO effective now   . Prosource no carb Frequency:: All meals; Quantity:: A. One One packet q 6 hrs.   . Tube feeding-CONTINUOUS   GI symptoms: abd distended, no BM since admission  Hydration: +3 generalized edema  Skin: Abrasions  Anthropometrics  Height: 172.7 cm (5\' 8" )  Weight: 134.7 kg (296 lb 15.4 oz)  Weight Change: 0.22   IBW/kg (Calculated) Male: 70.02 kg  IBW/kg (Calculated) Male: 63.62 kg  BMI (calculated): 42.6   Wt Readings from Last 30 Encounters:   04/26/13 134.7 kg (296 lb 15.4 oz)   Learning Needs: no  Estimated Needs:  Estimated Energy Needs  Total Energy Estimated Needs: 1680-1820 kcals/kg  Method for Estimating Needs: 24-26 kcals/kg IBW  Estimated Protein Needs  Total Protein Estimated Needs: 140 g pro/kg  Method for Estimating Needs: 2 g protein/kg  Fluid Needs  Method  for Estimating Needs: per MD  No Known Allergies    Caprice Renshaw, MS, RD  Ext. [phone]

## 2013-04-27 NOTE — Progress Notes (Signed)
[facility] [employer].  Progress Note    Assessment:  1.AKI- non-oliguric - heading toward needing renal replacement therapy  2. Pneumonia with ARDS- on ventilator  3. Septic shock on pressors  4. Severe acidosis - combined respiratory and metabolic  5. DM on Metformin  6. HTN  7. Kidney stones  8. Exposure to NSAIDs prior to admission  9. Hypernatremia    Plan:  1.Ventilator support  2. Hemodynamic support  3. IV fluid - change to D5W   4. Daily labs  5. Adjust meds based on GFR < 30  6. Requested a Quinton catheter 04/27/13.  6. PEG feeding with water flushes    Adam Drape, MD  Office - [phone]  ++++++++++++++++++++++++++++++++++++++++++++++++++++++++++++++  Subjective:  Intubated on vent    Medications:  Scheduled Meds:  Current Facility-Administered Medications   Medication Dose Route Frequency   . albuterol-ipratropium  3 mL Nebulization Q4H SCH   . chlorhexidine  15 mL Mouth/Throat BID   . docusate  100 mg Oral BID   . enoxaparin  40 mg Subcutaneous Daily   . ertapenem  1,000 mg Intravenous Q24H SCH   . famotidine  20 mg per OG tube BID   . insulin aspart  1-12 Units Subcutaneous Q4H   . [COMPLETED] insulin aspart  15 Units Subcutaneous Once   . insulin glargine  15 Units Subcutaneous QHS   . lactobacillus/streoptococcus  1 capsule Oral Daily   . levofloxacin  750 mg Intravenous Q48H   . methylprednisolone  20 mg Intravenous Q12H SCH   . polyethylene glycol  17 g Oral BID   . thiamine  100 mg Oral Daily   . [DISCONTINUED] insulin aspart  1-8 Units Subcutaneous Q4H   . [DISCONTINUED] vancomycin  1,500 mg Intravenous Once     Continuous Infusions:       . fentaNYL 80 mcg/hr (04/27/13 0600)   . midazolam (VERSED) infusion 12 mg/hr (04/27/13 0600)   . phenylephrine 30 mcg/min (04/27/13 0600)   . propofol Stopped (04/26/13 1258)   . [DISCONTINUED] IV fluids with sodium bicarbonate 100 mL/hr (04/26/13 0728)   . [DISCONTINUED] dextrose Stopped (04/26/13 2158)     PRN Meds:acetaminophen,  albuterol, dextrose, dextrose, fentaNYL, glucagon (rDNA), LORazepam, vancomycin, vecuronium    Objective:  Vital signs in last 24 hours:  Heart Rate:  [86-97] 95   Resp Rate:  [28] 28   BP: (73-110)/(38-73) 102/62 mmHg  Arterial Line BP: (71-124)/(42-79) 115/57 mmHg  FiO2:  [50 %-70 %] 50 %    Intake/Output from yesterday (07:01 - 07:00):  02/05 0701 - 02/06 0700  In: 5491.4 [I.V.:3109.4]  Out: 1300 [Urine:1300]     Physical Exam:   Gen: WD WN NAD   CV: S1 S2 N RRR   Chest: CTAB   Ab: ND NT soft no HSM +BS   Ext: No C/E    Labs:    Lab 04/27/13 0408 04/26/13 0304 04/25/13 1827 04/25/13 0730 04/24/13 1103   GLU 237* 346* 308* -- --   BUN 59.2* 39.3* 36.5* -- --   CREAT 2.9* 2.2* 2.0* -- --   CA 7.5* 7.2* 7.2* -- --   NA 148* 152* 148* -- --   K 4.2 4.0 3.6 -- --   CL 111* 112* 112* -- --   CO2 24 25 24  -- --   ALB -- -- 2.1* 2.3* 3.4*   PHOS 3.6 3.0 3.1 -- --   MG 2.5 2.0 1.9 -- --  Lab 04/27/13 0408 04/26/13 0304 04/25/13 0726   WBC 19.16* 14.13* 19.86*   HGB 11.1* 11.5* 12.3*   HCT 36.0* 36.7* 39.0*   MCV 91.8 90.4 90.1   MCH 28.3 28.3 28.4   MCHC 30.8* 31.3* 31.5*   RDW 16* 16* 16*   MPV 11.5 11.4 11.9   PLT 134* 121* 160

## 2013-04-27 NOTE — Progress Notes (Signed)
Infectious Disease            Progress Note    04/27/2013   Adam Simmons FBP:[id number] is a 48 y.o. male, history of hypertension, diabetes mellitus, kidney stones. Admitted with septic shock, pneumonia, respiratory failure.    Subjective:     Adam Simmons today Symptoms:  Worsening kidney functions , leukocytosis, possible will require dialysis. On ventilator, on pressors, blood cultures growing strep pneumoniae. Afebrile.    Objective:     Blood pressure 102/62, pulse 95, temperature 98.6 F (37 C), temperature source Temporal Artery, resp. rate 28, height 1.727 m (5\' 8" ), weight 134.7 kg (296 lb 15.4 oz), SpO2 95.00%.    General Appearance: Intubated and sedated.    HEENT: Pallor negative, Anicteric sclera.  Intubated  Lungs:  Bilateral decreased breath sounds; bilateral crackles  Heart: Normal rate.  S1 and S2.    Chest Wall: Symmetric chest wall expansion.   Abdomen: Abdomen is soft, scaphoid and non-distended. There are no signs of ascites. Bowel sounds are normal.   Neurological: Patient is intubated and sedated    Laboratory And Diagnostic Studies:     Recent Labs   [facility] 04/27/13 0408 04/26/13 0304 04/25/13 0726 04/24/13 1103    WBC 19.16* 14.13* -- --    HGB 11.1* 11.5* -- --    HCT 36.0* 36.7* -- --    PLT 134* 121* -- --    NEUTRO -- -- 56 46     Recent Labs   Basename 04/27/13 0408 04/26/13 0304    NA 148* 152*    K 4.2 4.0    CL 111* 112*    CO2 24 25    BUN 59.2* 39.3*    CREAT 2.9* 2.2*    GLU 237* 346*    CA 7.5* 7.2*     Recent Labs   Basename 04/25/13 1827 04/25/13 0730    AST 114* 191*    ALT 125* 126*    ALKPHOS 48 49    PROT 5.1* 5.2*    ALB 2.1* 2.3*    BILITOTAL 1.4* 2.0*     Blood cultures:  Strep pneumoniae      Current Med's:     Current Facility-Administered Medications   Medication Dose Route Frequency   . albuterol-ipratropium  3 mL Nebulization Q4H SCH   . chlorhexidine  15 mL Mouth/Throat BID   . docusate  100 mg Oral BID   . enoxaparin  40 mg Subcutaneous  Daily   . ertapenem  1,000 mg Intravenous Q24H SCH   . famotidine  20 mg per OG tube BID   . insulin aspart  1-12 Units Subcutaneous Q4H   . [COMPLETED] insulin aspart  15 Units Subcutaneous Once   . insulin glargine  15 Units Subcutaneous QHS   . lactobacillus/streoptococcus  1 capsule Oral Daily   . levofloxacin  750 mg Intravenous Q48H   . methylprednisolone  20 mg Intravenous Q12H SCH   . polyethylene glycol  17 g Oral BID   . thiamine  100 mg Oral Daily   . [DISCONTINUED] insulin aspart  1-8 Units Subcutaneous Q4H   . [DISCONTINUED] vancomycin  1,500 mg Intravenous Once       Line, Drains, Airways:     NG/OG Tube Orogastric 16 Fr. Center mouth (Active)   Number of days:3       Urethral Catheter Non-latex 16 Fr. (Active)   Number of days:3         Arterial Line 04/24/13  Left Radial (Active)   Number of days:3       CVC Triple Lumen 04/24/13 Left Internal jugular (Active)   Number of days:3     Assessment:      Condition.  Guarded   Septic shock   Strep pneumoniae sepsis     Respiratory failure, status post intubation   Bilateral pneumonia   Possible aspiration   Diabetes mellitus   Acute kidney injury   Thrombocytopenia    Elevated liver function tests    Plan:      Continue  Invanz; decrease dose to 500   Continue Levaquin   Discontinue  vancomycin   Continue pressor support   Continue ventilatory support   Continue supportive care   Correction of electrolytes   Nephrology follow up   Discussed with Dr. Cleophus Molt; possible require dialysis          Alfonzo Beers, M.D.,FACP  04/27/2013  8:59 AM

## 2013-04-27 NOTE — Brief Op Note (Signed)
BRIEF OP NOTE    Date Time: 04/27/2013 2:03 PM    Patient Name:   Sevag Shearn    Date of Operation:   04/24/2013 - 04/27/2013    Providers Performing:   Surgeon(s):  Vickki Muff, MD    Assistant (s): Milinda Hirschfeld, RCIS    Operative Procedure:   Procedure(s):  NON-TUNNELED CATH PLACEMENT    Preoperative Diagnosis:   Pre-Op Diagnosis Codes:     * AKI (acute kidney injury) [584.9]    Postoperative Diagnosis:   * No post-op diagnosis entered *    Anesthesia:   (  ) FENTANYL  (  ) VERSED  ( x ) LOCAL  (  ) GENERAL ANESTHESIA (DEPT OF ANESTHESIOLOGY) )    Estimated Blood Loss:    5 mL      CONTRAST   none      RADIATION DOSE   0 MINUTES FLUORO TIME  0 mGy      Findings:   1.  SUCCESSFUL PLACEMENT OF 16 CM RIGHT INTERNAL JUGULAR VEIN TRIPLE LUMEN TEMPORARY DIALYSIS CATHETER  2.  CATHETER TIP POSITION TO BE ASSESSED WITH CHEST X- RAY    Complications:   NONE      Signed by: Vickki Muff, MD                                                                              [place] IVR

## 2013-04-28 LAB — GLUCOSE WHOLE BLOOD - POCT
Whole Blood Glucose POCT: 203 mg/dL — ABNORMAL HIGH (ref 70–100)
Whole Blood Glucose POCT: 240 mg/dL — ABNORMAL HIGH (ref 70–100)
Whole Blood Glucose POCT: 241 mg/dL — ABNORMAL HIGH (ref 70–100)
Whole Blood Glucose POCT: 257 mg/dL — ABNORMAL HIGH (ref 70–100)
Whole Blood Glucose POCT: 264 mg/dL — ABNORMAL HIGH (ref 70–100)
Whole Blood Glucose POCT: 277 mg/dL — ABNORMAL HIGH (ref 70–100)

## 2013-04-28 LAB — ARTERIAL BLOOD GAS (~~LOC~~)
Base Excess, Arterial: 3.5 mEq/L — ABNORMAL HIGH (ref <=2.0)
FIO2: 50 %
HCO3, Arterial: 29.6 mEq/L — ABNORMAL HIGH (ref 22.0–28.0)
O2 Sat, Arterial: 93 % — ABNORMAL LOW (ref 95.0–100.0)
PEEP: 10
Rate: 28
Temperature: 98.6
Tidal vol.: 380
pCO2, Arterial: 50 — ABNORMAL HIGH (ref 35.0–45.0)
pH, Arterial: 7.38 (ref 7.35–7.45)
pO2, Arterial: 69 — ABNORMAL LOW (ref 80.0–100.0)

## 2013-04-28 LAB — HEPATITIS B SURFACE ANTIGEN W/ REFLEX TO CONFIRMATION: Hepatitis B Surface Antigen: NONREACTIVE

## 2013-04-28 LAB — CBC
Hematocrit: 34.3 % — ABNORMAL LOW (ref 42.0–52.0)
Hgb: 10.7 g/dL — ABNORMAL LOW (ref 13.0–17.0)
MCH: 28.5 pg (ref 28.0–32.0)
MCHC: 31.2 g/dL — ABNORMAL LOW (ref 32.0–36.0)
MCV: 91.5 fL (ref 80.0–100.0)
MPV: 11.9 fL (ref 9.4–12.3)
Nucleated RBC: 0 (ref 0–1)
Platelets: 137 10*3/uL — ABNORMAL LOW (ref 140–400)
RBC: 3.75 10*6/uL — ABNORMAL LOW (ref 4.70–6.00)
RDW: 16 % — ABNORMAL HIGH (ref 12–15)
WBC: 15.56 10*3/uL — ABNORMAL HIGH (ref 3.50–10.80)

## 2013-04-28 LAB — HEMOLYSIS INDEX: Hemolysis Index: 29 — ABNORMAL HIGH (ref 0–18)

## 2013-04-28 LAB — BASIC METABOLIC PANEL
Anion Gap: 14 (ref 5.0–15.0)
BUN: 80.2 mg/dL — ABNORMAL HIGH (ref 9.0–21.0)
CO2: 25 mEq/L (ref 22–29)
Calcium: 7.8 mg/dL — ABNORMAL LOW (ref 8.5–10.5)
Chloride: 109 mEq/L — ABNORMAL HIGH (ref 98–107)
Creatinine: 3.2 mg/dL — ABNORMAL HIGH (ref 0.7–1.3)
Glucose: 284 mg/dL — ABNORMAL HIGH (ref 70–100)
Potassium: 4 mEq/L (ref 3.5–5.1)
Sodium: 148 mEq/L — ABNORMAL HIGH (ref 136–145)

## 2013-04-28 LAB — GFR: EGFR: 20.8

## 2013-04-28 LAB — PHOSPHORUS: Phosphorus: 3.7 mg/dL (ref 2.3–4.7)

## 2013-04-28 LAB — MAGNESIUM: Magnesium: 2.5 mg/dL (ref 1.6–2.6)

## 2013-04-28 MED ORDER — NYSTOP 100000 UNIT/GM EX POWD
1.0000 | Freq: Two times a day (BID) | CUTANEOUS | Status: DC
Start: 2013-04-28 — End: 2013-05-21
  Administered 2013-04-28 – 2013-05-20 (×45): 1 via TOPICAL
  Filled 2013-04-28 (×2): qty 15

## 2013-04-28 MED ORDER — INSULIN GLARGINE 100 UNIT/ML SC SOLN
7.0000 [IU] | Freq: Every day | SUBCUTANEOUS | Status: DC
Start: 2013-04-28 — End: 2013-04-29
  Administered 2013-04-28 – 2013-04-29 (×2): 7 [IU] via SUBCUTANEOUS
  Filled 2013-04-28 (×2): qty 70

## 2013-04-28 MED ORDER — BUMETANIDE 0.25 MG/ML IJ SOLN
1.0000 mg | Freq: Two times a day (BID) | INTRAMUSCULAR | Status: DC
Start: 2013-04-28 — End: 2013-04-29
  Administered 2013-04-28 – 2013-04-29 (×2): 1 mg via INTRAVENOUS
  Filled 2013-04-28 (×2): qty 10

## 2013-04-28 NOTE — Plan of Care (Signed)
Pt cont to be vented and sedated. RASS of -4 to -5 maintained. Pt cont to have RR >28 and breathing over vent using accessory muscles with increased dose of sedation. ICU attending evaluated pt and sedation medication adjusted with positive effect. Pt has no gag reflex. Pt has cough with ETT inline suctioning and with turn and reposition. ETT and oral cavity cont to have small amount yellow/tan secretion. Neo drip weaned to off. Pt maintaining SBP > 90 on his own. Peripheral edema increasing. TF restarted and pt tolerating it wnl. Free water flushed given per order. BM X 2 liquid XL brown.  Pt has stage II on sacral area. To protect skin rectal tube inserted. Rectal tube cont to drain liquid stool. Diuretics one dose given with positive effect in urine output. Complete bath given. During bath noticed LLE foot area has vesicle. Marked with marker. At this time it is intact and reddened. Oral care and ETT done per unit protocols. Cont with hourly rounding and monitoring and report changes to attending.

## 2013-04-28 NOTE — Progress Notes (Addendum)
[facility]- Critical Care Note     ICU Daily Progress Note        Date Time: 04/28/2013 12:01 PM  Patient Name: Adam Simmons  Attending Physician: Lawernce Ion, MD  Room: IC05/IC05-A   Admit Date: 04/24/2013  LOS: 4 days        Assessment/Plan:   # Neurologic: Patient needs to be sedated in order to allow mechanical   ventilatory support.    # Respiratory: Respiratory failure from what appears to be multilobar   pneumonia with hypoxia on ventilator, concerning for ARDS at this time.   Continue lung protective strategy.  Improved hypoxemia.  Decrease ventilatory settings as patient tolerates.cont steroids.     # Nutrition: appreciate nutritionist recommendation, cont tube feeds    # Endocrine: Diabetes; uncontrolled, cont high dose sliding-scale insulin and increase lantus 7 units in am, cont lantus 25 units in pm.     # Cardiac: septic shock: cont Neo-Synephrine. The patient has been aggressively fluid resuscitated. Echo indicated EF 50%    # Hematologic: Thrombocytopenia most likely from underlying sepsis     #renal: acute kidney injury, appreciate Dr. Cleophus Molt recs. Dialysis planned for tomorrow morning. No emergent need for HD today.     # Infectious diseases: streptococcus pneumonia bacteremia/ pneumonia.  Appreciate Dr. Myrtis Ser consultation and evaluation. Continue antibiotics    Code Status: full code     I have personally reviewed the patient's history and 24 hour interval events, along with vitals, labs, radiology images, and nurses report.         Subjective:     48 y.o. male h/o DM type 2, HTN who presents to the hospital with respiratory failure, from streptococcus pneumonia, ARDS, AKI and septic shock.        Patient able to come down on oxygenation. Now off pressor    Hospital Course  2/3   ARDS / PNA / Sepsis  w/ resp failure- intubated,  Flu test-neg  Resp c+s= gm + cocci , x2BC= gm + cocci resembling strep  2/6 Quinton placed for worsening creatinine, 14 L positive    Medications:    Scheduled Meds:  Current Facility-Administered Medications   Medication Dose Route Frequency   . albuterol-ipratropium  3 mL Nebulization Q4H SCH   . [COMPLETED] bumetanide  1 mg Intravenous Once   . bumetanide  1 mg Intravenous 2XDAY at 0800 & 1200   . chlorhexidine  15 mL Mouth/Throat BID   . docusate  100 mg Oral BID   . enoxaparin  40 mg Subcutaneous Daily   . ertapenem  500 mg Intravenous Q24H SCH   . famotidine  20 mg per OG tube BID   . insulin aspart  1-12 Units Subcutaneous Q4H   . insulin glargine  25 Units Subcutaneous QHS   . lactobacillus/streoptococcus  1 capsule Oral Daily   . levofloxacin  750 mg Intravenous Q48H   . methylprednisolone  20 mg Intravenous Q12H SCH   . polyethylene glycol  17 g Oral BID   . thiamine  100 mg Oral Daily   . [DISCONTINUED] ertapenem  1,000 mg Intravenous Q24H SCH   . [DISCONTINUED] insulin glargine  15 Units Subcutaneous QHS         Continuous Infusions:       . midazolam 4 mg/hr (04/28/13 0954)   . phenylephrine Stopped (04/28/13 0100)   . propofol 45 mcg/kg/min (04/28/13 1116)   . [DISCONTINUED] fentaNYL Stopped (04/27/13 2200)   . [DISCONTINUED] midazolam (  VERSED) infusion 16 mg/hr (04/27/13 2000)   . [DISCONTINUED] propofol Stopped (04/26/13 1258)          Physical Exam:     Filed Vitals:    04/28/13 1123   BP:    Pulse:    Temp:    Resp:    SpO2: 94%         Intake/Output Summary (Last 24 hours) at 04/28/13 1201  Last data filed at 04/28/13 1011   Gross per 24 hour   Intake 2416.35 ml   Output   4050 ml   Net -1633.65 ml     General Appearance: obese, sedated, intubated   Mental status: Sedated but opens eyes easily   Neuro: Sedated, intubated   Neck: supple   Lungs: bilateral rhonchi anteriorly, expiratory wheezing   Cardiac: ns1, ns2, no m/r/g, tachycardic   Abdomen: Obese, soft, nontender, hypoactive bowel sounds   Extremities: trace lower extremity edema   Skin: Has band like abrasion around bilateral ankles, has a triangular mark on right forearm area that is  scabbed over, has abrasion on left forearm, has grease on his hands, has abrasions on his sacral area, has various bruising on his upper extremities.  Anasarca  Fluid filled blister on left lower foot with no erythema.       Data:       Invasive ICU Hemodynamics:    Invasive Hemodynamic Monitoring  CVP (mmHg): 22 mmHg    Art Line  Arterial Line BP: 110/53 mmHg  Arterial Line MAP (mmHg): 70 mmHg  Arterial Line Location: Left radial  Art Line Wave Form: Appropriate wave forms;Rezeroed      Vent Settings:    Vent Settings  Vent Mode: PRVC  FiO2: 50 %  Resp Rate (Set): 28   Vt (Set, mL): 380 mL  PIP Observed (cm H2O): 31 cm H2O  PEEP/EPAP: 10 cm H20  Pressure Support / IPAP: 10 cmH20  Mean Airway Pressure: 15 cmH20        Labs:         Labs (last 72 hours):  Recent Labs   Basename 04/28/13 0336 04/27/13 0408 04/26/13 0304    WBC 15.56* 19.16* 14.13*    HGB 10.7* 11.1* 11.5*    HCT 34.3* 36.0* 36.7*    LABPLAT -- -- --     No results found for this basename: PT:3,INR:3,PTT:3 in the last 72 hours Recent Labs   Basename 04/28/13 0334 04/27/13 0408 04/26/13 0304    NA 148* 148* 152*    K 4.0 4.2 4.0    CL 109* 111* 112*    CO2 25 24 25     BUN 80.2* 59.2* 39.3*    CREAT 3.2* 2.9* 2.2*    GLU 284* 237* 346*    CA 7.8* 7.5* 7.2*    MG 2.5 2.5 2.0    PHOS 3.7 3.6 3.0                     Rads:   Radiological Imaging personally reviewed,and agree with radiology report including:       CXR  04/26/2013    mildy improved multilobar pneumonia      I have personally reviewed the patient's history and 24 hour interval events, along with vitals, labs, radiology images and nursing.         Signed by: Lawernce Ion, MD  Date/Time: 04/28/2013 12:01 PM

## 2013-04-28 NOTE — Progress Notes (Signed)
[facility] [employer].  Progress Note    Assessment:  1.AKI- non-oliguric - heading toward needing renal replacement therapy  2. Pneumonia with ARDS- on ventilator  3. Septic shock on pressors  4. Severe acidosis - combined respiratory and metabolic  5. DM on Metformin  6. HTN  7. Kidney stones  8. Exposure to NSAIDs prior to admission  9. Hypernatremia    Plan:  1.Ventilator support  2. Hemodynamic support - off pressors  3. IV fluids stopped - now getting Bumex  4. Daily labs  5. Adjust meds based on GFR < 30  6. Requested a Quinton catheter 04/27/13.  7. PEG feeding with water flushes  8. Dialysis in am - d/w wife    Herbie Drape, MD  Office - [phone]  ++++++++++++++++++++++++++++++++++++++++++++++++++++++++++++++  Subjective:  Intubated on vent    Medications:  Scheduled Meds:  Current Facility-Administered Medications   Medication Dose Route Frequency   . albuterol-ipratropium  3 mL Nebulization Q4H SCH   . [COMPLETED] bumetanide  1 mg Intravenous Once   . chlorhexidine  15 mL Mouth/Throat BID   . docusate  100 mg Oral BID   . enoxaparin  40 mg Subcutaneous Daily   . ertapenem  500 mg Intravenous Q24H SCH   . famotidine  20 mg per OG tube BID   . insulin aspart  1-12 Units Subcutaneous Q4H   . insulin glargine  25 Units Subcutaneous QHS   . lactobacillus/streoptococcus  1 capsule Oral Daily   . levofloxacin  750 mg Intravenous Q48H   . methylprednisolone  20 mg Intravenous Q12H SCH   . polyethylene glycol  17 g Oral BID   . thiamine  100 mg Oral Daily   . [DISCONTINUED] ertapenem  1,000 mg Intravenous Q24H SCH   . [DISCONTINUED] insulin glargine  15 Units Subcutaneous QHS     Continuous Infusions:       . midazolam 4 mg/hr (04/28/13 0600)   . phenylephrine Stopped (04/28/13 0100)   . propofol 45 mcg/kg/min (04/28/13 0824)   . [DISCONTINUED] fentaNYL Stopped (04/27/13 2200)   . [DISCONTINUED] midazolam (VERSED) infusion 16 mg/hr (04/27/13 2000)   . [DISCONTINUED] propofol Stopped  (04/26/13 1258)     PRN Meds:acetaminophen, albuterol, dextrose, dextrose, fentaNYL, glucagon (rDNA), midazolam, vecuronium, [DISCONTINUED] LORazepam, [DISCONTINUED] vancomycin    Objective:  Vital signs in last 24 hours:  Heart Rate:  [90-107] 99   BP: (92-126)/(54-69) 92/54 mmHg  Arterial Line BP: (92-130)/(50-74) 111/60 mmHg  FiO2:  [50 %] 50 %    Intake/Output from yesterday (07:01 - 07:00):  02/06 0701 - 02/07 0700  In: 2072.05 [I.V.:962.05]  Out: 4000 [Urine:3500]     Physical Exam:   Gen: WD WN NAD   CV: S1 S2 N RRR   Chest: CTAB   Ab: ND NT soft no HSM +BS   Ext: No C/E    Labs:    Lab 04/28/13 0334 04/27/13 0408 04/26/13 0304 04/25/13 1827 04/25/13 0730 04/24/13 1103   GLU 284* 237* 346* -- -- --   BUN 80.2* 59.2* 39.3* -- -- --   CREAT 3.2* 2.9* 2.2* -- -- --   CA 7.8* 7.5* 7.2* -- -- --   NA 148* 148* 152* -- -- --   K 4.0 4.2 4.0 -- -- --   CL 109* 111* 112* -- -- --   CO2 25 24 25  -- -- --   ALB -- -- -- 2.1* 2.3* 3.4*   PHOS 3.7  3.6 3.0 -- -- --   MG 2.5 2.5 2.0 -- -- --       Lab 04/28/13 0336 04/27/13 0408 04/26/13 0304   WBC 15.56* 19.16* 14.13*   HGB 10.7* 11.1* 11.5*   HCT 34.3* 36.0* 36.7*   MCV 91.5 91.8 90.4   MCH 28.5 28.3 28.3   MCHC 31.2* 30.8* 31.3*   RDW 16* 16* 16*   MPV 11.9 11.5 11.4   PLT 137* 134* 121*

## 2013-04-28 NOTE — Progress Notes (Signed)
Infectious Disease            Progress Note    04/28/2013   Adam Simmons:[id number] is a 48 y.o. male, history of hypertension, diabetes mellitus, kidney stones. Admitted with septic shock, pneumonia, respiratory failure.    Subjective:     Adam Simmons today Symptoms:  Having diarrhea. Worsening kidney functions , leukocytosis improving. On ventilator, off pressors, blood cultures growing strep pneumoniae. Afebrile.    Objective:     Blood pressure 92/54, pulse 99, temperature 98.6 F (37 C), temperature source Temporal Artery, resp. rate 28, height 1.727 m (5\' 8" ), weight 134.7 kg (296 lb 15.4 oz), SpO2 94.00%.    General Appearance: Intubated and sedated.    HEENT: Pallor negative, Anicteric sclera.  Intubated  Lungs:  Bilateral decreased breath sounds  Heart: Normal rate.  S1 and S2.    Chest Wall: Symmetric chest wall expansion.   Abdomen: Abdomen is soft, scaphoid and non-distended. There are no signs of ascites. Bowel sounds are normal.   Neurological: Patient is intubated and sedated    Laboratory And Diagnostic Studies:     Recent Labs   [facility] 04/28/13 0336 04/27/13 0408    WBC 15.56* 19.16*    HGB 10.7* 11.1*    HCT 34.3* 36.0*    PLT 137* 134*    NEUTRO -- --     Recent Labs   [facility] 04/28/13 0334 04/27/13 0408    NA 148* 148*    K 4.0 4.2    CL 109* 111*    CO2 25 24    BUN 80.2* 59.2*    CREAT 3.2* 2.9*    GLU 284* 237*    CA 7.8* 7.5*     Recent Labs   [facility] 04/25/13 1827    AST 114*    ALT 125*    ALKPHOS 48    PROT 5.1*    ALB 2.1*    BILITOTAL 1.4*     Blood cultures:  Strep pneumoniae      Current Med's:     Current Facility-Administered Medications   Medication Dose Route Frequency   . albuterol-ipratropium  3 mL Nebulization Q4H SCH   . [COMPLETED] bumetanide  1 mg Intravenous Once   . chlorhexidine  15 mL Mouth/Throat BID   . docusate  100 mg Oral BID   . enoxaparin  40 mg Subcutaneous Daily   . ertapenem  500 mg Intravenous Q24H SCH   . famotidine  20 mg per  OG tube BID   . insulin aspart  1-12 Units Subcutaneous Q4H   . insulin glargine  25 Units Subcutaneous QHS   . lactobacillus/streoptococcus  1 capsule Oral Daily   . levofloxacin  750 mg Intravenous Q48H   . methylprednisolone  20 mg Intravenous Q12H SCH   . polyethylene glycol  17 g Oral BID   . thiamine  100 mg Oral Daily   . [DISCONTINUED] ertapenem  1,000 mg Intravenous Q24H SCH   . [DISCONTINUED] insulin glargine  15 Units Subcutaneous QHS   . [DISCONTINUED] vancomycin  1,500 mg Intravenous Once       Line, Drains, Airways:     NG/OG Tube Orogastric 16 Fr. Center mouth (Active)   Number of days:4       Rectal Tube With balloon (Active)   Number of days:0       Urethral Catheter Non-latex 16 Fr. (Active)   Number of days:4         Arterial Line  04/24/13 Left Radial (Active)   Number of days:4       CVC Triple Lumen 04/24/13 Left Internal jugular (Active)   Number of days:4       Permacath/Temporary Catheter 04/27/13 Temporary Catheter Right (Active)   Number of days:1     Assessment:      Condition.  Guarded   Septic shock   Strep pneumoniae sepsis     Respiratory failure, status post intubation   Bilateral pneumonia   Possible aspiration   Diabetes mellitus   Acute kidney injury   Thrombocytopenia    Elevated liver function tests    Plan:      Continue  Invanz   Continue Levaquin   Continue ventilatory support   Continue supportive care   Correction of electrolytes   Nephrology follow up   Discussed with Dr. Roxan Hockey, M.D.,FACP  04/28/2013  8:50 AM

## 2013-04-29 LAB — HEPATITIS B SURFACE ANTIGEN W/ REFLEX TO CONFIRMATION: Hepatitis B Surface Antigen: NONREACTIVE

## 2013-04-29 LAB — CBC
Hematocrit: 36.1 % — ABNORMAL LOW (ref 42.0–52.0)
Hgb: 11.1 g/dL — ABNORMAL LOW (ref 13.0–17.0)
MCH: 28.2 pg (ref 28.0–32.0)
MCHC: 30.7 g/dL — ABNORMAL LOW (ref 32.0–36.0)
MCV: 91.9 fL (ref 80.0–100.0)
MPV: 11 fL (ref 9.4–12.3)
Nucleated RBC: 1 (ref 0–1)
Platelets: 142 10*3/uL (ref 140–400)
RBC: 3.93 10*6/uL — ABNORMAL LOW (ref 4.70–6.00)
RDW: 16 % — ABNORMAL HIGH (ref 12–15)
WBC: 16.5 10*3/uL — ABNORMAL HIGH (ref 3.50–10.80)

## 2013-04-29 LAB — HEMOLYSIS INDEX: Hemolysis Index: 5 (ref 0–18)

## 2013-04-29 LAB — GLUCOSE WHOLE BLOOD - POCT
Whole Blood Glucose POCT: 182 mg/dL — ABNORMAL HIGH (ref 70–100)
Whole Blood Glucose POCT: 206 mg/dL — ABNORMAL HIGH (ref 70–100)
Whole Blood Glucose POCT: 220 mg/dL — ABNORMAL HIGH (ref 70–100)
Whole Blood Glucose POCT: 222 mg/dL — ABNORMAL HIGH (ref 70–100)
Whole Blood Glucose POCT: 225 mg/dL — ABNORMAL HIGH (ref 70–100)

## 2013-04-29 LAB — BASIC METABOLIC PANEL
Anion Gap: 13 (ref 5.0–15.0)
BUN: 90 mg/dL — ABNORMAL HIGH (ref 9.0–21.0)
CO2: 28 mEq/L (ref 22–29)
Calcium: 8.7 mg/dL (ref 8.5–10.5)
Chloride: 110 mEq/L — ABNORMAL HIGH (ref 98–107)
Creatinine: 2.9 mg/dL — ABNORMAL HIGH (ref 0.7–1.3)
Glucose: 263 mg/dL — ABNORMAL HIGH (ref 70–100)
Potassium: 3.9 mEq/L (ref 3.5–5.1)
Sodium: 151 mEq/L — ABNORMAL HIGH (ref 136–145)

## 2013-04-29 LAB — GFR: EGFR: 23.3

## 2013-04-29 LAB — MAGNESIUM: Magnesium: 2.3 mg/dL (ref 1.6–2.6)

## 2013-04-29 LAB — PHOSPHORUS: Phosphorus: 4.7 mg/dL (ref 2.3–4.7)

## 2013-04-29 LAB — HEPATITIS B SURFACE ANTIBODY: HEPATITIS B SURFACE ANTIBODY: <8

## 2013-04-29 MED ORDER — BUMETANIDE 0.25 MG/ML IJ SOLN
0.5000 mg | Freq: Two times a day (BID) | INTRAMUSCULAR | Status: DC
Start: 2013-04-29 — End: 2013-04-30
  Administered 2013-04-29 – 2013-04-30 (×2): 0.5 mg via INTRAVENOUS
  Filled 2013-04-29 (×2): qty 10

## 2013-04-29 MED ORDER — RISAQUAD PO CAPS
1.0000 | ORAL_CAPSULE | Freq: Every day | ORAL | Status: DC
Start: 2013-04-29 — End: 2013-04-29

## 2013-04-29 MED ORDER — INSULIN GLARGINE 100 UNIT/ML SC SOLN
30.0000 [IU] | Freq: Every evening | SUBCUTANEOUS | Status: DC
Start: 2013-04-29 — End: 2013-05-03
  Administered 2013-04-29 – 2013-05-02 (×4): 30 [IU] via SUBCUTANEOUS
  Filled 2013-04-29 (×4): qty 300

## 2013-04-29 MED ORDER — MORPHINE SULFATE 2 MG/ML IJ/IV SOLN (WRAP)
2.0000 mg | Status: DC | PRN
Start: 2013-04-29 — End: 2013-05-23
  Administered 2013-04-29 – 2013-05-18 (×15): 2 mg via INTRAVENOUS
  Filled 2013-04-29 (×15): qty 1

## 2013-04-29 MED ORDER — ZINC OXIDE 40 % EX PSTE
PASTE | CUTANEOUS | Status: DC | PRN
Start: 2013-04-29 — End: 2013-05-23
  Administered 2013-04-30 – 2013-05-21 (×3): 1 via TOPICAL
  Filled 2013-04-29 (×9): qty 56

## 2013-04-29 MED ORDER — MIDAZOLAM INFUSION 1MG/ML--OUTSOURCED
1.0000 mg/h | INTRAVENOUS | Status: DC
Start: 2013-04-29 — End: 2013-05-10
  Administered 2013-04-30: 3 mg/h via INTRAVENOUS
  Administered 2013-05-02 – 2013-05-06 (×9): 8 mg/h via INTRAVENOUS
  Administered 2013-05-07: 6 mg/h via INTRAVENOUS
  Administered 2013-05-08 (×2): 5 mg/h via INTRAVENOUS
  Filled 2013-04-29 (×10): qty 100

## 2013-04-29 MED ORDER — SODIUM CHLORIDE 0.45 % IV SOLN
INTRAVENOUS | Status: DC
Start: 2013-04-29 — End: 2013-05-02
  Administered 2013-04-29 – 2013-04-30 (×2): 75 mL/h via INTRAVENOUS

## 2013-04-29 NOTE — Plan of Care (Signed)
Problem: Potential for Compromised Skin Integrity  Goal: Skin integrity is maintained or improved  Assess and monitor skin integrity. Identify patients at risk for skin breakdown on admission and per policy. Collaborate with interdisciplinary team and initiate plans and interventions as needed.   Outcome: Progressing  Intervention: Monitor/assess Braden Scale every shift  braden scale done with interventions.   Intervention: Turn patient  Every two hours-pt unable to remain turned due to increase respiratory effort.   Intervention: Relieve pressure to bony prominences  Pillow support      Problem: Tissue integrity  Goal: Nutritional Status Improving  Outcome: Progressing  Pt on TFs and tolerating  Intervention: Encourage dietary supplements as ordered.  prosource given.   Intervention: Monitor daily weight.  Done in am.       Comments:   Continue to monitor.

## 2013-04-29 NOTE — Progress Notes (Signed)
Daily Progress Note    Date Time: 04/29/2013 5:36 PM  Patient Name: [facility]  Attending Physician: Lawernce Ion, MD  Room: IC05/IC05-A   Admit Date: 04/24/2013  LOS: 5 days        Assessment/Plan:   #Neuro: sedated on propofol and versed drips. RASS changed from -5 to -3, Weaned sedation. Morphine given for increased RR- per dr Lesle Reek.    #Cardio: ST. VSS. Afebrile.    #Resp: Vent settings changed. See setting below. No sputum. Increased rr-dr mendiguren at bedside and aware. Pt does not tolerate turning for a long period- increase RR and desaturation.     #GI: rounded, distention- bowel regimen-on hold due to diarrhea.          #Renal /Fluid, Electrolytes : foley. bumex given with increased u/o. First dialysis done-pt stable.     #Endo: accuchecks-coverage given.    #Skin: Desitin applied to peri area.      #Nutrition: TFs. Minimal residuals. Increased free water flushes to 300cc every 4 hours. prosource given.    #Lines: 2PIV, CVC, Quinton     #Prophylaxis:   GI Prophylaxis:  pepcid  VTE Prophylaxis: lovenox and SCDs.    Wife and mother at bedside and updated.        Medications:   Scheduled Meds:  Current Facility-Administered Medications   Medication Dose Route Frequency   . albuterol-ipratropium  3 mL Nebulization Q4H SCH   . bumetanide  0.5 mg Intravenous 2XDAY at 0800 & 1200   . chlorhexidine  15 mL Mouth/Throat BID   . docusate  100 mg Oral BID   . enoxaparin  40 mg Subcutaneous Daily   . ertapenem  500 mg Intravenous Q24H SCH   . famotidine  20 mg per OG tube BID   . insulin aspart  1-12 Units Subcutaneous Q4H   . insulin glargine  30 Units Subcutaneous QHS   . lactobacillus/streoptococcus  1 capsule Oral Daily   . levofloxacin  750 mg Intravenous Q48H   . methylprednisolone  20 mg Intravenous Q12H SCH   . nystatin  1 application Topical BID   . polyethylene glycol  17 g Oral BID   . thiamine  100 mg Oral Daily   . [DISCONTINUED] bumetanide  1 mg Intravenous 2XDAY at 0800 & 1200   . [DISCONTINUED]  insulin glargine  25 Units Subcutaneous QHS   . [DISCONTINUED] insulin glargine  7 Units Subcutaneous Daily   . [DISCONTINUED] lactobacillus/streoptococcus  1 capsule Oral Daily         Continuous Infusions:       . sodium chloride 75 mL/hr (04/29/13 1027)   . midazolam 3 mg/hr (04/29/13 1652)   . phenylephrine Stopped (04/28/13 0100)   . propofol 30 mcg/kg/min (04/29/13 1653)   . [DISCONTINUED] midazolam 6 mg/hr (04/29/13 1223)        Data:    Vent Settings:    Vent Settings  Vent Mode: PRVC  FiO2: 50 %  Resp Rate (Set): 20   Vt (Set, mL): 400 mL  PIP Observed (cm H2O): 26 cm H2O  PEEP/EPAP: 8 cm H20  Pressure Support / IPAP: 10 cmH20  Mean Airway Pressure: 13 cmH20        Labs:   Labs (last 72 hours):  Recent Labs   Basename 04/29/13 0323 04/28/13 0336 04/27/13 0408    WBC 16.50* 15.56* 19.16*    HGB 11.1* 10.7* 11.1*    HCT 36.1* 34.3* 36.0*    LABPLAT -- -- --  No results found for this basename: PT:3,INR:3,PTT:3 in the last 72 hours Recent Labs   [facility] 04/29/13 0323 04/28/13 0334 04/27/13 0408    NA 151* 148* 148*    K 3.9 4.0 4.2    CL 110* 109* 111*    CO2 28 25 24     BUN 90.0* 80.2* 59.2*    CREAT 2.9* 3.2* 2.9*    GLU 263* 284* 237*    CA 8.7 7.8* 7.5*    MG 2.3 2.5 2.5    PHOS 4.7 3.7 3.6

## 2013-04-29 NOTE — Progress Notes (Signed)
Dialysis Treatment Note    Removed 1L net fluid    Patient:  Adam Simmons MRN#:  [id number]  Unit/Room/Bed:  IC05/IC05-A   Isolation: None       Allergies: No Known Allergies  Code Status: Full Code    Problem List:   Patient Active Problem List    Diagnosis Date Noted   . DM (diabetes mellitus) 04/25/2013   . AKI (acute kidney injury) 04/25/2013   . Respiratory failure 04/24/2013   . Pneumonia 04/24/2013   . Hypokalemia 04/24/2013   . ARDS (adult respiratory distress syndrome) 04/24/2013       Dialysis Order:  Active Orders   Dialysis    Hemodialysis inpatient     Frequency: Once     Start Date/Time: 04/29/13 0949     Number of Occurrences:  1 Occurrences     Order Questions:     . K+ (Initial) 3 mEq     . KPP (Per Protocol) Yes     . Ca++ 2.5 mEq     . Bicarb 35 mEq     . Na+ 138 mEq     . Dialyzer F160     . Dialysate Temperature (C) 37     . BFR-As tolerated to a maximum of: 200 mL/min     . DFR 500 mL/min     . Duration of Treatment 1.5 Hours     . Fluid Removal (L) and Dry Weight (Kg) 1L        Dialysis Treatment Type:    Time out/Safety Check: Time Out/Safety Check Completed: Yes (04/29/13 5409)  [facility] Consent:    Blood Consent (if applicable): Blood Consent Verified: Not Applicable (04/29/13 8119)    Dialysis Access:  Permacath/Temporary Catheter 04/27/13 Temporary Catheter Right (Active)   Line necessity reviewed? Yes 04/29/2013 10:00 AM   Dressing Status and Intervention New Dressing;Antibacterial Disc Applied;Clean & Dry 04/29/2013  9:57 AM   Tego Caps on Catheter Yes 04/29/2013  9:57 AM   NEW Tego Caps placed (Date) 04/29/13 04/29/2013  9:57 AM   End Caps Free From Blood Yes 04/29/2013 10:00 AM   Line Care Connections checked and tightened 04/29/2013  9:57 AM   Dressing Type Transparent 04/29/2013 10:00 AM   Dressing Status Clean;Dry;Intact 04/29/2013 10:00 AM   Dressing Intervention Dressing changed 04/29/2013  9:57 AM   Biopatch Used? Yes 04/29/2013 10:00 AM   Line Used For Blood Draw No 04/29/2013 10:00 AM    Dressing Change Due 05/05/13 04/29/2013 10:00 AM           General Assessments:     04/29/13 1478   Bedside Nurse Communication   Name of bedside RN - pre dialysis Gurney Maxin, RN   Treatment Initiation- With Dialysis Precautions   Time Out/Safety Check Completed Yes   Blood Consent Verified N/A   Dialysis Precautions All Connections Secured;Saline Line Double Clamped;Venous Parameters Set;Arterial Parameters Set;Air Foam Detecctor Engaged   Dialysis Treatment Type 1st Time Acute;ICU/CCU   Is patient diabetic? Yes   RO/Hemodialysis Cabin crew   Is Total Chlorine less than 0.1 ppm? Yes   Orignial Total Chlorine Testing Time 0855   RO/Hemodialysis Dance movement psychotherapist Number 2    RO # 3    Water Hardness 0    pH 7.6    Pressure Test Verified Yes   Alarms Verified Passed   Machine Temperature 98.6 F (37 C)   Alarms Verified Yes   Hemodialysis Conductivity (Machine) 13.8  Hemodialysis Conductivity (Meter) 13.4    Dialyzer Lot Number ([id number] exp 11/17)   Tubing Lot Number ([id number] exp 11/17)   RO Machine Log Completed Yes   Hepatitis Status   HBsAg (Antigen) Result Unknown   Dialysis Weight   Pre-Treatment Weight (Kg) 135.6    Scale Type ICU Bed Scale   Vitals   Temp 97.5 F (36.4 C)   Heart Rate ! 115    Resp Rate ! 28    BP 146/69 mmHg   SpO2 93 %   FiO2 50 %   Assessment   Mental Status Non-Responsive  (intubated)   Cardiac (WDL) X   Cardiac Regularity Regular   Cardiac Symptoms None   Cardiac Rhythm Sinus Tach   Respiratory  X   Respiratory Pattern Regular  (intubated)   Edema  X   Generalized Edema Non Pitting Edema   RLE Edema +2   LLE Edema +2   General Skin Color Pale   Skin Condition/Temp Warm;Dry   Gastrointestinal (WDL) X   Abdomen Inspection Distended   Mobility Bed   Permacath/Temporary Catheter 04/27/13 Temporary Catheter Right   Placement Date/Time: 04/27/13 1357   Inserted by: Dr. Julien Nordmann MD  Access Type: Temporary Catheter  Orientation: Right  Access Location: (c)  Other (Comment)  Catheter Tip Location: SVC  Central Line Infection Prevention Education provided?: No  Hand Hyg   Dressing Status and Intervention Clean & Dry;New Dressing;Antibacterial Disc Applied   Pain Assessment   Charting Type Assessment   Pain Scale Used CPOT   Hemodialysis Comments   Pre-Hemodialysis Comments First HD tx. Consent obtained (signed by spouse). Report from primary RN. Pt ID with wristband.  pre-tx assessment completed. labs reviewed with order. Pt intubated.       04/29/13 1140   Treatment Summary   Time Off Machine 1129   Duration of Treatment (Hours) 1.5   Dialyzer Clearance Lightly streaked   Fluid Volume Off (mL) 1400    Prime Volume (mL) 200    Rinseback Volume (mL) 200    Fluid Given: Normal Saline (mL) 0    Fluid Given: PRBC  0 mL   Fluid Given: Albumin (mL) 0    Fluid Given: Other (mL) 0    Total Fluid Given 400    Hemodialysis Net Fluid Removed 1000    Post Treatment Assessment   Post-Treatment Weight (Kg) 134.6    Patient Response to Treatment pt tolerated tx well   Additional Dialyzer Used no   Vitals   Temp 98.5 F (36.9 C)   Heart Rate ! 114    Resp Rate ! 26    BP 144/71 mmHg   SpO2 93 %   FiO2 50 %   Assessment   Mental Status Non-Responsive  (intubated)   Cardiac (WDL) X   Cardiac Regularity Regular   Cardiac Symptoms None   Cardiac Rhythm Sinus Tach   Respiratory  X  (vent)   Respiratory Pattern Regular   Cough No cough effort   Edema  X   Generalized Edema Non Pitting Edema   RLE Edema +2   LLE Edema +2   General Skin Color Pale   Skin Condition/Temp Warm;Dry   Gastrointestinal (WDL) X   Abdomen Inspection Distended   Mobility Bed   Education   Person taught Other (Comments)  (spouse, pt sedated)   Knowledge basis None   Topics taught Procedure   Teaching Tools Explain   Bedside Nurse Communication   Name of  bedside RN - post dialysis Gurney Maxin, RN             Pain Assessment: Pain Assessment  Charting Type: Assessment (04/29/13 0922)  Pain Scale Used: CPOT (04/29/13  0922)    Labs Values:    HBsAg Result:HBsAg (Antigen) Result: Unknown (04/29/13 0922)  HBsAg Date Drawn:    HBsAg Next Due Date:     Lab Results   Component Value Date    BUN 90.0* 04/29/2013    NA 151* 04/29/2013    K 3.9 04/29/2013    CREAT 2.9* 04/29/2013    CO2 28 04/29/2013    CA 8.7 04/29/2013    PHOS 4.7 04/29/2013    GLU 263* 04/29/2013    ALB 2.1* 04/25/2013    HGB 11.1* 04/29/2013    HCT 36.1* 04/29/2013    WBC 16.50* 04/29/2013    PLT 142 04/29/2013         Current Diet Order  Diet NPO effective now     RO/Hemodialysis Cabin crew - Before Each Treatment:  RO/Hemodialysis Dance movement psychotherapist Number: 2  (04/29/13 0922)  RO #: 3  (04/29/13 1610)  Water Hardness: 0  (04/29/13 0922)  pH: 7.6  (04/29/13 0922)  Pressure Test Verified: Yes (04/29/13 0922)  Alarms Verified: Passed (04/29/13 9604)  Machine Temperature: 98.6 F (37 C) (04/29/13 5409)  Alarms Verified: Yes (04/29/13 8119)  Hemodialysis Conductivity (Machine): 13.8  (04/29/13 1478)  Hemodialysis Conductivity (Meter): 13.4  (04/29/13 0922)  RO Machine Log Completed: Yes (04/29/13 2956)    Chlorine Testing:  RO/Hemodialysis Machine Safety Checks  Is Total Chlorine less than 0.1 ppm?: Yes (04/29/13 0922)  Orignial Total Chlorine Testing Time: 0855 (04/29/13 0922)    Vitals and Weight:  Patient Vitals for the past 4 hrs:   BP Temp Pulse Resp   04/29/13 1140 144/71 mmHg 98.5 F (36.9 C) 114  26    04/29/13 1123 137/70 mmHg - 111  -   04/29/13 1109 130/72 mmHg - 112  -   04/29/13 1055 135/69 mmHg - 114  -   04/29/13 1044 137/70 mmHg - 115  -   04/29/13 1033 138/70 mmHg - 115  -   04/29/13 1022 137/68 mmHg - 115  29    04/29/13 1012 142/71 mmHg - 114  29    04/29/13 1000 - - 114  -   04/29/13 0957 142/70 mmHg - 120  28    04/29/13 0922 146/69 mmHg 97.5 F (36.4 C) 115  28    04/29/13 0900 132/71 mmHg - 114  29    04/29/13 0800 - - 115  -        Dialysis Weight  Pre-Treatment Weight (Kg): 135.6  (04/29/13 0922)  Scale Type: ICU Bed Scale (04/29/13  0922)  Post-Treatment Weight (Kg): 134.6  (04/29/13 1140)    Treatment:     04/29/13 0957 04/29/13 1000 04/29/13 1012   Vitals   Heart Rate ! 120  ! 114  ! 114    Resp Rate ! 28  --  ! 29    BP 142/70 mmHg --  142/71 mmHg   SpO2 93 % 93 % 93 %   FiO2 50 % 50 % 50 %   Machine Metrics   $Treatment Started/Capturing Charge Yes --  --    Blood Flow Rate (mL/min) 200 mL/min --  200 mL/min   Arterial Pressure (mmHg) -80 mmHg --  -90 mmHg   Venous Pressure (  mmHg) 120  --  120    Dialysate Flow Rate (mL/min) 500 mL/min --  500 mL/min   Transmembrane Pressure (mmHg) 80 mmHg --  80 mmHg   Ultrafiltration Rate (mL/Hr) 930 mL/hr --  930 mL/hr   Fluid Removal (ml) 0 ml --  --    Fluid Bolus (ml) 200 ml  (prime) --  0 ml   Dialysate K (mEq/L) 3 mEq/L --  3 mEq/L   Dialysate CA (mEq/L) 2.5 mEq/L --  2.5 mEq/L   Hemodialysis Comments   Arteriovenous Lines Secure Yes --  Yes   Comments time out completed, tx started. --  pt without apparent distress. VSS      04/29/13 1022 04/29/13 1033 04/29/13 1044   Vitals   Heart Rate ! 115  ! 115  ! 115    Resp Rate ! 29  --  --    BP 137/68 mmHg 138/70 mmHg 137/70 mmHg   SpO2 94 % 93 % 93 %   FiO2 50 % 50 % 50 %   Machine Metrics   Blood Flow Rate (mL/min) 200 mL/min 200 mL/min 200 mL/min   Arterial Pressure (mmHg) -90 mmHg -90 mmHg -90 mmHg   Venous Pressure (mmHg) 120  150  150    Dialysate Flow Rate (mL/min) 500 mL/min 500 mL/min 500 mL/min   Transmembrane Pressure (mmHg) 80 mmHg 80 mmHg 80 mmHg   Ultrafiltration Rate (mL/Hr) 930 mL/hr 930 mL/hr 930 mL/hr   Fluid Removal (ml) 378 ml 555 ml 714 ml   Fluid Bolus (ml) 0 ml 0 ml 0 ml   Dialysate K (mEq/L) 3 mEq/L 3 mEq/L 3 mEq/L   Dialysate CA (mEq/L) 2.5 mEq/L 2.5 mEq/L 2.5 mEq/L   Hemodialysis Comments   Arteriovenous Lines Secure Yes Yes Yes   Comments pt without apparent distress. VSS pt without apparent distress, VSS pt without apparent distress. VSS      04/29/13 1047 04/29/13 1055 04/29/13 1109   Vitals   Heart Rate --  ! 114  ! 112     BP --  135/69 mmHg 130/72 mmHg   SpO2 --  93 % 93 %   FiO2 --  50 % 50 %   Machine Metrics   Blood Flow Rate (mL/min) --  200 mL/min 200 mL/min   Arterial Pressure (mmHg) --  -90 mmHg -90 mmHg   Venous Pressure (mmHg) --  130  130    Dialysate Flow Rate (mL/min) --  500 mL/min 500 mL/min   Transmembrane Pressure (mmHg) --  80 mmHg 90 mmHg   Ultrafiltration Rate (mL/Hr) --  930 mL/hr 930 mL/hr   Fluid Removal (ml) --  875 ml 1090 ml   Fluid Bolus (ml) --  0 ml 0 ml   Dialysate K (mEq/L) --  3 mEq/L 3 mEq/L   Dialysate CA (mEq/L) --  2.5 mEq/L 2.5 mEq/L   Hemodialysis Comments   Arteriovenous Lines Secure --  Yes Yes   Comments --  pt without apparent distress. VSS pt without apparent distress. VSS   Intake and Output (mL)   I.V. 69.5 mL --  --       04/29/13 1123   Vitals   Heart Rate ! 111    BP 137/70 mmHg   SpO2 93 %   FiO2 50 %   Machine Metrics   Blood Flow Rate (mL/min) 200 mL/min   Arterial Pressure (mmHg) -90 mmHg   Venous Pressure (mmHg) 120    Dialysate  Flow Rate (mL/min) 500 mL/min   Transmembrane Pressure (mmHg) 90 mmHg   Ultrafiltration Rate (mL/Hr) 930 mL/hr   Fluid Removal (ml) 1312 ml   Fluid Bolus (ml) 0 ml   Dialysate K (mEq/L) 3 mEq/L   Dialysate CA (mEq/L) 2.5 mEq/L   Hemodialysis Comments   Arteriovenous Lines Secure Yes   Comments pt without apparent distress. VSS. preparing for tx end.             Intake/Output:  I/O this shift:  In: 688.3 [I.V.:388.3; NG/GT:300]  Out: 1000 [Other:1000]      Medications:  Current Facility-Administered Medications   Medication Dose Route Last Rate Last Dose        Education:  Education  Person taught: Other (Comments) (spouse, pt sedated) (04/29/13 1140)  Knowledge basis: None (04/29/13 1140)  Topics taught: Procedure (04/29/13 1140)  Teaching Tools: Explain (04/29/13 1140)    Primary Nurse Communication:  Bedside Nurse Communication  Name of bedside RN - pre dialysis: Gurney Maxin, RN (04/29/13 1610)  Name of bedside RN - post dialysis: Gurney Maxin, RN  (04/29/13 1140)      [facility] nurse signature/date/time:_Jane Enis Slipper, R.N., BSN, 04/29/2013, 1150_

## 2013-04-29 NOTE — Progress Notes (Signed)
Infectious Disease            Progress Note    04/29/2013   Adam Simmons [id number] is a 48 y.o. male, history of hypertension, diabetes mellitus, kidney stones. Admitted with septic shock, pneumonia, respiratory failure.    Subjective:     Adam Simmons today Symptoms: Improving  kidney functions , still leukocytosis but on steroids as well. On ventilator, off pressors, blood cultures growing strep pneumoniae. Afebrile.    Objective:     Blood pressure 138/72, pulse 115, temperature 98.6 F (37 C), temperature source Temporal Artery, resp. rate 29, height 1.727 m (5\' 8" ), weight 135.6 kg (298 lb 15.1 oz), SpO2 94.00%.    General Appearance: Intubated and sedated.    HEENT: Pallor negative, Anicteric sclera.  Intubated  Lungs:  Bilateral decreased breath sounds  Heart: Normal rate.  S1 and S2.    Chest Wall: Symmetric chest wall expansion.   Abdomen: Abdomen is soft, scaphoid and non-distended. There are no signs of ascites. Bowel sounds are normal.   Neurological: Patient is intubated and sedated    Laboratory And Diagnostic Studies:     Recent Labs   [facility] 04/29/13 0323 04/28/13 0336    WBC 16.50* 15.56*    HGB 11.1* 10.7*    HCT 36.1* 34.3*    PLT 142 137*    NEUTRO -- --     Recent Labs   [facility] 04/29/13 0323 04/28/13 0334    NA 151* 148*    K 3.9 4.0    CL 110* 109*    CO2 28 25    BUN 90.0* 80.2*    CREAT 2.9* 3.2*    GLU 263* 284*    CA 8.7 7.8*       Blood cultures:  Strep pneumoniae      Current Med's:     Current Facility-Administered Medications   Medication Dose Route Frequency   . albuterol-ipratropium  3 mL Nebulization Q4H SCH   . bumetanide  1 mg Intravenous 2XDAY at 0800 & 1200   . chlorhexidine  15 mL Mouth/Throat BID   . docusate  100 mg Oral BID   . enoxaparin  40 mg Subcutaneous Daily   . ertapenem  500 mg Intravenous Q24H SCH   . famotidine  20 mg per OG tube BID   . insulin aspart  1-12 Units Subcutaneous Q4H   . insulin glargine  25 Units Subcutaneous QHS   .  insulin glargine  7 Units Subcutaneous Daily   . lactobacillus/streoptococcus  1 capsule Oral Daily   . levofloxacin  750 mg Intravenous Q48H   . methylprednisolone  20 mg Intravenous Q12H SCH   . nystatin  1 application Topical BID   . polyethylene glycol  17 g Oral BID   . thiamine  100 mg Oral Daily       Line, Drains, Airways:     NG/OG Tube Orogastric 16 Fr. Center mouth (Active)   Number of days:5       Rectal Tube With balloon (Active)   Number of days:1       Urethral Catheter Non-latex 16 Fr. (Active)   Number of days:5         Arterial Line 04/24/13 Left Radial (Active)   Number of days:5       CVC Triple Lumen 04/24/13 Left Internal jugular (Active)   Number of days:5       Permacath/Temporary Catheter 04/27/13 Temporary Catheter Right (Active)   Number of days:2  Assessment:      Condition.  Guarded   Septic shock   Strep pneumoniae sepsis     Respiratory failure, status post intubation   Bilateral pneumonia   Possible aspiration   Diabetes mellitus   Acute kidney injury   Thrombocytopenia    Elevated liver function tests    Plan:      Continue  Invanz   Continue Levaquin   Continue ventilatory support   Continue supportive care   Correction of electrolytes   Nephrology follow up          Alfonzo Beers, M.D.,FACP  04/29/2013  8:30 AM

## 2013-04-29 NOTE — Progress Notes (Signed)
[facility]- Critical Care Note     ICU Daily Progress Note        Date Time: 04/29/2013 4:50 PM  Patient Name: Adam Simmons  Attending Physician: Lawernce Ion, MD  Room: IC05/IC05-A   Admit Date: 04/24/2013  LOS: 5 days            Assessment:     Patient Active Problem List   Diagnosis   . Respiratory failure   . Pneumonia   . Hypokalemia   . ARDS (adult respiratory distress syndrome)   . DM (diabetes mellitus)   . AKI (acute kidney injury)   Ventilator day 5  Underwent hemodialysis for AKI, azotemia.Tolerated well.  Hyperglycemia./DM  Pneumococcal pneumonia.  Hypernatremia    Plan:   Improved gas excchange, persistent tachypnea, fails weaning criteria.  Cont bronchodilators, abx, taper steroids  Insulin adjustment  abx per id  HD per nephrology.  Daily weaning trials. Enteral feeds.  Fluid change, .45NS    Subjective:   Sedated on vent    Medications:       Scheduled Meds: PRN Meds:         albuterol-ipratropium 3 mL Nebulization Q4H SCH   bumetanide 0.5 mg Intravenous 2XDAY at 0800 & 1200   chlorhexidine 15 mL Mouth/Throat BID   docusate 100 mg Oral BID   enoxaparin 40 mg Subcutaneous Daily   ertapenem 500 mg Intravenous Q24H SCH   famotidine 20 mg per OG tube BID   insulin aspart 1-12 Units Subcutaneous Q4H   insulin glargine 25 Units Subcutaneous QHS   insulin glargine 7 Units Subcutaneous Daily   lactobacillus/streoptococcus 1 capsule Oral Daily   levofloxacin 750 mg Intravenous Q48H   methylprednisolone 20 mg Intravenous Q12H SCH   nystatin 1 application Topical BID   polyethylene glycol 17 g Oral BID   thiamine 100 mg Oral Daily   [DISCONTINUED] bumetanide 1 mg Intravenous 2XDAY at 0800 & 1200       Continuous Infusions:       . sodium chloride 75 mL/hr (04/29/13 1027)   . midazolam     . phenylephrine Stopped (04/28/13 0100)   . propofol 50 mcg/kg/min (04/29/13 1420)   . [DISCONTINUED] midazolam 6 mg/hr (04/29/13 1223)         acetaminophen 500 mg Q4H PRN   albuterol 2.5 mg Q1H PRN   dextrose 15 g  PRN   dextrose 25 mL PRN   glucagon (rDNA) 1 mg PRN   midazolam 2 mg Q1H PRN   morphine 2 mg Q2H PRN   vecuronium 10 mg Q6H PRN   zinc Oxide  PRN   [DISCONTINUED] fentaNYL 25 mcg Q2H PRN             Physical Exam:     Filed Vitals:    04/29/13 1400 04/29/13 1500 04/29/13 1600 04/29/13 1635   BP:  118/67 121/63    Pulse: 109 103 108    Temp:       TempSrc: Foley Temp Foley Temp Foley Temp    Resp: 31 33 32    Height:       Weight:       SpO2: 94% 95% 95% 95%     Temp (24hrs), Avg:98 F (36.7 C), Min:97.5 F (36.4 C), Max:98.5 F (36.9 C)           02/07 0701 - 02/08 0700  In: 4065.99 [I.V.:1288.99]  Out: 6575 [Urine:5175]       General Appearance: on vent, obese,  tachypneic  Mental status: opens eyes to stim  Neuro:  nonfocal  H & N: no jvd  Lungs: bilat rhonchi   Cardiac: reg  Abdomen:  Soft, depressible  Extremities: trace edema  Skin: warm      Data:       Vent Settings:    Vent Settings  Vent Mode: PRVC  FiO2: 50 %  Resp Rate (Set): 20   Vt (Set, mL): 400 mL  PIP Observed (cm H2O): 26 cm H2O  PEEP/EPAP: 8 cm H20  Pressure Support / IPAP: 10 cmH20  Mean Airway Pressure: 13 cmH20      Labs:     Recent CBC   Lab 04/29/13 0323 04/28/13 0336 04/27/13 0408   WBC 16.50* 15.56* 19.16*   RBC 3.93* 3.75* 3.92*   HGB 11.1* 10.7* 11.1*   HCT 36.1* 34.3* 36.0*   MCV 91.9 91.5 91.8   PLT 142 137* 134*         Lab 04/29/13 0323 04/28/13 0334 04/27/13 0408 04/25/13 1827 04/25/13 1401 04/25/13 0730 04/25/13 0219 04/25/13 0117 04/24/13 1759 04/24/13 1103   NA 151* 148* 148* -- -- -- -- -- -- --   K 3.9 4.0 4.2 -- -- -- -- -- -- --   CL 110* 109* 111* -- -- -- -- -- -- --   CO2 28 25 24  -- -- -- -- -- -- --   GLU 263* 284* 237* -- -- -- -- -- -- --   BUN 90.0* 80.2* 59.2* -- -- -- -- -- -- --   CREAT 2.9* 3.2* 2.9* -- -- -- -- -- -- --   MG 2.3 2.5 2.5 -- -- -- -- -- -- --   PHOS 4.7 3.7 3.6 -- -- -- -- -- -- --   AST -- -- -- 114* -- 191* -- -- -- 42*   ALT -- -- -- 125* -- 126* -- -- -- 44   ALKPHOS -- -- -- 48 -- 49 -- -- --  58   BILITOTAL -- -- -- 1.4* -- 2.0* -- -- -- 1.4*   BILIDIRECT -- -- -- -- -- -- -- -- -- --   LIP -- -- -- -- -- -- -- -- -- --   BNP -- -- -- -- -- -- -- -- -- 494.6*   PT -- -- -- -- -- -- -- -- -- --   INR -- -- -- -- -- -- -- -- -- --   PTT -- -- -- -- -- -- -- -- -- --   DDIMER -- -- -- -- -- -- -- -- -- 1.35*   CK -- -- -- -- 283* -- 73 -- 101 --   CKMB -- -- -- -- 3.1 -- -- -- -- --   TROPI -- -- -- -- 0.12* -- -- 0.01 0.02 --   MYOGLOBIN -- -- -- -- -- -- -- -- -- --           Rads:     Radiology Results (24 Hour)     ** No Results found for the last 24 hours. **          Radiological Imaging personally reviewed.    I have personally reviewed the patient's history and 24 hour interval events, along with vitals, labs, radiology images and  ventilator settings and additional findings found in detail within ICU team notes, with their care plans developed with and reviewed by me.  Signed by: Durward Fortes, MD  Date/Time: 04/29/2013 4:50 PM

## 2013-04-29 NOTE — Plan of Care (Signed)
Pt cont to be vented and sedated with propofol and versed. RASS of -4 to -5 maintained. Pt cont to be off vasopressors and maintaining map > 60 on his own. Pt cont to breath over set vent RR. Breathing pattern improving after change in sedation. Small amount of secretion from ETT and oral cavity. Pt tolerating TF wnl. Free water flushes given 200 cc q 4 hours as ordered. Min amount of stool cont to ooz around rectal tube. Foely draining amber/green urine (pt on propofol gtt). Cont with q4 hours and as needed oral care and ETT care. Cont with hourly rounding and monitoring and report changes to attending.

## 2013-04-29 NOTE — Progress Notes (Signed)
[facility], [employer].  Progress Note    Assessment:  1.AKI- non-oliguric   2. Pneumonia with ARDS- on ventilator  3. Septic shock on pressors  4. Severe acidosis - combined respiratory and metabolic  5. DM on Metformin  6. HTN  7. Kidney stones  8. Exposure to NSAIDs prior to admission  9. Hypernatremia    Plan:  1.Ventilator support  2. Hemodynamic support - off pressors  3. IV fluids - 1/2 Saline  4. Daily labs  5. Adjust meds based on GFR < 30  6. Requested a Quinton catheter 04/27/13.  7. PEG feeding with water flushes  8. Dialysis 2/8    Herbie Drape, MD  Office - [phone]  ++++++++++++++++++++++++++++++++++++++++++++++++++++++++++++++  Subjective:  Intubated on vent    Medications:  Scheduled Meds:  Current Facility-Administered Medications   Medication Dose Route Frequency   . albuterol-ipratropium  3 mL Nebulization Q4H SCH   . bumetanide  1 mg Intravenous 2XDAY at 0800 & 1200   . chlorhexidine  15 mL Mouth/Throat BID   . docusate  100 mg Oral BID   . enoxaparin  40 mg Subcutaneous Daily   . ertapenem  500 mg Intravenous Q24H SCH   . famotidine  20 mg per OG tube BID   . insulin aspart  1-12 Units Subcutaneous Q4H   . insulin glargine  25 Units Subcutaneous QHS   . insulin glargine  7 Units Subcutaneous Daily   . lactobacillus/streoptococcus  1 capsule Oral Daily   . levofloxacin  750 mg Intravenous Q48H   . methylprednisolone  20 mg Intravenous Q12H SCH   . nystatin  1 application Topical BID   . polyethylene glycol  17 g Oral BID   . thiamine  100 mg Oral Daily     Continuous Infusions:       . sodium chloride     . midazolam 4 mg/hr (04/29/13 0519)   . phenylephrine Stopped (04/28/13 0100)   . propofol 35 mcg/kg/min (04/29/13 0837)     PRN Meds:acetaminophen, albuterol, dextrose, dextrose, fentaNYL, glucagon (rDNA), midazolam, vecuronium    Objective:  Vital signs in last 24 hours:  Temp:  [97.5 F (36.4 C)] 97.5 F (36.4 C)  Heart Rate:  [85-115] 115   Resp Rate:  [28-31]  28   BP: (110-146)/(57-72) 146/69 mmHg  Arterial Line BP: (110-161)/(53-74) 144/69 mmHg  FiO2:  [50 %] 50 %    Intake/Output from yesterday (07:01 - 07:00):  02/07 0701 - 02/08 0700  In: 4065.99 [I.V.:1288.99]  Out: 6575 [Urine:5175]     Physical Exam:   Gen: WD WN NAD   CV: S1 S2 N RRR   Chest: CTAB   Ab: ND NT soft no HSM +BS   Ext: No C/E    Labs:    Lab 04/29/13 0323 04/28/13 0334 04/27/13 0408 04/25/13 1827 04/25/13 0730 04/24/13 1103   GLU 263* 284* 237* -- -- --   BUN 90.0* 80.2* 59.2* -- -- --   CREAT 2.9* 3.2* 2.9* -- -- --   CA 8.7 7.8* 7.5* -- -- --   NA 151* 148* 148* -- -- --   K 3.9 4.0 4.2 -- -- --   CL 110* 109* 111* -- -- --   CO2 28 25 24  -- -- --   ALB -- -- -- 2.1* 2.3* 3.4*   PHOS 4.7 3.7 3.6 -- -- --   MG 2.3 2.5 2.5 -- -- --  Lab 04/29/13 0323 04/28/13 0336 04/27/13 0408   WBC 16.50* 15.56* 19.16*   HGB 11.1* 10.7* 11.1*   HCT 36.1* 34.3* 36.0*   MCV 91.9 91.5 91.8   MCH 28.2 28.5 28.3   MCHC 30.7* 31.2* 30.8*   RDW 16* 16* 16*   MPV 11.0 11.9 11.5   PLT 142 137* 134*

## 2013-04-30 ENCOUNTER — Inpatient Hospital Stay: Payer: BC Managed Care – PPO

## 2013-04-30 LAB — CELL MORPHOLOGY
Cell Morphology: ABNORMAL — AB
Platelet Estimate: NORMAL

## 2013-04-30 LAB — COMPREHENSIVE METABOLIC PANEL
ALT: 52 U/L (ref 0–55)
AST (SGOT): 21 U/L (ref 5–34)
Albumin/Globulin Ratio: 0.5 — ABNORMAL LOW (ref 0.9–2.2)
Albumin: 1.9 g/dL — ABNORMAL LOW (ref 3.5–5.0)
Alkaline Phosphatase: 81 U/L (ref 40–150)
Anion Gap: 15 (ref 5.0–15.0)
BUN: 73.9 mg/dL — ABNORMAL HIGH (ref 9.0–21.0)
Bilirubin, Total: 0.5 mg/dL (ref 0.2–1.2)
CO2: 25 mEq/L (ref 22–29)
Calcium: 8.6 mg/dL (ref 8.5–10.5)
Chloride: 107 mEq/L (ref 98–107)
Creatinine: 2.3 mg/dL — ABNORMAL HIGH (ref 0.7–1.3)
Globulin: 3.6 g/dL (ref 2.0–3.6)
Glucose: 231 mg/dL — ABNORMAL HIGH (ref 70–100)
Potassium: 3.9 mEq/L (ref 3.5–5.1)
Protein, Total: 5.5 g/dL — ABNORMAL LOW (ref 6.0–8.3)
Sodium: 147 mEq/L — ABNORMAL HIGH (ref 136–145)

## 2013-04-30 LAB — MAN DIFF ONLY
Band Neutrophils Absolute: 3.42 10*3/uL — ABNORMAL HIGH (ref 0.00–1.00)
Band Neutrophils: 20 %
Basophils Absolute Manual: 0 10*3/uL (ref 0.00–0.20)
Basophils Manual: 0 %
Eosinophils Absolute Manual: 0.34 10*3/uL (ref 0.00–0.70)
Eosinophils Manual: 2 %
Lymphocytes Absolute Manual: 1.54 10*3/uL (ref 0.50–4.40)
Lymphocytes Manual: 9 %
Metamyelocytes Absolute: 0.51 10*3/uL — ABNORMAL HIGH
Metamyelocytes: 3 %
Monocytes Absolute: 1.2 10*3/uL (ref 0.00–1.20)
Monocytes Manual: 7 %
Neutrophils Absolute Manual: 10.09 10*3/uL — ABNORMAL HIGH (ref 1.80–8.10)
Nucleated RBC Absolute: 0.17 10*3/uL — ABNORMAL HIGH
Nucleated RBC: 1 (ref 0–1)
Segmented Neutrophils: 59 %

## 2013-04-30 LAB — CBC AND DIFFERENTIAL
Hematocrit: 35.7 % — ABNORMAL LOW (ref 42.0–52.0)
Hgb: 11.4 g/dL — ABNORMAL LOW (ref 13.0–17.0)
MCH: 28.6 pg (ref 28.0–32.0)
MCHC: 31.9 g/dL — ABNORMAL LOW (ref 32.0–36.0)
MCV: 89.5 fL (ref 80.0–100.0)
MPV: 10.6 fL (ref 9.4–12.3)
Platelets: 178 10*3/uL (ref 140–400)
RBC: 3.99 10*6/uL — ABNORMAL LOW (ref 4.70–6.00)
RDW: 16 % — ABNORMAL HIGH (ref 12–15)
WBC: 17.1 10*3/uL — ABNORMAL HIGH (ref 3.50–10.80)

## 2013-04-30 LAB — PHOSPHORUS: Phosphorus: 4.8 mg/dL — ABNORMAL HIGH (ref 2.3–4.7)

## 2013-04-30 LAB — MAGNESIUM: Magnesium: 2 mg/dL (ref 1.6–2.6)

## 2013-04-30 LAB — GLUCOSE WHOLE BLOOD - POCT
Whole Blood Glucose POCT: 192 mg/dL — ABNORMAL HIGH (ref 70–100)
Whole Blood Glucose POCT: 204 mg/dL — ABNORMAL HIGH (ref 70–100)
Whole Blood Glucose POCT: 215 mg/dL — ABNORMAL HIGH (ref 70–100)
Whole Blood Glucose POCT: 237 mg/dL — ABNORMAL HIGH (ref 70–100)
Whole Blood Glucose POCT: 244 mg/dL — ABNORMAL HIGH (ref 70–100)

## 2013-04-30 LAB — GFR: EGFR: 30.4

## 2013-04-30 MED ORDER — AMLODIPINE BESYLATE 5 MG PO TABS
5.0000 mg | ORAL_TABLET | Freq: Every day | ORAL | Status: DC
Start: 2013-04-30 — End: 2013-05-15
  Administered 2013-04-30 – 2013-05-13 (×14): 5 mg via OROGASTRIC
  Filled 2013-04-30 (×14): qty 1

## 2013-04-30 MED ORDER — SODIUM CHLORIDE 0.9 % IV MBP
1000.0000 mg | INTRAVENOUS | Status: DC
Start: 2013-04-30 — End: 2013-05-07
  Administered 2013-04-30 – 2013-05-06 (×7): 1000 mg via INTRAVENOUS
  Filled 2013-04-30 (×8): qty 1000

## 2013-04-30 MED ORDER — METHYLPREDNISOLONE SODIUM SUCC 40 MG IJ SOLR
20.0000 mg | Freq: Every morning | INTRAMUSCULAR | Status: DC
Start: 2013-05-01 — End: 2013-05-08
  Administered 2013-05-01 – 2013-05-08 (×8): 20 mg via INTRAVENOUS
  Filled 2013-04-30 (×8): qty 1

## 2013-04-30 NOTE — PT Eval Note (Signed)
Physical Therapy Cancellation Note    Patient: Adam Simmons  ZOX:[id number]    Unit: IC05/IC05-A    Patient not seen for physical therapy secondary to high PEEP and sedation. Pt's sedation being lowered per RN Judeth Cornfield. Will follow up as appropriate.      Delfin Edis, PT, DPT  Pager #: [phone]

## 2013-04-30 NOTE — OT Progress Note (Signed)
Occupational Therapy Cancellation Note    Patient: Adam Simmons  ZOX:[id number]    Unit: IC05/IC05-A    Patient not seen for occupational therapy secondary to high PEEP and sedation. P: Will re-attempt as appropriate.     Tennis Ship. Trixie Deis, MS,OTR/L  Pager # [phone]  [phone]

## 2013-04-30 NOTE — Progress Notes (Signed)
Infectious Disease            Progress Note    04/30/2013   Adam Simmons ZOX:[id number] is a 48 y.o. male, history of hypertension, diabetes mellitus, kidney stones. Admitted with septic shock, pneumonia, respiratory failure.    Subjective:     Adam Simmons today Symptoms:  Low-grade fever. Still having diarrhea.  Improving  kidney functions , still leukocytosis but on steroids as well. On ventilator, off pressors, blood cultures growing strep pneumoniae.    Objective:     Blood pressure 167/76, pulse 106, temperature 99.2 F (37.3 C), temperature source Foley Temp, resp. rate 30, height 1.727 m (5\' 8" ), weight 131.6 kg (290 lb 2 oz), SpO2 97.00%.    General Appearance: Intubated and sedated.    HEENT: Pallor negative, Anicteric sclera.  Intubated  Lungs:  Bilateral decreased breath sounds  Heart: Normal rate.  S1 and S2.    Chest Wall: Symmetric chest wall expansion.   Abdomen: Abdomen is soft, scaphoid and non-distended. There are no signs of ascites. Bowel sounds are normal. Rectal tube in place  Neurological: Patient is intubated and sedated    Laboratory And Diagnostic Studies:     Recent Labs   Adam Simmons 04/30/13 0309 04/29/13 0323    WBC 17.10* 16.50*    HGB 11.4* 11.1*    HCT 35.7* 36.1*    PLT 178 142    NEUTRO 59 --     Recent Labs   Basename 04/30/13 0309 04/29/13 0323    NA 147* 151*    K 3.9 3.9    CL 107 110*    CO2 25 28    BUN 73.9* 90.0*    CREAT 2.3* 2.9*    GLU 231* 263*    CA 8.6 8.7       Blood cultures:  Strep pneumoniae      Current Med's:     Current Facility-Administered Medications   Medication Dose Route Frequency   . albuterol-ipratropium  3 mL Nebulization Q4H SCH   . bumetanide  0.5 mg Intravenous 2XDAY at 0800 & 1200   . chlorhexidine  15 mL Mouth/Throat BID   . docusate  100 mg Oral BID   . enoxaparin  40 mg Subcutaneous Daily   . ertapenem  1,000 mg Intravenous Q24H SCH   . famotidine  20 mg per OG tube BID   . insulin aspart  1-12 Units Subcutaneous Q4H   .  insulin glargine  30 Units Subcutaneous QHS   . lactobacillus/streoptococcus  1 capsule Oral Daily   . levofloxacin  750 mg Intravenous Q48H   . methylprednisolone  20 mg Intravenous Q12H SCH   . nystatin  1 application Topical BID   . polyethylene glycol  17 g Oral BID   . thiamine  100 mg Oral Daily   . [DISCONTINUED] bumetanide  1 mg Intravenous 2XDAY at 0800 & 1200   . [DISCONTINUED] ertapenem  500 mg Intravenous Q24H SCH   . [DISCONTINUED] insulin glargine  25 Units Subcutaneous QHS   . [DISCONTINUED] insulin glargine  7 Units Subcutaneous Daily   . [DISCONTINUED] lactobacillus/streoptococcus  1 capsule Oral Daily       Line, Drains, Airways:     NG/OG Tube Orogastric 16 Fr. Center mouth (Active)   Number of days:6       Rectal Tube With balloon (Active)   Number of days:2       Urethral Catheter Non-latex 16 Fr. (Active)   Number of days:6  Arterial Line 04/24/13 Left Radial (Active)   Number of days:6       CVC Triple Lumen 04/24/13 Left Internal jugular (Active)   Number of days:6       Permacath/Temporary Catheter 04/27/13 Temporary Catheter Right (Active)   Number of days:3     Assessment:      Condition.  Guarded   Septic shock; Hoff presents   Strep pneumoniae sepsis     Respiratory failure, status post intubation   Bilateral pneumonia   Possible aspiration   Diabetes mellitus   Acute kidney injury   Thrombocytopenia; resolved   Elevated liver function tests; improved    Plan:      Continue  Invanz   Continue Levaquin   Continue ventilatory support   Continue supportive care   Correction of electrolytes   Nephrology follow up   Stool for C. difficile          Adam Simmons A Adam Simmons, M.D.,FACP  04/30/2013  8:35 AM

## 2013-04-30 NOTE — Progress Notes (Signed)
Daily Progress Note    Date Time: 04/30/2013 6:18 PM  Patient Name: Adam Simmons  Attending Physician: Lawernce Ion, MD  Room: IC05/IC05-A   Admit Date: 04/24/2013  LOS: 6 days        Assessment/Plan:   #Neuro: sedated on propofol and versed drips. RASS  -3, Weaned sedation. Grimaces to painful stimuli     #Cardio: ST. SBP greater than 160. Dr. Lesle Reek notified. amlodipine given-SBP 130s. Low grade fever.     #Resp: RR greater than 30. MD aware. No new orders given.     #GI: rounded, distention- bowel regimen-on hold due to diarrhea. Fecal management device removed. C-diff sent.            #Renal /Fluid, Electrolytes : foley- u/o adequate.      #Endo: accuchecks-coverage given.    #Skin: Desitin applied to peri area.      #Nutrition: TFs-decreased due to propofol drip- see dietician's note,  Minimal residuals.    #Lines: 2PIV, CVC, Quinton     #Prophylaxis:   GI Prophylaxis:  pepcid  VTE Prophylaxis: lovenox and SCDs.    Wife at bedside and updated.        Medications:   Scheduled Meds:  Current Facility-Administered Medications   Medication Dose Route Frequency   . albuterol-ipratropium  3 mL Nebulization Q4H SCH   . amLODIPine  5 mg per OG tube Daily   . chlorhexidine  15 mL Mouth/Throat BID   . docusate  100 mg Oral BID   . enoxaparin  40 mg Subcutaneous Daily   . ertapenem  1,000 mg Intravenous Q24H SCH   . famotidine  20 mg per OG tube BID   . insulin aspart  1-12 Units Subcutaneous Q4H   . insulin glargine  30 Units Subcutaneous QHS   . lactobacillus/streoptococcus  1 capsule Oral Daily   . levofloxacin  750 mg Intravenous Q48H   . methylprednisolone  20 mg Intravenous Q12H SCH   . nystatin  1 application Topical BID   . polyethylene glycol  17 g Oral BID   . thiamine  100 mg Oral Daily   . [DISCONTINUED] bumetanide  0.5 mg Intravenous 2XDAY at 0800 & 1200   . [DISCONTINUED] ertapenem  500 mg Intravenous Q24H SCH         Continuous Infusions:       . sodium chloride 75 mL/hr (04/30/13 1236)   . midazolam  Stopped (04/30/13 1755)   . phenylephrine Stopped (04/28/13 0100)   . propofol 50 mcg/kg/min (04/30/13 1658)        Data:    Vent Settings:    Vent Settings  Vent Mode: PRVC  FiO2: 50 %  Resp Rate (Set): 20   Vt (Set, mL): 400 mL  PIP Observed (cm H2O): 24 cm H2O  PEEP/EPAP: 8 cm H20  Pressure Support / IPAP: 10 cmH20  Mean Airway Pressure: 15 cmH20        Labs:   Labs (last 72 hours):  Recent Labs   Basename 04/30/13 0309 04/29/13 0323 04/28/13 0336    WBC 17.10* 16.50* 15.56*    HGB 11.4* 11.1* 10.7*    HCT 35.7* 36.1* 34.3*    LABPLAT -- -- --     No results found for this basename: PT:3,INR:3,PTT:3 in the last 72 hours Recent Labs   [facility] 04/30/13 0309 04/29/13 0323 04/28/13 0334    NA 147* 151* 148*    K 3.9 3.9 4.0    CL 107 110*  109*    CO2 25 28 25     BUN 73.9* 90.0* 80.2*    CREAT 2.3* 2.9* 3.2*    GLU 231* 263* 284*    CA 8.6 8.7 7.8*    MG 2.0 2.3 2.5    PHOS 4.8* 4.7 3.7

## 2013-04-30 NOTE — Progress Notes (Signed)
Nutritional Support Services  Nutrition Follow Up    Adam Simmons 48 y.o. male   MRN: [id number]        Nutrition Summary/Diet history:  Pt is vented and sedated. Per RN, no bowel since admission and +250 ml residuals today.  Propofol is off.  Nutrition Diagnosis:   Inadequate oral intake related to respiratory failure as evidenced by vent support.    Intervention:  1. While on current Propofol suggest decrease Promote to 35 mL/hr = 840 kcals, 52 g pro and  705 mL free water. (TF+ ProSource + Propofol = 1932 kcals/d).  2. If off Propofol then goal is Promote @ 60 mL/hr = 1440 kcals, 90 gm protein and 1209 ml free water. ( + ProSource 1 pkg q 6 hrs = 1680 kcals and 150 g pro)  3. Free water flush per MD.     Goal: Meet estimated needs with enteral nutrition support while vented.    Monitoring:   Evaluation:   1. Residuals < 500 mL < 325 mL  2. Weights   + 11.4 kg since admission  3. Net I&O   + 7.1 L since admission    Nutrition risk level: Moderate    Assessment Data:  Adm dx:  Respiratory failure   Patient Active Problem List   Diagnosis   . Respiratory failure   . Pneumonia   . Hypokalemia   . ARDS (adult respiratory distress syndrome)   . DM (diabetes mellitus)   . AKI (acute kidney injury)   PMH:  has a past medical history of Hypertension; Diabetes mellitus; and Kidney stones.  PSH:  has past surgical history that includes Kidney stone surgery.  Pertinent labs:   Lab 04/30/13 0309 04/29/13 0323 04/28/13 0334 04/27/13 0408 04/26/13 0304   NA 147* 151* 148* 148* 152*   K 3.9 3.9 4.0 4.2 4.0   CL 107 110* 109* 111* 112*   CO2 25 28 25 24 25    BUN 73.9* 90.0* 80.2* 59.2* 39.3*   CREAT 2.3* 2.9* 3.2* 2.9* 2.2*   GLU 231* 263* 284* 237* 346*   CA 8.6 8.7 7.8* 7.5* 7.2*   MG 2.0 2.3 2.5 2.5 2.0   PHOS 4.8* 4.7 3.7 3.6 3.0   EGFR 30.4 23.3 20.8 23.3 32.0   Blood glucose range (48 hrs) 216-337 mg/dL  Pertinent meds: chlorhexidine, docusate, famotidine, insulin aspart, insulin glargine, lactobacillus/streoptococcus,  methylprednisolone, polyethylene glycol, thiamine, versed, Propofol @ 32.3 mL/hr = 852 kcals/d.  Social history:  married  Food Intolerance/Religious/Ethnic preferences: none noted  Diet Order:  Orders Placed This Encounter   Procedures   . Diet NPO effective now   . Prosource no carb Frequency:: All meals; Quantity:: A. One One packet q 6 hrs.   . Tube feeding-CONTINUOUS   Enteral nutrition:   Indication: vented  Route: OG  Frequency: continuous  Formula: Promote   Rate: 50 mL/hr  Formula provides: 1200 kcals, 75 g pro and 1006 mL free water   Additives: ProSource NoCarb 1 packet q 6 hrs = 240 kcals and 60 g pro   Water flushes: 300 mL q 4 hrs  Residuals: < 325 mL   GI symptoms: abd distended  Hydration: generalized edema  Skin: Stage II sacrum  Anthropometrics  Height: 172.7 cm (5\' 8" )  Weight: 131.6 kg (290 lb 2 oz)  Weight Change: 0   IBW/kg (Calculated) Male: 70.02 kg  IBW/kg (Calculated) Male: 63.62 kg  BMI (calculated): 42.6   Wt Readings from Last  30 Encounters:   04/30/13 131.6 kg (290 lb 2 oz)   04/30/13 131.6 kg (290 lb 2 oz)   Learning Needs: no  Estimated Needs:  Estimated Energy Needs  Total Energy Estimated Needs: 1680-1820 kcals/kg  Method for Estimating Needs: 24-26 kcals/kg IBW  Estimated Protein Needs  Total Protein Estimated Needs: 140 g pro/kg  Method for Estimating Needs: 2 g protein/kg  Fluid Needs  Method for Estimating Needs: per MD  No Known Allergies    Geryl Councilman, RD, CNSC, CSO  Spectralink - [phone]

## 2013-04-30 NOTE — Plan of Care (Signed)
Problem: Hemodynamic Status - Pneumonia  Goal: Vital signs and fluid balance maintained/improved  Outcome: Progressing  Intervention: Monitor and assess vitals and hemodynamic parameters.  Vitals done every hour. Vital signs stable.   Intervention: Monitor and compare daily weight.  Daily weights done in am.   Intervention: Monitor intake and output. Notify LIP if urine output is < 30 ml/hr.  Urine output has been adequate.  Intervention: Assess signs and symptoms of bleeding.  No signs of bleeding noted.       Problem: Infection/Potential for Infection  Goal: Free from infection  Outcome: Progressing  Intervention: Utilize isolation precautions.  No isolation needed at the time      Problem: Impaired Mobility  Goal: Mobility/activity is maintained at optimum level for patient  Outcome: Progressing  Pt unable to do progressive mobility due to PEEP is greater than 8.     Comments:   Continue to monitor.

## 2013-04-30 NOTE — Progress Notes (Addendum)
[facility]- Critical Care Note     ICU Daily Progress Note        Date Time: 04/30/2013 8:15 PM  Patient Name: Adam Simmons  Attending Physician: Lawernce Ion, MD  Room: IC05/IC05-A   Admit Date: 04/24/2013  LOS: 6 days            Assessment:     Patient Active Problem List   Diagnosis   . Respiratory failure   . Pneumonia   . Hypokalemia   . ARDS (adult respiratory distress syndrome)   . DM (diabetes mellitus)   . AKI (acute kidney injury)     Ventilator day 6  Severe pneumococcal pneumonia.  AKI, dialyzed yesterday.  Renal function , urine output improved, no HD today.  Fails weaning criteria.  hyperglycemia  Htn    Plan:   Abx, bronchodilators, f/u fluid/electrolyte rx. Vent support, enteral feeds.  Insulin rx.  Decrease steroids.  Norvasc added    Subjective:   sedated    Medications:       Scheduled Meds: PRN Meds:         albuterol-ipratropium 3 mL Nebulization Q4H SCH   amLODIPine 5 mg per OG tube Daily   chlorhexidine 15 mL Mouth/Throat BID   docusate 100 mg Oral BID   enoxaparin 40 mg Subcutaneous Daily   ertapenem 1,000 mg Intravenous Q24H SCH   famotidine 20 mg per OG tube BID   insulin aspart 1-12 Units Subcutaneous Q4H   insulin glargine 30 Units Subcutaneous QHS   lactobacillus/streoptococcus 1 capsule Oral Daily   levofloxacin 750 mg Intravenous Q48H   methylprednisolone 20 mg Intravenous Q12H SCH   nystatin 1 application Topical BID   polyethylene glycol 17 g Oral BID   thiamine 100 mg Oral Daily   [DISCONTINUED] bumetanide 0.5 mg Intravenous 2XDAY at 0800 & 1200   [DISCONTINUED] ertapenem 500 mg Intravenous Q24H SCH       Continuous Infusions:       . sodium chloride 75 mL/hr (04/30/13 1236)   . midazolam Stopped (04/30/13 1755)   . phenylephrine Stopped (04/28/13 0100)   . propofol 40 mcg/kg/min (04/30/13 1941)         acetaminophen 500 mg Q4H PRN   albuterol 2.5 mg Q1H PRN   dextrose 15 g PRN   dextrose 25 mL PRN   glucagon (rDNA) 1 mg PRN   midazolam 2 mg Q1H PRN   morphine 2 mg Q2H  PRN   vecuronium 10 mg Q6H PRN   zinc Oxide  PRN             Physical Exam:     Filed Vitals:    04/30/13 1800 04/30/13 1900 04/30/13 1941 04/30/13 1946   BP: 173/83      Pulse: 105 108  109   Temp:       TempSrc: Foley Temp Foley Temp     Resp: 33 32  36   Height:       Weight:       SpO2: 96% 96% 96% 99%     No data recorded.           02/08 0701 - 02/09 0700  In: 5501.3 [I.V.:2650.3]  Out: 8600 [Urine:7550]       General Appearance: on vent  Mental status: sedated  Neuro:  Moves ext, + cough  H & N: obese, no jvd   Lungs: rhonchi bilat  Cardiac: reg  Abdomen:  nt  Extremities+ 1 edema  Skin: warm      Data:       Vent Settings:    Vent Settings  Vent Mode: PRVC  FiO2: 50 %  Resp Rate (Set): 20   Vt (Set, mL): 400 mL  PIP Observed (cm H2O): 21 cm H2O  PEEP/EPAP: 8 cm H20  Pressure Support / IPAP: 10 cmH20  Mean Airway Pressure: 13 cmH20      Labs:     Recent CBC   Lab 04/30/13 0309 04/29/13 0323 04/28/13 0336   WBC 17.10* 16.50* 15.56*   RBC 3.99* 3.93* 3.75*   HGB 11.4* 11.1* 10.7*   HCT 35.7* 36.1* 34.3*   MCV 89.5 91.9 91.5   PLT 178 142 137*         Lab 04/30/13 0309 04/29/13 0323 04/28/13 0334 04/25/13 1827 04/25/13 1401 04/25/13 0730 04/25/13 0219 04/25/13 0117 04/24/13 1759 04/24/13 1103   NA 147* 151* 148* -- -- -- -- -- -- --   K 3.9 3.9 4.0 -- -- -- -- -- -- --   CL 107 110* 109* -- -- -- -- -- -- --   CO2 25 28 25  -- -- -- -- -- -- --   GLU 231* 263* 284* -- -- -- -- -- -- --   BUN 73.9* 90.0* 80.2* -- -- -- -- -- -- --   CREAT 2.3* 2.9* 3.2* -- -- -- -- -- -- --   MG 2.0 2.3 2.5 -- -- -- -- -- -- --   PHOS 4.8* 4.7 3.7 -- -- -- -- -- -- --   AST 21 -- -- 114* -- 191* -- -- -- --   ALT 52 -- -- 125* -- 126* -- -- -- --   ALKPHOS 81 -- -- 48 -- 49 -- -- -- --   BILITOTAL 0.5 -- -- 1.4* -- 2.0* -- -- -- --   BILIDIRECT -- -- -- -- -- -- -- -- -- --   LIP -- -- -- -- -- -- -- -- -- --   BNP -- -- -- -- -- -- -- -- -- 494.6*   PT -- -- -- -- -- -- -- -- -- --   INR -- -- -- -- -- -- -- -- -- --   PTT -- --  -- -- -- -- -- -- -- --   DDIMER -- -- -- -- -- -- -- -- -- 1.35*   CK -- -- -- -- 283* -- 73 -- 101 --   CKMB -- -- -- -- 3.1 -- -- -- -- --   TROPI -- -- -- -- 0.12* -- -- 0.01 0.02 --   MYOGLOBIN -- -- -- -- -- -- -- -- -- --           Rads:     Radiology Results (24 Hour)     Procedure Component Value Units Date/Time    XR Chest AP Portable [[id number]] Collected:04/30/13 0558    Order Status:Completed  Updated:04/30/13 4098    Narrative:    HISTORY: Respiratory distress.    COMPARISON: Comparison is made to chest x-ray dated 04/27/2013.    TECHNIQUE: Portable view of the chest.    FINDINGS:   Multiple cardiac monitoring wires    overlie the chest. Endotracheal  tube terminates 4.1 cm above the carina.   Enteric catheter traverses  below the gastroesophageal junction.   Left internal jugular catheter  extends to the superior vena cava. There is a right internal jugular  catheter extending  to the superior vena cava.      Bilateral airspace opacities consistent with multifocal pneumonia are  unchanged. There is no pneumothorax. There is no large pleural effusion.  Heart size and mediastinal contour appear unchanged.    Visualized osseous structures are unremarkable.      Impression:       1. No change in the bilateral airspace opacities.  2.  Lines and tubes as described above.    Neldon Mc, MD   04/30/2013 6:33 AM          Radiological Imaging personally reviewed.    I have personally reviewed the patient's history and 24 hour interval events, along with vitals, labs, radiology images and  ventilator settings and additional findings found in detail within ICU team notes, with their care plans developed with and reviewed by me.         Signed by: Durward Fortes, MD  Date/Time: 04/30/2013 8:15 PM

## 2013-04-30 NOTE — Plan of Care (Signed)
Problem: Potential for Compromised Hemodynamic Status  Goal: Stable vital signs and fluid balance  Outcome: Progressing  Dr. Lesle Reek aware that patient is more tachypneic and hypertensive than earlier, with grimacing face.  Ordered to increase propofol gtt.

## 2013-04-30 NOTE — Progress Notes (Signed)
[facility], [employer].  Progress Note    Assessment:  1.AKI- non-oliguric   2. Pneumonia with ARDS- on ventilator  3. Septic shock on pressors  4. Severe acidosis - combined respiratory and metabolic  5. DM on Metformin  6. HTN  7. Kidney stones  8. Exposure to NSAIDs prior to admission  9. Hypernatremia    Plan:  1.Ventilator support  2. Hemodynamic support - off pressors  3. IV fluids - 1/2 Saline  4. Daily labs  5. Adjust meds based on GFR < 30  6. Requested a Quinton catheter 04/27/13.  7. PEG feeding with water flushes  8. Dialysis 2/8  9. D/C Bumex  10. Hold off on dialysis 04/30/13.    Herbie Drape, MD  Office - [phone]  ++++++++++++++++++++++++++++++++++++++++++++++++++++++++++++++  Subjective:  Intubated on vent    Medications:  Scheduled Meds:  Current Facility-Administered Medications   Medication Dose Route Frequency   . albuterol-ipratropium  3 mL Nebulization Q4H SCH   . chlorhexidine  15 mL Mouth/Throat BID   . docusate  100 mg Oral BID   . enoxaparin  40 mg Subcutaneous Daily   . ertapenem  1,000 mg Intravenous Q24H SCH   . famotidine  20 mg per OG tube BID   . insulin aspart  1-12 Units Subcutaneous Q4H   . insulin glargine  30 Units Subcutaneous QHS   . lactobacillus/streoptococcus  1 capsule Oral Daily   . levofloxacin  750 mg Intravenous Q48H   . methylprednisolone  20 mg Intravenous Q12H SCH   . nystatin  1 application Topical BID   . polyethylene glycol  17 g Oral BID   . thiamine  100 mg Oral Daily   . [DISCONTINUED] bumetanide  0.5 mg Intravenous 2XDAY at 0800 & 1200   . [DISCONTINUED] ertapenem  500 mg Intravenous Q24H SCH   . [DISCONTINUED] insulin glargine  25 Units Subcutaneous QHS   . [DISCONTINUED] insulin glargine  7 Units Subcutaneous Daily   . [DISCONTINUED] lactobacillus/streoptococcus  1 capsule Oral Daily     Continuous Infusions:       . sodium chloride 75 mL/hr at 04/29/13 2354   . midazolam 3 mg/hr (04/30/13 0348)   . phenylephrine Stopped (04/28/13  0100)   . propofol 40 mcg/kg/min (04/30/13 0910)   . [DISCONTINUED] midazolam 6 mg/hr (04/29/13 1223)     PRN Meds:acetaminophen, albuterol, dextrose, dextrose, glucagon (rDNA), midazolam, morphine, vecuronium, zinc Oxide, [DISCONTINUED] fentaNYL    Objective:  Vital signs in last 24 hours:  Temp:  [98.5 F (36.9 C)-99.2 F (37.3 C)] 99.2 F (37.3 C)  Heart Rate:  [103-120] 106   Resp Rate:  [26-36] 30   BP: (118-167)/(63-78) 167/76 mmHg  Arterial Line BP: (118-173)/(61-76) 157/71 mmHg  FiO2:  [50 %] 50 %    Intake/Output from yesterday (07:01 - 07:00):  02/08 0701 - 02/09 0700  In: 5501.3 [I.V.:2650.3]  Out: 8600 [Urine:7550]     Physical Exam:   Gen: WD WN NAD   CV: S1 S2 N RRR   Chest: CTAB   Ab: ND NT soft no HSM +BS   Ext: No C/E    Labs:    Lab 04/30/13 0309 04/29/13 0323 04/28/13 0334 04/25/13 1827 04/25/13 0730   GLU 231* 263* 284* -- --   BUN 73.9* 90.0* 80.2* -- --   CREAT 2.3* 2.9* 3.2* -- --   CA 8.6 8.7 7.8* -- --   NA 147* 151* 148* -- --  K 3.9 3.9 4.0 -- --   CL 107 110* 109* -- --   CO2 25 28 25  -- --   ALB 1.9* -- -- 2.1* 2.3*   PHOS 4.8* 4.7 3.7 -- --   MG 2.0 2.3 2.5 -- --       Lab 04/30/13 0309 04/29/13 0323 04/28/13 0336   WBC 17.10* 16.50* 15.56*   HGB 11.4* 11.1* 10.7*   HCT 35.7* 36.1* 34.3*   MCV 89.5 91.9 91.5   MCH 28.6 28.2 28.5   MCHC 31.9* 30.7* 31.2*   RDW 16* 16* 16*   MPV 10.6 11.0 11.9   PLT 178 142 137*

## 2013-05-01 LAB — COMPREHENSIVE METABOLIC PANEL
ALT: 40 U/L (ref 0–55)
AST (SGOT): 20 U/L (ref 5–34)
Albumin/Globulin Ratio: 0.5 — ABNORMAL LOW (ref 0.9–2.2)
Albumin: 1.9 g/dL — ABNORMAL LOW (ref 3.5–5.0)
Alkaline Phosphatase: 82 U/L (ref 40–150)
Anion Gap: 14 (ref 5.0–15.0)
BUN: 67.2 mg/dL — ABNORMAL HIGH (ref 9.0–21.0)
Bilirubin, Total: 0.5 mg/dL (ref 0.2–1.2)
CO2: 27 mEq/L (ref 22–29)
Calcium: 8.4 mg/dL — ABNORMAL LOW (ref 8.5–10.5)
Chloride: 106 mEq/L (ref 98–107)
Creatinine: 2.2 mg/dL — ABNORMAL HIGH (ref 0.7–1.3)
Globulin: 4 g/dL — ABNORMAL HIGH (ref 2.0–3.6)
Glucose: 178 mg/dL — ABNORMAL HIGH (ref 70–100)
Potassium: 4.3 mEq/L (ref 3.5–5.1)
Protein, Total: 5.9 g/dL — ABNORMAL LOW (ref 6.0–8.3)
Sodium: 147 mEq/L — ABNORMAL HIGH (ref 136–145)

## 2013-05-01 LAB — MAN DIFF ONLY
Band Neutrophils Absolute: 0.72 10*3/uL (ref 0.00–1.00)
Band Neutrophils: 4 %
Basophils Absolute Manual: 0 10*3/uL (ref 0.00–0.20)
Basophils Manual: 0 %
Eosinophils Absolute Manual: 0.36 10*3/uL (ref 0.00–0.70)
Eosinophils Manual: 2 %
Lymphocytes Absolute Manual: 3.04 10*3/uL (ref 0.50–4.40)
Lymphocytes Manual: 17 %
Monocytes Absolute: 0.72 10*3/uL (ref 0.00–1.20)
Monocytes Manual: 4 %
Neutrophils Absolute Manual: 13.07 10*3/uL — ABNORMAL HIGH (ref 1.80–8.10)
Nucleated RBC: 0 (ref 0–1)
Segmented Neutrophils: 73 %

## 2013-05-01 LAB — GLUCOSE WHOLE BLOOD - POCT
Whole Blood Glucose POCT: 171 mg/dL — ABNORMAL HIGH (ref 70–100)
Whole Blood Glucose POCT: 206 mg/dL — ABNORMAL HIGH (ref 70–100)
Whole Blood Glucose POCT: 221 mg/dL — ABNORMAL HIGH (ref 70–100)
Whole Blood Glucose POCT: 217 mg/dL — ABNORMAL HIGH (ref 70–100)
Whole Blood Glucose POCT: 183 mg/dL — ABNORMAL HIGH (ref 70–100)

## 2013-05-01 LAB — CELL MORPHOLOGY
Cell Morphology: ABNORMAL — AB
Platelet Estimate: NORMAL

## 2013-05-01 LAB — CBC AND DIFFERENTIAL
Hematocrit: 36.3 % — ABNORMAL LOW (ref 42.0–52.0)
Hgb: 11.5 g/dL — ABNORMAL LOW (ref 13.0–17.0)
MCH: 28.2 pg (ref 28.0–32.0)
MCHC: 31.7 g/dL — ABNORMAL LOW (ref 32.0–36.0)
MCV: 89 fL (ref 80.0–100.0)
MPV: 10 fL (ref 9.4–12.3)
Platelets: 223 10*3/uL (ref 140–400)
RBC: 4.08 10*6/uL — ABNORMAL LOW (ref 4.70–6.00)
RDW: 16 % — ABNORMAL HIGH (ref 12–15)
WBC: 17.91 10*3/uL — ABNORMAL HIGH (ref 3.50–10.80)

## 2013-05-01 LAB — PHOSPHORUS: Phosphorus: 5.6 mg/dL — ABNORMAL HIGH (ref 2.3–4.7)

## 2013-05-01 LAB — GFR: EGFR: 32

## 2013-05-01 LAB — MAGNESIUM: Magnesium: 2.1 mg/dL (ref 1.6–2.6)

## 2013-05-01 NOTE — Progress Notes (Signed)
Infectious Disease            Progress Note    05/01/2013   Adam Simmons FAO:[id number],GEX:[id number] is a 48 y.o. male, history of hypertension, diabetes mellitus, kidney stones. Admitted with septic shock, pneumonia, respiratory failure.    Subjective:     Adam Simmons today Symptoms: Afebrile, improving diarrhea, C. Difficile, negative.  Improving  kidney functions , still leukocytosis but on steroids as well, On ventilator    Objective:     Blood pressure 134/76, pulse 106, temperature 98 F (36.7 C), temperature source Foley Temp, resp. rate 34, height 1.727 m (5\' 8" ), weight 128.5 kg (283 lb 4.7 oz), SpO2 96.00%.    General Appearance: Intubated and sedated.    HEENT: Pallor negative, Anicteric sclera.  Intubated  Lungs:  Bilateral decreased breath sounds  Heart: Normal rate.  S1 and S2.    Chest Wall: Symmetric chest wall expansion.   Abdomen: Abdomen is soft, scaphoid and non-distended. There are no signs of ascites. Bowel sounds are normal. Rectal tube in place  Neurological: Patient is intubated and sedated    Laboratory And Diagnostic Studies:     Recent Labs   [facility] 05/01/13 0345 04/30/13 0309    WBC 17.91* 17.10*    HGB 11.5* 11.4*    HCT 36.3* 35.7*    PLT 223 178    NEUTRO 73 59     Recent Labs   Basename 05/01/13 0345 04/30/13 0309    NA 147* 147*    K 4.3 3.9    CL 106 107    CO2 27 25    BUN 67.2* 73.9*    CREAT 2.2* 2.3*    GLU 178* 231*    CA 8.4* 8.6       Blood cultures:  Strep pneumoniae  C. Difficile: negative      Current Med's:     Current Facility-Administered Medications   Medication Dose Route Frequency   . albuterol-ipratropium  3 mL Nebulization Q4H SCH   . amLODIPine  5 mg per OG tube Daily   . chlorhexidine  15 mL Mouth/Throat BID   . docusate  100 mg Oral BID   . enoxaparin  40 mg Subcutaneous Daily   . ertapenem  1,000 mg Intravenous Q24H SCH   . famotidine  20 mg per OG tube BID   . insulin aspart  1-12 Units Subcutaneous Q4H   . insulin glargine  30 Units  Subcutaneous QHS   . lactobacillus/streoptococcus  1 capsule Oral Daily   . levofloxacin  750 mg Intravenous Q48H   . methylprednisolone  20 mg Intravenous QAM   . nystatin  1 application Topical BID   . polyethylene glycol  17 g Oral BID   . thiamine  100 mg Oral Daily   . [DISCONTINUED] methylprednisolone  20 mg Intravenous Q12H [facility]       Line, Drains, Airways:     NG/OG Tube Orogastric 16 Fr. Center mouth (Active)   Number of days:7       Urethral Catheter Non-latex 16 Fr. (Active)   Number of days:7         Arterial Line 04/24/13 Left Radial (Active)   Number of days:7       CVC Triple Lumen 04/24/13 Left Internal jugular (Active)   Number of days:7       Permacath/Temporary Catheter 04/27/13 Temporary Catheter Right (Active)   Number of days:4         Assessment:  Condition.  Guarded   Septic shock; Hoff presents   Strep pneumoniae sepsis     Respiratory failure, status post intubation   Bilateral pneumonia   Possible aspiration   Diabetes mellitus   Acute kidney injury   Thrombocytopenia; resolved   Elevated liver function tests; improved    Plan:      Continue  Invanz   Continue Levaquin   Continue ventilatory support   Continue supportive care   Correction of electrolytes   Nephrology follow up   Discussed with Dr. Roxan Hockey, M.D.,FACP  05/01/2013  9:40 AM

## 2013-05-01 NOTE — Progress Notes (Signed)
[facility]- Critical Care Note     ICU Daily Progress Note        Date Time: 05/01/2013 11:47 AM  Patient Name: Adam Simmons  Attending Physician: Lawernce Ion, MD  Room: IC05/IC05-A   Admit Date: 04/24/2013  LOS: 7 days        Assessment/Plan:   # Neurologic: sedation vacation planned for today. Critical care myopathy.     # Respiratory: Respiratory failure from what appears to be multilobar   pneumonia with hypoxia on ventilator. Resolving ARDS.     # Nutrition: appreciate nutritionist recommendation, cont tube feeds    # Endocrine: Diabetes; uncontrolled, cont high dose sliding-scale insulin and increase lantus 30 units in pm.     # Cardiac: septic shock resolved. Off pressors.  Echo indicated EF 50%    # Hematologic: Thrombocytopenia most likely from underlying sepsis     #renal: acute kidney injury, appreciate Dr. Cleophus Molt recs.  No emergent need for HD today. Last dialysis 2/8.    # Infectious diseases: streptococcus pneumonia bacteremia/ pneumonia.  Appreciate Dr. Myrtis Ser consultation and evaluation. Continue antibiotics    Code Status: full code     I have personally reviewed the patient's history and 24 hour interval events, along with vitals, labs, radiology images, and nurses report.         Subjective:     48 y.o. male h/o DM type 2, HTN who presents to the hospital with respiratory failure, from streptococcus pneumonia, ARDS, AKI and septic shock.        Off sedation, patient can track with his eyes. Generalized weakness.        Hospital Course  2/3   ARDS / PNA / Sepsis  w/ resp failure- intubated,  Flu test-neg  Resp c+s= gm + cocci , x2BC= gm + cocci resembling strep  2/6 Quinton placed for worsening creatinine, 14 L positive  2/8 dialysis  2/10 failed SBT  Medications:   Scheduled Meds:  Current Facility-Administered Medications   Medication Dose Route Frequency   . albuterol-ipratropium  3 mL Nebulization Q4H SCH   . amLODIPine  5 mg per OG tube Daily   . chlorhexidine  15 mL  Mouth/Throat BID   . docusate  100 mg Oral BID   . enoxaparin  40 mg Subcutaneous Daily   . ertapenem  1,000 mg Intravenous Q24H SCH   . famotidine  20 mg per OG tube BID   . insulin aspart  1-12 Units Subcutaneous Q4H   . insulin glargine  30 Units Subcutaneous QHS   . lactobacillus/streoptococcus  1 capsule Oral Daily   . levofloxacin  750 mg Intravenous Q48H   . methylprednisolone  20 mg Intravenous QAM   . nystatin  1 application Topical BID   . polyethylene glycol  17 g Oral BID   . thiamine  100 mg Oral Daily   . [DISCONTINUED] methylprednisolone  20 mg Intravenous Q12H [facility]         Continuous Infusions:       . sodium chloride 50 mL/hr (04/30/13 2027)   . midazolam Stopped (04/30/13 1755)   . phenylephrine Stopped (04/28/13 0100)   . propofol 40 mcg/kg/min (05/01/13 1051)          Physical Exam:     Filed Vitals:    05/01/13 1100   BP: 149/84   Pulse: 109   Temp:    Resp:    SpO2: 96%  Intake/Output Summary (Last 24 hours) at 05/01/13 1147  Last data filed at 05/01/13 0600   Gross per 24 hour   Intake 4395.8 ml   Output   5500 ml   Net -1104.2 ml     General Appearance: obese, awake, intubated   Mental status: awake  Neuro: awake, diffusely weak, intubated   Neck: supple   Lungs: bilateral rhonchi anteriorly, no wheeze  Cardiac: ns1, ns2, no m/r/g, tachycardic   Abdomen: Obese, soft, nontender, hypoactive bowel sounds   Extremities: trace lower extremity edema   Skin: Has band like abrasion around bilateral ankles, has a triangular mark on right forearm area that is scabbed over, has abrasion on left forearm, has grease on his hands, has abrasions on his sacral area, has various bruising on his upper extremities.  Anasarca improved.  Fluid filled blister on left lower foot with no erythema improved.       Data:       Invasive ICU Hemodynamics:    Invasive Hemodynamic Monitoring  CVP (mmHg): 22 mmHg  CO (L/min): 12.8 L/min  CI (L/min/m2): 5.3 L/min/m2    Art Line  Arterial Line BP: 153/73 mmHg  Arterial  Line MAP (mmHg): 96 mmHg  Arterial Line Location: Left radial  Art Line Wave Form: Appropriate wave forms      Vent Settings:    Vent Settings  Vent Mode: PRVC  FiO2: 50 %  Resp Rate (Set): 20   Vt (Set, mL): 400 mL  PIP Observed (cm H2O): 25 cm H2O  PEEP/EPAP: 8 cm H20  Pressure Support / IPAP: 10 cmH20  Mean Airway Pressure: 16 cmH20        Labs:         Labs (last 72 hours):  Recent Labs   Basename 05/01/13 0345 04/30/13 0309 04/29/13 0323    WBC 17.91* 17.10* 16.50*    HGB 11.5* 11.4* 11.1*    HCT 36.3* 35.7* 36.1*    LABPLAT -- -- --     No results found for this basename: PT:3,INR:3,PTT:3 in the last 72 hours Recent Labs   Basename 05/01/13 0345 04/30/13 0309 04/29/13 0323    NA 147* 147* 151*    K 4.3 3.9 3.9    CL 106 107 110*    CO2 27 25 28     BUN 67.2* 73.9* 90.0*    CREAT 2.2* 2.3* 2.9*    GLU 178* 231* 263*    CA 8.4* 8.6 8.7    MG 2.1 2.0 2.3    PHOS 5.6* 4.8* 4.7                     Rads:   Radiological Imaging personally reviewed,and agree with radiology report including:       CXR  04/26/2013    mildy improved multilobar pneumonia      I have personally reviewed the patient's history and 24 hour interval events, along with vitals, labs, radiology images and nursing.         Signed by: Lawernce Ion, MD  Date/Time: 05/01/2013 11:47 AM

## 2013-05-01 NOTE — Progress Notes (Signed)
[facility], [employer].  Progress Note    Assessment:  1.AKI- non-oliguric   2. Pneumonia with ARDS- on ventilator  3. Septic shock on pressors  4. Severe acidosis - combined respiratory and metabolic  5. DM on Metformin  6. HTN  7. Kidney stones  8. Exposure to NSAIDs prior to admission  9. Hypernatremia    Plan:  1.Ventilator support  2. Hemodynamic support - off pressors  3. IV fluids - 1/2 Saline  4. Daily labs  5. Adjust meds based on GFR < 30  6. Requested a Quinton catheter 04/27/13.  7. PEG feeding with water flushes- 300 cc q 4 hours  8. Dialysis 2/8  9. D/C Bumex  10. Hold off on dialysis .    Herbie Drape, MD  Office - [phone]  ++++++++++++++++++++++++++++++++++++++++++++++++++++++++++++++  Subjective:  Intubated on vent    Medications:  Scheduled Meds:  Current Facility-Administered Medications   Medication Dose Route Frequency   . albuterol-ipratropium  3 mL Nebulization Q4H SCH   . amLODIPine  5 mg per OG tube Daily   . chlorhexidine  15 mL Mouth/Throat BID   . docusate  100 mg Oral BID   . enoxaparin  40 mg Subcutaneous Daily   . ertapenem  1,000 mg Intravenous Q24H SCH   . famotidine  20 mg per OG tube BID   . insulin aspart  1-12 Units Subcutaneous Q4H   . insulin glargine  30 Units Subcutaneous QHS   . lactobacillus/streoptococcus  1 capsule Oral Daily   . levofloxacin  750 mg Intravenous Q48H   . methylprednisolone  20 mg Intravenous QAM   . nystatin  1 application Topical BID   . polyethylene glycol  17 g Oral BID   . thiamine  100 mg Oral Daily   . [DISCONTINUED] methylprednisolone  20 mg Intravenous Q12H [facility]     Continuous Infusions:       . sodium chloride 50 mL/hr (04/30/13 2027)   . midazolam Stopped (04/30/13 1755)   . phenylephrine Stopped (04/28/13 0100)   . propofol 40 mcg/kg/min (05/01/13 1051)     PRN Meds:acetaminophen, albuterol, dextrose, dextrose, glucagon (rDNA), midazolam, morphine, vecuronium, zinc Oxide    Objective:  Vital signs in last 24  hours:  Temp:  [98 F (36.7 C)] 98 F (36.7 C)  Heart Rate:  [104-116] 109   Resp Rate:  [29-36] 34   BP: (134-176)/(76-90) 149/84 mmHg  Arterial Line BP: (141-165)/(67-79) 153/73 mmHg  FiO2:  [50 %] 50 %    Intake/Output from yesterday (07:01 - 07:00):  02/09 0701 - 02/10 0700  In: 5895.2 [I.V.:2904.2]  Out: 7500 [Urine:7500]     Physical Exam:   Gen: WD WN NAD   CV: S1 S2 N RRR   Chest: CTAB   Ab: ND NT soft no HSM +BS   Ext: No C/E    Labs:    Lab 05/01/13 0345 04/30/13 0309 04/29/13 0323 04/25/13 1827   GLU 178* 231* 263* --   BUN 67.2* 73.9* 90.0* --   CREAT 2.2* 2.3* 2.9* --   CA 8.4* 8.6 8.7 --   NA 147* 147* 151* --   K 4.3 3.9 3.9 --   CL 106 107 110* --   CO2 27 25 28  --   ALB 1.9* 1.9* -- 2.1*   PHOS 5.6* 4.8* 4.7 --   MG 2.1 2.0 2.3 --       Lab 05/01/13 0345 04/30/13 0309 04/29/13 0981  WBC 17.91* 17.10* 16.50*   HGB 11.5* 11.4* 11.1*   HCT 36.3* 35.7* 36.1*   MCV 89.0 89.5 91.9   MCH 28.2 28.6 28.2   MCHC 31.7* 31.9* 30.7*   RDW 16* 16* 16*   MPV 10.0 10.6 11.0   PLT 223 178 142

## 2013-05-01 NOTE — OT Eval Note (Signed)
[facility]  [address]  [place], [place]. [address]    Department of Rehabilitation Services  [phone]    Occupational Therapy Evaluation    Patient: Adam Simmons    MRN#: [id number]     Time of treatment: Time Calculation  OT Received On: 05/01/13  Start Time: 1435  Stop Time: 1451  Time Calculation (min): 16 min       Consult received for Adam Simmons for OT Evaluation and Treatment.  Patient's medical condition is appropriate for Occupational therapy intervention at this time.    Assessment:   Adam Simmons is a 48 y.o. male admitted 04/24/2013 presenting with respiratory failure requiring intubation.  Impairments: Assessment: decreased ROM;decreased strength;balance deficits;decreased independence with ADLs    Therapy Diagnosis: Decreased Independence and Safety w/ ADL's; Generalized Weakness    Rehabilitation Potential: Prognosis: Ongoing OT assessment needed;Patient remains in ICU/critical care      Plan:   OT Frequency Recommended: 1-2x/wk   Treatment Interventions: ADL retraining;UE strengthening/ROM;Functional transfer training;Patient/Family training;Neuro muscular reeducation;Fine motor coordination activities     Patient Goal  Patient Goal:  (No stated goals at this time;Patient intubated)    Risks/benefits/POC discussed with Patient.    Goals:   Goal Formulation: Patient  Time For Goal Achievement: by time of discharge  ADL Goals  Patient will groom self: with moderate assist;5 visits     Neuro Re-Ed Goals  Pt will sit at edge of bed: with moderate assist;for 5 minutes;to prepare for OOB tasks;5 visits  Musculoskeletal Goals  Pt will increase AROM: right;left;shoulder;elbow;to increase engagement in ADLs;5 visits;by 50% (and digit)  Executive Fucntion Goals  Other Goal:  (In 5 visits, patient will follow 5 commands with Min A to increase awareness and prepare for ADL's)                Discharge Recommendations:   Discharge Recommendation:  (TBD based upon Patient's ability to be  weaned from vent)            Precautions and Contraindications: Falls Risk; A-line         Medical Diagnosis: Respiratory failure [518.81]  Pneumonia [486]  Respiratory failure  AKI    History of Present Illness: Adam Simmons is a 48 y.o. male admitted on 04/24/2013 with respiratory failure. Per ER report, patient had approx. 1 week history of shortness of breath and URI symptoms.  He had worsening shortness of breath and required intubation. Patient arrived to ICU with high ventilatory settings.  He was also hypotensive despite aggressive fluid resuscitation.  He had documented fevers--as per H & P note.       Patient Active Problem List   Diagnosis   . Respiratory failure   . Pneumonia   . Hypokalemia   . ARDS (adult respiratory distress syndrome)   . DM (diabetes mellitus)   . AKI (acute kidney injury)        Past Medical/Surgical History:  Past Medical History   Diagnosis Date   . Hypertension    . Diabetes mellitus    . Kidney stones       Past Surgical History   Procedure Date   . Kidney stone surgery            Social History:  Prior Level of Function  Prior level of function: Independent with ADLs;Ambulates independently  Baseline Activity Level: Community ambulation  Driving: independent  Home Living Arrangements  Living Arrangements: Spouse/significant other  Type of Home: House  Home Layout: One level;Performs  ADL's on one level;Ramped entrance (1 STE)  Home Living - Notes / Comments: Per CM notes      Subjective:   Patient is unable to indicate agreement for the therapy session but is able to participate in the selected activities. Nursing clears patient for therapy.  Subjective:  (Patient currently intubated and sedated) RN Cecile reports Patient's sedation lowered earlier today, now back currently back on.    .        Objective:   Observation of Patient/Vital Signs:  Patient is in bed with telemetry, A-line, peripheral IV, mechanical ventilation via oral intubation and indwelling urinary catheter in  place.         Cognition  Arousal/Alertness: Unresponsive to stimuli  Orientation Level: Oriented to person (slightly opened eyes to his name)  Insights: Not aware of deficits  Neuro Status  Behavior: cooperative;calm  Coordination: FMC impaired;GMC impaired    Gross ROM  Right Upper Extremity ROM: within functional limits  Left Upper Extremity ROM: within functional limits  Gross Strength  Right Upper Extremity Strength: unable to assess  Left Upper Extremity Strength: unable to assess          Sensory  Tactile - Light Touch:  (No response to deep pressure/painful stim B/L hands)       Self-care and Home Management  Eating:  (Dependent with all self-care tasks at this time)               Participation and Endurance  Participation Effort: poor  Endurance: Tolerates < 10 min exercise, no significant change in vital signs. Patient's RR remained in the 30's during the session.     Treatment Activities: Therapeutic Exercises:Patient guided thru the following UB ROM exercises to faciltate increased endurance and strength for ADL's:  Shoulder PROM: Flexion;Extension 1 x 5 reps  Shoulder PROM; Horizontal Abd/Adduction 1 x 5 reps  Elbow PROM: Flexion;Extension 1 x 5 reps  Wrist PROM: Flexion;Extension 1 x  Reps ( R wrist only due to A-line L wrist)  Digits PROM Flexion/Extension 1 x 5 reps  Patient's B/L UEs positioned on pillows at end of session in efforts to reduce edema; towel roll placed in Patient's R hand to maintain digit extension for contracture prevention.     Educated the patient to role of occupational therapy, plan of care, goals of therapy.    Tennis Ship. Trixie Deis, MS,OTR/L  Pager # [phone]  [phone]

## 2013-05-01 NOTE — PT Eval Note (Signed)
[facility]  [address]  [place], [place]. [address]    Department of Rehabilitation  [phone]    Physical Therapy Evaluation    Patient: Adam Simmons    MRN#: [id number]     Time of treatment: Time Calculation  PT Received On: 05/01/13  Start Time: 1206  Stop Time: 1223  Time Calculation (min): 17 min    PT Visit Number: 1    Consult received for Adam Simmons for PT Evaluation and Treatment.  Patient's medical condition is appropriate for Physical therapy intervention at this time.      Assessment:   Adam Simmons is a 48 y.o. male admitted 04/24/2013 presenting with respiratory failure requiring intubation.    Impairments: Assessment: Decreased endurance/activity tolerance;Decreased functional mobility.     Therapy Diagnosis: Decreased endurance     Rehabilitation Potential: Prognosis: Ongoing PT assessment needed;Patient remains in ICU/critical care      Plan:    Treatment/Interventions: Neuromuscular re-education;Functional transfer training;LE strengthening/ROM;Cognitive reorientation;Endurance training;Bed mobility PT Frequency: 2-3x/wk    Risks/Benefits/POC Discussed with Pt/Family: Patient unable to participate in goal setting     Goals:   Goals  Goal Formulation: Patient unable to participate in goal setting  Time for Goal Acheivement: By time of discharge  Goals: Select goal  Pt Will Roll Left: With moderate assist;to maximize functional mobility and independence;5 visits  Pt Will Roll Right: With moderate assist;to maximize functional mobility and independence;5 visits  Pt Will Go Supine To Sit: With moderate assist;to maximize functional mobility and independence;5 visits;With assist of 2  Pt Will Sit Edge of Bed: 3-5 min;With moderate assist;to maximize functional mobility and independence;5 visits      Discharge Recommendations:   Discharge Recommendation:  (TBD if able to wean from vent)         Precautions and Contraindications: Fall risk       Medical Diagnosis: Respiratory  failure [518.81]  Pneumonia [486]  Respiratory failure  AKI    History of Present Illness: Adam Simmons is a 48 y.o. male admitted on 04/24/2013 with "respiratory failure. Per ER report, patient had approx. 1 week history of shortness of breath and URI symptoms.  He had worsening shortness of breath and required intubation. Patient arrived to ICU with high ventilatory settings.  He was also hypotensive despite aggressive fluid resuscitation.  He had documented fevers" per H&P on 04/24/13.    Patient Active Problem List   Diagnosis   . Respiratory failure   . Pneumonia   . Hypokalemia   . ARDS (adult respiratory distress syndrome)   . DM (diabetes mellitus)   . AKI (acute kidney injury)        Past Medical/Surgical History:  Past Medical History   Diagnosis Date   . Hypertension    . Diabetes mellitus    . Kidney stones       Past Surgical History   Procedure Date   . Kidney stone surgery      Social History:  Prior Level of Function  Prior level of function: Independent with ADLs;Ambulates independently  Baseline Activity Level: Community ambulation  Driving: independent  Home Living Arrangements  Living Arrangements: Spouse/significant other  Type of Home: House  Home Layout: One level;Performs ADL's on one level;Ramped entrance (1 STE)  Home Living - Notes / Comments: Per CM notes      Subjective:    Nursing clears patient for therapy. Pt remains intubated but sedation has been turned off for SBT. Currently still on PRVC.  Objective:   Observation of Patient/Vital Signs:  Patient is in bed with telemetry, peripheral IV, a line L radial, mechanical ventilation via oral intubation and indwelling urinary catheter in place.    Cognition  Arousal/Alertness: Inconsistent responses to stimuli  Attention Span: Difficulty attending to directions  Orientation Level: Oriented to person (Opened eyes to name throughout session)  Following Commands: Does not follow commands  Insights: Not aware of deficits  Neuro  Status  Behavior: calm;cooperative    (-)deep pressure/pain response BLE    Gross ROM  Right Lower Extremity ROM: within functional limits  Left Lower Extremity ROM: within functional limits  Gross Strength  Right Lower Extremity Strength: unable to assess due to poor command following.  Left Lower Extremity Strength: unable to assess       Functional Mobility  Rolling: Dependent (Rolled B for pericare.)    Participation and Endurance  Participation Effort: poor  Endurance: Tolerates 10 - 20 min exercise with multiple rests. RR mid to upper 30s at rest. Increased into 40s, 49 highest, during rolling. Coughing at times. Recovered quickly with rest.    Treatment Activities: PROM as below:  Hip/knee flexion Bx5  Hip abd/add Bx5  Ankle DF/PF Bx5    Heel cord stretch B x30 sec    Assisted RN with rolling and pericare. Bed placed in reverse trendelenburg to aid in breathing with sedation off.     Educated the patient to role of physical therapy, plan of care, goals of therapy and safety with mobility and ADLs.    At end of session pt supine in bed, all medical equipment intact. RN aware and in room.      Delfin Edis, PT, DPT  Pager #: [phone]

## 2013-05-02 LAB — MAGNESIUM: Magnesium: 2.1 mg/dL (ref 1.6–2.6)

## 2013-05-02 LAB — CBC AND DIFFERENTIAL
Hematocrit: 38.1 % — ABNORMAL LOW (ref 42.0–52.0)
Hgb: 12 g/dL — ABNORMAL LOW (ref 13.0–17.0)
MCH: 28.4 pg (ref 28.0–32.0)
MCHC: 31.5 g/dL — ABNORMAL LOW (ref 32.0–36.0)
MCV: 90.3 fL (ref 80.0–100.0)
MPV: 10.6 fL (ref 9.4–12.3)
Platelets: 275 10*3/uL (ref 140–400)
RBC: 4.22 10*6/uL — ABNORMAL LOW (ref 4.70–6.00)
RDW: 16 % — ABNORMAL HIGH (ref 12–15)
WBC: 23.3 10*3/uL — ABNORMAL HIGH (ref 3.50–10.80)

## 2013-05-02 LAB — COMPREHENSIVE METABOLIC PANEL
ALT: 37 U/L (ref 0–55)
ALT: 33 U/L (ref 0–55)
AST (SGOT): 29 U/L (ref 5–34)
AST (SGOT): 29 U/L (ref 5–34)
Albumin/Globulin Ratio: 0.5 — ABNORMAL LOW (ref 0.9–2.2)
Albumin/Globulin Ratio: 0.4 — ABNORMAL LOW (ref 0.9–2.2)
Albumin: 2 g/dL — ABNORMAL LOW (ref 3.5–5.0)
Albumin: 1.8 g/dL — ABNORMAL LOW (ref 3.5–5.0)
Alkaline Phosphatase: 96 U/L (ref 40–150)
Alkaline Phosphatase: 85 U/L (ref 40–150)
Anion Gap: 14 (ref 5.0–15.0)
Anion Gap: 15 (ref 5.0–15.0)
BUN: 66 mg/dL — ABNORMAL HIGH (ref 9.0–21.0)
BUN: 70.2 mg/dL — ABNORMAL HIGH (ref 9.0–21.0)
Bilirubin, Total: 0.5 mg/dL (ref 0.2–1.2)
Bilirubin, Total: 0.4 mg/dL (ref 0.2–1.2)
CO2: 28 mEq/L (ref 22–29)
CO2: 26 mEq/L (ref 22–29)
Calcium: 8.4 mg/dL — ABNORMAL LOW (ref 8.5–10.5)
Calcium: 8.1 mg/dL — ABNORMAL LOW (ref 8.5–10.5)
Chloride: 104 mEq/L (ref 98–107)
Chloride: 104 mEq/L (ref 98–107)
Creatinine: 2.1 mg/dL — ABNORMAL HIGH (ref 0.7–1.3)
Creatinine: 2.1 mg/dL — ABNORMAL HIGH (ref 0.7–1.3)
Globulin: 4.4 g/dL — ABNORMAL HIGH (ref 2.0–3.6)
Globulin: 4.6 g/dL — ABNORMAL HIGH (ref 2.0–3.6)
Glucose: 218 mg/dL — ABNORMAL HIGH (ref 70–100)
Glucose: 205 mg/dL — ABNORMAL HIGH (ref 70–100)
Potassium: 4.6 mEq/L (ref 3.5–5.1)
Potassium: 4.5 mEq/L (ref 3.5–5.1)
Protein, Total: 6.4 g/dL (ref 6.0–8.3)
Protein, Total: 6.4 g/dL (ref 6.0–8.3)
Sodium: 146 mEq/L — ABNORMAL HIGH (ref 136–145)
Sodium: 145 mEq/L (ref 136–145)

## 2013-05-02 LAB — MAN DIFF ONLY
Band Neutrophils Absolute: 1.4 10*3/uL — ABNORMAL HIGH (ref 0.00–1.00)
Band Neutrophils: 6 %
Basophils Absolute Manual: 0 10*3/uL (ref 0.00–0.20)
Basophils Manual: 0 %
Eosinophils Absolute Manual: 0.47 10*3/uL (ref 0.00–0.70)
Eosinophils Manual: 2 %
Lymphocytes Absolute Manual: 1.4 10*3/uL (ref 0.50–4.40)
Lymphocytes Manual: 6 %
Monocytes Absolute: 1.4 10*3/uL — ABNORMAL HIGH (ref 0.00–1.20)
Monocytes Manual: 6 %
Neutrophils Absolute Manual: 18.64 10*3/uL — ABNORMAL HIGH (ref 1.80–8.10)
Nucleated RBC: 0 (ref 0–1)
Segmented Neutrophils: 80 %

## 2013-05-02 LAB — CELL MORPHOLOGY
Cell Morphology: ABNORMAL — AB
Platelet Estimate: NORMAL

## 2013-05-02 LAB — GLUCOSE WHOLE BLOOD - POCT
Whole Blood Glucose POCT: 173 mg/dL — ABNORMAL HIGH (ref 70–100)
Whole Blood Glucose POCT: 175 mg/dL — ABNORMAL HIGH (ref 70–100)
Whole Blood Glucose POCT: 176 mg/dL — ABNORMAL HIGH (ref 70–100)
Whole Blood Glucose POCT: 198 mg/dL — ABNORMAL HIGH (ref 70–100)
Whole Blood Glucose POCT: 198 mg/dL — ABNORMAL HIGH (ref 70–100)
Whole Blood Glucose POCT: 207 mg/dL — ABNORMAL HIGH (ref 70–100)
Whole Blood Glucose POCT: 226 mg/dL — ABNORMAL HIGH (ref 70–100)

## 2013-05-02 LAB — PHOSPHORUS: Phosphorus: 6.9 mg/dL — ABNORMAL HIGH (ref 2.3–4.7)

## 2013-05-02 LAB — GFR
EGFR: 33.7
EGFR: 33.7

## 2013-05-02 MED ORDER — ENOXAPARIN SODIUM 30 MG/0.3ML SC SOLN
30.0000 mg | Freq: Every day | SUBCUTANEOUS | Status: DC
Start: 2013-05-03 — End: 2013-05-10
  Administered 2013-05-03 – 2013-05-09 (×7): 30 mg via SUBCUTANEOUS
  Filled 2013-05-02 (×7): qty 0.3

## 2013-05-02 MED ORDER — BUMETANIDE 0.25 MG/ML IJ SOLN
1.0000 mg | Freq: Once | INTRAMUSCULAR | Status: AC
Start: 2013-05-02 — End: 2013-05-02
  Administered 2013-05-02: 1 mg via INTRAVENOUS
  Filled 2013-05-02: qty 10

## 2013-05-02 NOTE — Progress Notes (Signed)
Infectious Disease            Progress Note    05/02/2013   Adam Simmons ZOX:[id number] is a 48 y.o. male, history of hypertension, diabetes mellitus, kidney stones. Admitted with septic shock, pneumonia, respiratory failure.    Subjective:     Adam Simmons today Symptoms: Afebrile, does not require any more dialysis, C. Difficile, negative.  Improving  kidney functions , still leukocytosis on steroids.    Objective:     Blood pressure 146/75, pulse 98, temperature 98 F (36.7 C), temperature source Foley Temp, resp. rate 30, height 1.727 m (5\' 8" ), weight 124.6 kg (274 lb 11.1 oz), SpO2 93.00%.    General Appearance: Intubated and sedated.    HEENT: Pallor negative, Anicteric sclera.  Intubated  Lungs:  Bilateral decreased breath sounds  Heart: Normal rate.  S1 and S2.    Chest Wall: Symmetric chest wall expansion.   Abdomen: Abdomen is soft, scaphoid and non-distended. There are no signs of ascites. Bowel sounds are normal. Rectal tube in place  Neurological: Patient is intubated and sedated    Laboratory And Diagnostic Studies:     Recent Labs   [facility] 05/02/13 0529 05/01/13 0345    WBC 23.30* 17.91*    HGB 12.0* 11.5*    HCT 38.1* 36.3*    PLT 275 223    NEUTRO 80 73     Recent Labs   Basename 05/02/13 0529 05/01/13 0345    NA 146* 147*    K 4.6 4.3    CL 104 106    CO2 28 27    BUN 66.0* 67.2*    CREAT 2.1* 2.2*    GLU 218* 178*    CA 8.4* 8.4*       Blood cultures:  Strep pneumoniae  C. Difficile: negative      Current Med's:     Current Facility-Administered Medications   Medication Dose Route Frequency   . albuterol-ipratropium  3 mL Nebulization Q4H SCH   . amLODIPine  5 mg per OG tube Daily   . chlorhexidine  15 mL Mouth/Throat BID   . docusate  100 mg Oral BID   . enoxaparin  40 mg Subcutaneous Daily   . ertapenem  1,000 mg Intravenous Q24H SCH   . famotidine  20 mg per OG tube BID   . insulin aspart  1-12 Units Subcutaneous Q4H   . insulin glargine  30 Units Subcutaneous QHS   .  lactobacillus/streoptococcus  1 capsule Oral Daily   . levofloxacin  750 mg Intravenous Q48H   . methylprednisolone  20 mg Intravenous QAM   . nystatin  1 application Topical BID   . polyethylene glycol  17 g Oral BID   . thiamine  100 mg Oral Daily       Line, Drains, Airways:     NG/OG Tube Orogastric 16 Fr. Center mouth (Active)   Number of days:8       Urethral Catheter Non-latex 16 Fr. (Active)   Number of days:8         Arterial Line 04/24/13 Left Radial (Active)   Number of days:8       CVC Triple Lumen 04/24/13 Left Internal jugular (Active)   Number of days:8       Permacath/Temporary Catheter 04/27/13 Temporary Catheter Right (Active)   Number of days:5         Assessment:      Condition.  Guarded   Septic shock; Hoff presents  Strep pneumoniae sepsis     Respiratory failure, status post intubation   Bilateral pneumonia   Possible aspiration   Diabetes mellitus   Acute kidney injury   Thrombocytopenia; resolved   Elevated liver function tests; improved    Plan:      Continue  Invanz   Continue Levaquin   Continue ventilatory support   Continue supportive care   Correction of electrolytes   Nephrology follow up   Discussed with patient's wife          Alfonzo Beers, M.D.,FACP  05/02/2013  11:52 AM

## 2013-05-02 NOTE — Progress Notes (Signed)
[facility] [employer].  Progress Note    Assessment:  1.AKI- non-oliguric   2. Pneumonia with ARDS- on ventilator  3. Septic shock on pressors  4. Severe acidosis - combined respiratory and metabolic  5. DM on Metformin  6. HTN  7. Kidney stones  8. Exposure to NSAIDs prior to admission  9. Hypernatremia    Plan:  1.Ventilator support  2. Hemodynamic support - off pressors  3. IV fluids - 1/2 Saline  4. Daily labs  5. Adjust meds based on GFR < 30  6. Requested a Quinton catheter 04/27/13.  7. PEG feeding with water flushes- 300 cc q 4 hours  8. Dialysis 2/8  9. D/C Bumex  10. Hold off on dialysis .  11. Can d/c Quinton catheter.    Herbie Drape, MD  Office - [phone]  ++++++++++++++++++++++++++++++++++++++++++++++++++++++++++++++  Subjective:  Intubated on vent    Medications:  Scheduled Meds:  Current Facility-Administered Medications   Medication Dose Route Frequency   . albuterol-ipratropium  3 mL Nebulization Q4H SCH   . amLODIPine  5 mg per OG tube Daily   . chlorhexidine  15 mL Mouth/Throat BID   . docusate  100 mg Oral BID   . enoxaparin  40 mg Subcutaneous Daily   . ertapenem  1,000 mg Intravenous Q24H SCH   . famotidine  20 mg per OG tube BID   . insulin aspart  1-12 Units Subcutaneous Q4H   . insulin glargine  30 Units Subcutaneous QHS   . lactobacillus/streoptococcus  1 capsule Oral Daily   . levofloxacin  750 mg Intravenous Q48H   . methylprednisolone  20 mg Intravenous QAM   . nystatin  1 application Topical BID   . polyethylene glycol  17 g Oral BID   . thiamine  100 mg Oral Daily     Continuous Infusions:       . sodium chloride 50 mL/hr at 05/01/13 2313   . midazolam Stopped (04/30/13 1755)   . phenylephrine Stopped (04/28/13 0100)   . propofol 30 mcg/kg/min (05/02/13 0807)     PRN Meds:acetaminophen, albuterol, dextrose, dextrose, glucagon (rDNA), midazolam, morphine, vecuronium, zinc Oxide    Objective:  Vital signs in last 24 hours:  Temp:  [98 F (36.7 C)] 98 F  (36.7 C)  Heart Rate:  [98-114] 98   Resp Rate:  [30] 30   BP: (146-166)/(77-89) 155/84 mmHg  Arterial Line BP: (133-162)/(64-79) 137/64 mmHg  FiO2:  [50 %] 50 %    Intake/Output from yesterday (07:01 - 07:00):  02/10 0701 - 02/11 0700  In: 5105.27 [I.V.:1718.37]  Out: 6075 [Urine:6075]     Physical Exam:   Gen: WD WN NAD   CV: S1 S2 N RRR   Chest: CTAB   Ab: ND NT soft no HSM +BS   Ext: No C/E    Labs:    Lab 05/02/13 0529 05/01/13 0345 04/30/13 0309   GLU 218* 178* 231*   BUN 66.0* 67.2* 73.9*   CREAT 2.1* 2.2* 2.3*   CA 8.4* 8.4* 8.6   NA 146* 147* 147*   K 4.6 4.3 3.9   CL 104 106 107   CO2 28 27 25    ALB 2.0* 1.9* 1.9*   PHOS 6.9* 5.6* 4.8*   MG 2.1 2.1 2.0       Lab 05/02/13 0529 05/01/13 0345 04/30/13 0309   WBC 23.30* 17.91* 17.10*   HGB 12.0* 11.5* 11.4*   HCT 38.1* 36.3*  35.7*   MCV 90.3 89.0 89.5   MCH 28.4 28.2 28.6   MCHC 31.5* 31.7* 31.9*   RDW 16* 16* 16*   MPV 10.6 10.0 10.6   PLT 275 223 178

## 2013-05-02 NOTE — Progress Notes (Signed)
[facility]- Critical Care Note     ICU Daily Progress Note        Date Time: 05/02/2013 10:32 PM  Patient Name: Adam Simmons  Attending Physician: Lawernce Ion, MD  Room: IC05/IC05-A   Admit Date: 04/24/2013  LOS: 8 days            Assessment:     Patient Active Problem List   Diagnosis   . Respiratory failure   . Pneumonia   . Hypokalemia   . ARDS (adult respiratory distress syndrome)   . DM (diabetes mellitus)   . AKI (acute kidney injury)   Renal function improved, diuresing, no HD  Vent day 8  Pneumococcal pneumonia / sepsis / bacteremia.  Leukocytosis, tachypnea, fever today.  hyperglycemia    Plan:   Ertapenem, levaquin per ID  Reculture.  Remove Quinton if no further dialysis needed.  Insulin prn    Subjective:   sedated    Medications:       Scheduled Meds: PRN Meds:         albuterol-ipratropium 3 mL Nebulization Q4H SCH   amLODIPine 5 mg per OG tube Daily   [COMPLETED] bumetanide 1 mg Intravenous Once   chlorhexidine 15 mL Mouth/Throat BID   docusate 100 mg Oral BID   enoxaparin 40 mg Subcutaneous Daily   ertapenem 1,000 mg Intravenous Q24H SCH   famotidine 20 mg per OG tube BID   insulin aspart 1-12 Units Subcutaneous Q4H   insulin glargine 30 Units Subcutaneous QHS   lactobacillus/streoptococcus 1 capsule Oral Daily   levofloxacin 750 mg Intravenous Q48H   methylprednisolone 20 mg Intravenous QAM   nystatin 1 application Topical BID   polyethylene glycol 17 g Oral BID   thiamine 100 mg Oral Daily       Continuous Infusions:       . midazolam Stopped (04/30/13 1755)   . phenylephrine Stopped (04/28/13 0100)   . propofol 50 mcg/kg/min (05/02/13 2047)   . [DISCONTINUED] sodium chloride Stopped (05/02/13 1347)         acetaminophen 500 mg Q4H PRN   albuterol 2.5 mg Q1H PRN   dextrose 15 g PRN   dextrose 25 mL PRN   glucagon (rDNA) 1 mg PRN   midazolam 2 mg Q1H PRN   morphine 2 mg Q2H PRN   vecuronium 10 mg Q6H PRN   zinc Oxide  PRN             Physical Exam:     Filed Vitals:    05/02/13 2000  05/02/13 2005 05/02/13 2100 05/02/13 2200   BP: 135/64  135/72 118/62   Pulse: 104  107 101   Temp:       TempSrc:       Resp: 34  36 35   Height:       Weight:       SpO2: 92% 93% 93% 93%     No data recorded.           02/10 0701 - 02/11 0700  In: 5105.27 [I.V.:1718.37]  Out: 6075 [Urine:6075]       General Appearance: obese, on vent. sedated  Mental status: sedated  Neuro:  Moves ext with stim  H & N: no jvd  Lungs: rales bilat  Cardiac: reg  Abdomen:  soft  Extremities: + edema  Skin: warm,       Data:       Vent Settings:    Vent Settings  Vent  Mode: PRVC  FiO2: 50 %  Resp Rate (Set): 20   Vt (Set, mL): 400 mL  PIP Observed (cm H2O): 22 cm H2O  PEEP/EPAP: 5 cm H20  Pressure Support / IPAP: 10 cmH20  Mean Airway Pressure: 12 cmH20      Labs:     Recent CBC   Lab 05/02/13 0529 05/01/13 0345 04/30/13 0309   WBC 23.30* 17.91* 17.10*   RBC 4.22* 4.08* 3.99*   HGB 12.0* 11.5* 11.4*   HCT 38.1* 36.3* 35.7*   MCV 90.3 89.0 89.5   PLT 275 223 178         Lab 05/02/13 0529 05/01/13 0345 04/30/13 0309   NA 146* 147* 147*   K 4.6 4.3 3.9   CL 104 106 107   CO2 28 27 25    GLU 218* 178* 231*   BUN 66.0* 67.2* 73.9*   CREAT 2.1* 2.2* 2.3*   MG 2.1 2.1 2.0   PHOS 6.9* 5.6* 4.8*   AST 29 20 21    ALT 37 40 52   ALKPHOS 96 82 81   BILITOTAL 0.5 0.5 0.5   BILIDIRECT -- -- --   LIP -- -- --   BNP -- -- --   PT -- -- --   INR -- -- --   PTT -- -- --   DDIMER -- -- --   CK -- -- --   CKMB -- -- --   TROPI -- -- --   MYOGLOBIN -- -- --           Rads:     Radiology Results (24 Hour)     ** No Results found for the last 24 hours. **          Radiological Imaging personally reviewed.    I have personally reviewed the patient's history and 24 hour interval events, along with vitals, labs, radiology images and  ventilator settings and additional findings found in detail within ICU team notes, with their care plans developed with and reviewed by me.         Signed by: Durward Fortes, MD  Date/Time: 05/02/2013 10:32 PM

## 2013-05-03 ENCOUNTER — Inpatient Hospital Stay: Payer: BC Managed Care – PPO

## 2013-05-03 LAB — GLUCOSE WHOLE BLOOD - POCT
Whole Blood Glucose POCT: 153 mg/dL — ABNORMAL HIGH (ref 70–100)
Whole Blood Glucose POCT: 162 mg/dL — ABNORMAL HIGH (ref 70–100)
Whole Blood Glucose POCT: 207 mg/dL — ABNORMAL HIGH (ref 70–100)
Whole Blood Glucose POCT: 209 mg/dL — ABNORMAL HIGH (ref 70–100)
Whole Blood Glucose POCT: 220 mg/dL — ABNORMAL HIGH (ref 70–100)

## 2013-05-03 LAB — MAN DIFF ONLY
Band Neutrophils Absolute: 2.93 10*3/uL — ABNORMAL HIGH (ref 0.00–1.00)
Band Neutrophils: 13 %
Basophils Absolute Manual: 0 10*3/uL (ref 0.00–0.20)
Basophils Manual: 0 %
Eosinophils Absolute Manual: 0.23 10*3/uL (ref 0.00–0.70)
Eosinophils Manual: 1 %
Lymphocytes Absolute Manual: 1.35 10*3/uL (ref 0.50–4.40)
Lymphocytes Manual: 6 %
Monocytes Absolute: 1.58 10*3/uL — ABNORMAL HIGH (ref 0.00–1.20)
Monocytes Manual: 7 %
Neutrophils Absolute Manual: 16.43 10*3/uL — ABNORMAL HIGH (ref 1.80–8.10)
Nucleated RBC: 0 (ref 0–1)
Segmented Neutrophils: 73 %

## 2013-05-03 LAB — CBC AND DIFFERENTIAL
Hematocrit: 35.5 % — ABNORMAL LOW (ref 42.0–52.0)
Hgb: 11.2 g/dL — ABNORMAL LOW (ref 13.0–17.0)
MCH: 28.4 pg (ref 28.0–32.0)
MCHC: 31.5 g/dL — ABNORMAL LOW (ref 32.0–36.0)
MCV: 89.9 fL (ref 80.0–100.0)
MPV: 10.5 fL (ref 9.4–12.3)
Platelets: 281 10*3/uL (ref 140–400)
RBC: 3.95 10*6/uL — ABNORMAL LOW (ref 4.70–6.00)
RDW: 16 % — ABNORMAL HIGH (ref 12–15)
WBC: 22.5 10*3/uL — ABNORMAL HIGH (ref 3.50–10.80)

## 2013-05-03 LAB — COMPREHENSIVE METABOLIC PANEL
ALT: 30 U/L (ref 0–55)
AST (SGOT): 26 U/L (ref 5–34)
Albumin/Globulin Ratio: 0.4 — ABNORMAL LOW (ref 0.9–2.2)
Albumin: 1.9 g/dL — ABNORMAL LOW (ref 3.5–5.0)
Alkaline Phosphatase: 89 U/L (ref 40–150)
Anion Gap: 14 (ref 5.0–15.0)
BUN: 74.3 mg/dL — ABNORMAL HIGH (ref 9.0–21.0)
Bilirubin, Total: 0.4 mg/dL (ref 0.2–1.2)
CO2: 26 mEq/L (ref 22–29)
Calcium: 8.1 mg/dL — ABNORMAL LOW (ref 8.5–10.5)
Chloride: 105 mEq/L (ref 98–107)
Creatinine: 2.3 mg/dL — ABNORMAL HIGH (ref 0.7–1.3)
Globulin: 4.3 g/dL — ABNORMAL HIGH (ref 2.0–3.6)
Glucose: 210 mg/dL — ABNORMAL HIGH (ref 70–100)
Potassium: 4.2 mEq/L (ref 3.5–5.1)
Protein, Total: 6.2 g/dL (ref 6.0–8.3)
Sodium: 145 mEq/L (ref 136–145)

## 2013-05-03 LAB — PHOSPHORUS: Phosphorus: 6.9 mg/dL — ABNORMAL HIGH (ref 2.3–4.7)

## 2013-05-03 LAB — CELL MORPHOLOGY
Cell Morphology: ABNORMAL — AB
Platelet Estimate: NORMAL

## 2013-05-03 LAB — MAGNESIUM: Magnesium: 2.1 mg/dL (ref 1.6–2.6)

## 2013-05-03 LAB — GFR: EGFR: 30.4

## 2013-05-03 MED ORDER — INSULIN GLARGINE 100 UNIT/ML SC SOLN
15.0000 [IU] | Freq: Every evening | SUBCUTANEOUS | Status: DC
Start: 2013-05-03 — End: 2013-05-05
  Administered 2013-05-03 – 2013-05-04 (×2): 15 [IU] via SUBCUTANEOUS
  Filled 2013-05-03 (×2): qty 150

## 2013-05-03 MED ORDER — FAMOTIDINE 20MG/2.5ML PO UNIT DOSE SYRINGE
20.0000 mg | Freq: Every day | ORAL | Status: DC
Start: 2013-05-04 — End: 2013-05-10
  Administered 2013-05-04 – 2013-05-09 (×6): 20 mg via OROGASTRIC
  Filled 2013-05-03 (×7): qty 2.5

## 2013-05-03 NOTE — Progress Notes (Signed)
Patient continues with sedation versed, and propofol, with respirations in 30's.  Dr Nedra Hai changed vent settings with 500 TV.  Respirations decreased to 27.

## 2013-05-03 NOTE — Progress Notes (Signed)
Infectious Disease            Progress Note    05/03/2013   Adam Simmons [id number] is a 48 y.o. male, history of hypertension, diabetes mellitus, kidney stones. Admitted with septic shock, pneumonia, respiratory failure.    Subjective:     Adam Simmons today Symptoms: Afebrile. Still leukocytosis on steroids, off pressors. Renal function, stable    Objective:     Blood pressure 108/58, pulse 92, temperature 98 F (36.7 C), temperature source Foley Temp, resp. rate 30, height 1.727 m (5\' 8" ), weight 121.9 kg (268 lb 11.9 oz), SpO2 94.00%.    General Appearance: Intubated and sedated.    HEENT: Pallor negative, Anicteric sclera.  Intubated  Lungs:  Bilateral decreased breath sounds  Heart: Normal rate.  S1 and S2.    Chest Wall: Symmetric chest wall expansion.   Abdomen: Abdomen is soft, scaphoid and non-distended. There are no signs of ascites. Bowel sounds are normal. Rectal tube in place  Neurological: Patient is intubated and sedated    Laboratory And Diagnostic Studies:     Recent Labs   [facility] 05/03/13 0349 05/02/13 0529    WBC 22.50* 23.30*    HGB 11.2* 12.0*    HCT 35.5* 38.1*    PLT 281 275    NEUTRO 73 80     Recent Labs   Basename 05/03/13 0349 05/02/13 2154    NA 145 145    K 4.2 4.5    CL 105 104    CO2 26 26    BUN 74.3* 70.2*    CREAT 2.3* 2.1*    GLU 210* 205*    CA 8.1* 8.1*       Blood cultures:  Strep pneumoniae      Current Med's:     Current Facility-Administered Medications   Medication Dose Route Frequency   . albuterol-ipratropium  3 mL Nebulization Q4H SCH   . amLODIPine  5 mg per OG tube Daily   . [COMPLETED] bumetanide  1 mg Intravenous Once   . chlorhexidine  15 mL Mouth/Throat BID   . docusate  100 mg Oral BID   . enoxaparin  30 mg Subcutaneous Daily   . ertapenem  1,000 mg Intravenous Q24H SCH   . famotidine  20 mg per OG tube BID   . insulin aspart  1-12 Units Subcutaneous Q4H   . insulin glargine  30 Units Subcutaneous QHS   . lactobacillus/streoptococcus   1 capsule Oral Daily   . levofloxacin  750 mg Intravenous Q48H   . methylprednisolone  20 mg Intravenous QAM   . nystatin  1 application Topical BID   . polyethylene glycol  17 g Oral BID   . thiamine  100 mg Oral Daily   . [DISCONTINUED] enoxaparin  40 mg Subcutaneous Daily       Line, Drains, Airways:     NG/OG Tube Orogastric 16 Fr. Center mouth (Active)   Number of days:9       Urethral Catheter Non-latex 16 Fr. (Active)   Number of days:9             CVC Triple Lumen 04/24/13 Left Internal jugular (Active)   Number of days:9       Permacath/Temporary Catheter 04/27/13 Temporary Catheter Right (Active)   Number of days:6         Assessment:      Condition.  Guarded   Septic shock; Hoff presents   Strep pneumoniae sepsis  Respiratory failure, status post intubation   Bilateral pneumonia   Possible aspiration   Diabetes mellitus   Acute kidney injury   Thrombocytopenia; resolved   Elevated liver function tests; improved    Plan:      Continue  Invanz   Continue Levaquin   Continue ventilatory support   Continue supportive care   Correction of electrolytes   Nephrology follow up          Alfonzo Beers, M.D.,FACP  05/03/2013  7:58 AM

## 2013-05-03 NOTE — Plan of Care (Signed)
Problem: Inadequate Tissue Perfusion  Goal: Adequate tissue perfusion will be maintained  Intervention: Monitor/assess vital signs  Patient persistently breathing in the mid to upper 30's with tmax 101.5. Notified Dr. Lesle Reek. Orders for BC's. Cultures sent. Tylenol given and patient packed with ice packs. Temp now 98.6, and RR's 29-31.

## 2013-05-03 NOTE — OT Progress Note (Signed)
[facility]  [address]  [place], [place]. [address]    Department of Rehabilitation Services  [phone]    Occupational Therapy Treatment Note       Patient:  Salam Micucci MRN#:  [id number]  INTENSIVE CARE [place] IC05/IC05-A    Time of treatment: Start Time: 1123 Stop Time: 1144   Time Calculation (min): 21 min         Precautions and Contraindications:  Falls Risk; Intubated       Assessment:Patient presents with: decreased ROM;decreased strength;balance deficits;decreased independence with ADLs;decreased safety awareness;decreased attention;decreased cognition. No command following today, Patient remains sedated. Patient's RR high 20's/low 30's during the session. B/L UE edema present. No progress made towards OT goals to date therefore will decreased Patient's frequency to monitor status to see if Patient able to increase participation.   Prognosis: Patient has multiple medical complication;Patient remains in ICU/critical care  Progress: Slow progress, medical status limitations    Patient Goal  Patient Goal:  (No stated goals at this time; Patient intubated and sedated)      Plan: Continue with Occupational therapy services in acute care to address increasing independence and safety with ADL's.. Focus next therapy session on increasing AROM B/L UEs; EOB sitting in prep for UB ADL's (as appropriate).      OT Plan  Treatment Interventions: UE strengthening/ROM;Patient/Family training  Discharge Recommendation:  (TBD based upon Patient's ability to be weaned from vent)  OT Frequency Recommended: monitor status (as no progress made from OT Evaluation on 05/01/13.)  OT - Next Visit Recommendation: 05/07/13                                      Discharge Recommendation:  (TBD based upon Patient's ability to be weaned from vent)           Subjective: Patient's medical condition is appropriate for Occupational Therapy intervention at this time.  Patient is unable to indicate agreement for the therapy  session but is able to participate in the selected activities. Nursing clears patient for therapy. RN Cecile reports Patient's sedation will not be lowered today due to increased RR.       Objective:Observation of Patient/Vital Signs:  Patient is in bed with telemetry, central line, OG tube,mechanical ventilation via oral intubation and indwelling urinary catheter in place.    Cognition  Arousal/Alertness: Unresponsive to stimuli (No response to painful stimuli B/L hands)  Attention Span:  (Not able to follow commands)  Insights: Not aware of deficits         Self-care and Home Management  Eating:  (Dependent at present time due to Patient intubated and sedated)    Therapeutic Exercises:Therapeutic Exercises:Patient guided thru and provided with the following UB ROM exercises to faciltate increased endurance and strength for ADL's:  Shoulder PROM: Flexion;Extension 1 x 5 reps  Shoulder PROM; Horizontal Abd/Adduction 1 x 5 reps  Elbow PROM: Flexion;Extension 1 x 5 reps  Wrist PROM: Flexion;Extension 1 x 5 reps  Digits PROM Flexion/Extension 1 x 5 reps                               Treatment Activities: Educated the patient to role of occupational therapy, plan of care, goals of therapy.    Patient left without needs and call bell within reach. .  RN notified of session outcome.  Hewitt Blade. Rigoberto Noel, MS,OTR/L  Pager # [phone]  [phone]

## 2013-05-03 NOTE — Progress Notes (Signed)
[facility], [employer].  Progress Note    Assessment:  1.AKI- non-oliguric   2. Pneumonia with ARDS- on ventilator  3. Septic shock on pressors  4. Severe acidosis - combined respiratory and metabolic  5. DM on Metformin  6. HTN  7. Kidney stones  8. Exposure to NSAIDs prior to admission  9. Hypernatremia    Plan:  1.Ventilator support  2. Hemodynamic support - off pressors  3. IV fluids - KVO  4. Daily labs  5. Adjust meds based on GFR < 30  6. Requested a Quinton catheter 04/27/13.  7. PEG feeding with water flushes- 300 cc q 4 hours  8. Dialysis 2/8  9. D/C Bumex- do not think he needs active diuresis - he is putting out > 3.5 litres of urine  10. Hold off on dialysis .  11. Can d/c Quinton catheter.    Adam Drape, MD  Office - [phone]  ++++++++++++++++++++++++++++++++++++++++++++++++++++++++++++++  Subjective:  Intubated on vent    Medications:  Scheduled Meds:  Current Facility-Administered Medications   Medication Dose Route Frequency   . albuterol-ipratropium  3 mL Nebulization Q4H SCH   . amLODIPine  5 mg per OG tube Daily   . chlorhexidine  15 mL Mouth/Throat BID   . docusate  100 mg Oral BID   . enoxaparin  30 mg Subcutaneous Daily   . ertapenem  1,000 mg Intravenous Q24H SCH   . [START ON 05/04/2013] famotidine  20 mg per OG tube Daily   . insulin aspart  1-12 Units Subcutaneous Q4H   . insulin glargine  15 Units Subcutaneous QHS   . lactobacillus/streoptococcus  1 capsule Oral Daily   . levofloxacin  750 mg Intravenous Q48H   . methylprednisolone  20 mg Intravenous QAM   . nystatin  1 application Topical BID   . polyethylene glycol  17 g Oral BID   . thiamine  100 mg Oral Daily   . [DISCONTINUED] enoxaparin  40 mg Subcutaneous Daily   . [DISCONTINUED] famotidine  20 mg per OG tube BID   . [DISCONTINUED] insulin glargine  30 Units Subcutaneous QHS     Continuous Infusions:       . midazolam 8 mg/hr (05/03/13 0733)   . propofol 30 mcg/kg/min (05/03/13 1330)   . [DISCONTINUED]  phenylephrine Stopped (04/28/13 0100)     PRN Meds:acetaminophen, albuterol, dextrose, dextrose, glucagon (rDNA), midazolam, morphine, vecuronium, zinc Oxide    Objective:  Vital signs in last 24 hours:  Temp:  [98.7 F (37.1 C)] 98.7 F (37.1 C)  Heart Rate:  [89-113] 92   Resp Rate:  [25-36] 30   BP: (92-149)/(46-77) 106/55 mmHg  Arterial Line BP: (139-144)/(70-71) 139/70 mmHg  FiO2:  [50 %] 50 %    Intake/Output from yesterday (07:01 - 07:00):  02/11 0701 - 02/12 0700  In: 3146.88 [I.V.:924.88]  Out: 3675 [Urine:3675]     Physical Exam:   Gen: WD WN NAD   CV: S1 S2 N RRR   Chest: CTAB   Ab: ND NT soft no HSM +BS   Ext: No C/E    Labs:    Lab 05/03/13 0349 05/02/13 2154 05/02/13 0529 05/01/13 0345   GLU 210* 205* 218* --   BUN 74.3* 70.2* 66.0* --   CREAT 2.3* 2.1* 2.1* --   CA 8.1* 8.1* 8.4* --   NA 145 145 146* --   K 4.2 4.5 4.6 --   CL 105 104 104 --  CO2 26 26 28  --   ALB 1.9* 1.8* 2.0* --   PHOS 6.9* -- 6.9* 5.6*   MG 2.1 -- 2.1 2.1       Lab 05/03/13 0349 05/02/13 0529 05/01/13 0345   WBC 22.50* 23.30* 17.91*   HGB 11.2* 12.0* 11.5*   HCT 35.5* 38.1* 36.3*   MCV 89.9 90.3 89.0   MCH 28.4 28.4 28.2   MCHC 31.5* 31.5* 31.7*   RDW 16* 16* 16*   MPV 10.5 10.6 10.0   PLT 281 275 223

## 2013-05-03 NOTE — Progress Notes (Signed)
[facility]- Critical Care Note     ICU Daily Progress Note        Date Time: 05/03/2013 10:47 AM  Patient Name: Adam Simmons  Attending Physician: Lawernce Ion, MD  Room: IC05/IC05-A   Admit Date: 04/24/2013  LOS: 9 days        Assessment/Plan:   # Neurologic: sedated, not candidate for sedation vacation given tachypnea. Critical care myopathy.     # Respiratory: multilobar Strep Pneumonia pneumonia, resolved ARDS.  Will adjust ventilator settings, allow liberation of tidal volume.      # Nutrition: appreciate nutritionist recommendation, cont tube feeds    # Endocrine: Diabetes; controlled, cont high dose sliding-scale insulin and concern for possible insulin stacking given worsen kidney injury will decrease to lantus 15units in pm.     # Cardiac: septic shock resolved. Off pressors. Echo indicated EF 50%    # Hematologic: Thrombocytopenia most likely from underlying sepsis     #renal: acute kidney injury, appreciate Dr. Cleophus Molt recs.  No emergent need for HD today. Last dialysis 2/8.    # Infectious diseases: streptococcus pneumonia bacteremia/ pneumonia.  Appreciate Dr. Myrtis Ser consultation and evaluation. Continue antibiotics    Code Status: full code     I have personally reviewed the patient's history and 24 hour interval events, along with vitals, labs, radiology images, and nurses report.         Subjective:     48 y.o. male h/o DM type 2, HTN who presents to the hospital with respiratory failure, from streptococcus pneumonia, ARDS, AKI and septic shock.       tachypnic on ventilator.    Hospital Course  2/3   ARDS / PNA / Sepsis  w/ resp failure- intubated,  Flu test-neg  Resp c+s= gm + cocci , x2BC= gm + cocci resembling strep  2/6 [place] placed for worsening creatinine, 14 L positive  2/8 dialysis  2/10 failed SBT  2/11 tachypnic, readjusted ventilator tidal volume.     Medications:   Scheduled Meds:  Current Facility-Administered Medications   Medication Dose Route Frequency   .  albuterol-ipratropium  3 mL Nebulization Q4H SCH   . amLODIPine  5 mg per OG tube Daily   . [COMPLETED] bumetanide  1 mg Intravenous Once   . chlorhexidine  15 mL Mouth/Throat BID   . docusate  100 mg Oral BID   . enoxaparin  30 mg Subcutaneous Daily   . ertapenem  1,000 mg Intravenous Q24H SCH   . famotidine  20 mg per OG tube BID   . insulin aspart  1-12 Units Subcutaneous Q4H   . insulin glargine  30 Units Subcutaneous QHS   . lactobacillus/streoptococcus  1 capsule Oral Daily   . levofloxacin  750 mg Intravenous Q48H   . methylprednisolone  20 mg Intravenous QAM   . nystatin  1 application Topical BID   . polyethylene glycol  17 g Oral BID   . thiamine  100 mg Oral Daily   . [DISCONTINUED] enoxaparin  40 mg Subcutaneous Daily         Continuous Infusions:       . midazolam 8 mg/hr (05/03/13 0733)   . phenylephrine Stopped (04/28/13 0100)   . propofol 30 mcg/kg/min (05/03/13 0917)   . [DISCONTINUED] sodium chloride Stopped (05/02/13 1347)          Physical Exam:     Filed Vitals:    05/03/13 0800   BP: 106/55   Pulse: 92  Temp:    Resp:    SpO2: 93%         Intake/Output Summary (Last 24 hours) at 05/03/13 1047  Last data filed at 05/03/13 1000   Gross per 24 hour   Intake 2575.41 ml   Output   3575 ml   Net -999.59 ml     General Appearance: obese, awake, intubated   Mental status: awake  Neuro: awake, diffusely weak, intubated   Neck: supple   Lungs: bilateral rhonchi anteriorly, no wheeze  Cardiac: ns1, ns2, no m/r/g, tachycardic   Abdomen: Obese, soft, nontender, hypoactive bowel sounds   Extremities: trace lower extremity edema   Skin: Has band like abrasion around bilateral ankles, has a triangular mark on right forearm area that is scabbed over, has abrasion on left forearm, has grease on his hands, has abrasions on his sacral area, has various bruising on his upper extremities.  Anasarca improved.  Resolved left lateral foot fluid blister.     Data:       Invasive ICU Hemodynamics:    Invasive  Hemodynamic Monitoring  CVP (mmHg): 22 mmHg  CO (L/min): 10.4 L/min  CI (L/min/m2): 4.3 L/min/m2    Art Line  Arterial Line BP: 139/70 mmHg  Arterial Line MAP (mmHg): 89 mmHg  Arterial Line Location: Left radial  Art Line Wave Form: Appropriate wave forms      Vent Settings:    Vent Settings  Vent Mode: PRVC  FiO2: 50 %  Resp Rate (Set): 20   Vt (Set, mL): 400 mL  PIP Observed (cm H2O): 22 cm H2O  PEEP/EPAP: 5 cm H20  Pressure Support / IPAP: 10 cmH20  Mean Airway Pressure: 11 cmH20        Labs:         Labs (last 72 hours):  Recent Labs   Basename 05/03/13 0349 05/02/13 0529 05/01/13 0345    WBC 22.50* 23.30* 17.91*    HGB 11.2* 12.0* 11.5*    HCT 35.5* 38.1* 36.3*    LABPLAT -- -- --     No results found for this basename: PT:3,INR:3,PTT:3 in the last 72 hours Recent Labs   Basename 05/03/13 0349 05/02/13 2154 05/02/13 0529 05/01/13 0345    NA 145 145 146* --    K 4.2 4.5 4.6 --    CL 105 104 104 --    CO2 26 26 28  --    BUN 74.3* 70.2* 66.0* --    CREAT 2.3* 2.1* 2.1* --    GLU 210* 205* 218* --    CA 8.1* 8.1* 8.4* --    MG 2.1 -- 2.1 2.1    PHOS 6.9* -- 6.9* 5.6*                     Rads:   Radiological Imaging personally reviewed,and agree with radiology report including:       CXR  05/03/2013  Multilobar  infiltrate      I have personally reviewed the patient's history and 24 hour interval events, along with vitals, labs, radiology images and nursing.         Signed by: Lawernce Ion, MD  Date/Time: 05/03/2013 10:47 AM

## 2013-05-04 ENCOUNTER — Encounter
Admission: EM | Disposition: A | Discharge status: Skilled Nursing Facility | Payer: Self-pay | Source: Home / Self Care | Attending: Internal Medicine

## 2013-05-04 LAB — COMPREHENSIVE METABOLIC PANEL
ALT: 34 U/L (ref 0–55)
AST (SGOT): 34 U/L (ref 5–34)
Albumin/Globulin Ratio: 0.4 — ABNORMAL LOW (ref 0.9–2.2)
Albumin: 1.9 g/dL — ABNORMAL LOW (ref 3.5–5.0)
Alkaline Phosphatase: 110 U/L (ref 40–150)
Anion Gap: 13 (ref 5.0–15.0)
BUN: 72.6 mg/dL — ABNORMAL HIGH (ref 9.0–21.0)
Bilirubin, Total: 0.5 mg/dL (ref 0.2–1.2)
CO2: 25 mEq/L (ref 22–29)
Calcium: 8.2 mg/dL — ABNORMAL LOW (ref 8.5–10.5)
Chloride: 106 mEq/L (ref 98–107)
Creatinine: 2.1 mg/dL — ABNORMAL HIGH (ref 0.7–1.3)
Globulin: 4.6 g/dL — ABNORMAL HIGH (ref 2.0–3.6)
Glucose: 220 mg/dL — ABNORMAL HIGH (ref 70–100)
Potassium: 4.1 mEq/L (ref 3.5–5.1)
Protein, Total: 6.5 g/dL (ref 6.0–8.3)
Sodium: 144 mEq/L (ref 136–145)

## 2013-05-04 LAB — CBC AND DIFFERENTIAL
Basophils Absolute Automated: 0.04 10*3/uL (ref 0.00–0.20)
Basophils Automated: 0 %
Eosinophils Absolute Automated: 0.25 10*3/uL (ref 0.00–0.70)
Eosinophils Automated: 1 %
Hematocrit: 35.5 % — ABNORMAL LOW (ref 42.0–52.0)
Hgb: 11.1 g/dL — ABNORMAL LOW (ref 13.0–17.0)
Immature Granulocytes Absolute: 0.22 10*3/uL — ABNORMAL HIGH
Immature Granulocytes: 1 %
Lymphocytes Absolute Automated: 1.25 10*3/uL (ref 0.50–4.40)
Lymphocytes Automated: 6 %
MCH: 27.9 pg — ABNORMAL LOW (ref 28.0–32.0)
MCHC: 31.3 g/dL — ABNORMAL LOW (ref 32.0–36.0)
MCV: 89.2 fL (ref 80.0–100.0)
MPV: 10.6 fL (ref 9.4–12.3)
Monocytes Absolute Automated: 1.52 10*3/uL — ABNORMAL HIGH (ref 0.00–1.20)
Monocytes: 7 %
Neutrophils Absolute: 19.82 10*3/uL — ABNORMAL HIGH (ref 1.80–8.10)
Neutrophils: 87 %
Platelets: 313 10*3/uL (ref 140–400)
RBC: 3.98 10*6/uL — ABNORMAL LOW (ref 4.70–6.00)
RDW: 16 % — ABNORMAL HIGH (ref 12–15)
WBC: 22.88 10*3/uL — ABNORMAL HIGH (ref 3.50–10.80)

## 2013-05-04 LAB — CELL MORPHOLOGY
Cell Morphology: ABNORMAL — AB
Platelet Estimate: NORMAL

## 2013-05-04 LAB — GLUCOSE WHOLE BLOOD - POCT
Whole Blood Glucose POCT: 131 mg/dL — ABNORMAL HIGH (ref 70–100)
Whole Blood Glucose POCT: 154 mg/dL — ABNORMAL HIGH (ref 70–100)
Whole Blood Glucose POCT: 186 mg/dL — ABNORMAL HIGH (ref 70–100)
Whole Blood Glucose POCT: 195 mg/dL — ABNORMAL HIGH (ref 70–100)
Whole Blood Glucose POCT: 217 mg/dL — ABNORMAL HIGH (ref 70–100)

## 2013-05-04 LAB — GFR: EGFR: 33.7

## 2013-05-04 LAB — MAGNESIUM: Magnesium: 2.1 mg/dL (ref 1.6–2.6)

## 2013-05-04 LAB — PHOSPHORUS: Phosphorus: 6 mg/dL — ABNORMAL HIGH (ref 2.3–4.7)

## 2013-05-04 SURGERY — PICC LINE PLACEMENT
Site: Chest | Laterality: Left

## 2013-05-04 MED ORDER — SODIUM CHLORIDE 0.9 % IJ SOLN
10.0000 mL | Freq: Every day | INTRAMUSCULAR | Status: DC
Start: 2013-05-04 — End: 2013-05-23
  Administered 2013-05-06 – 2013-05-23 (×16): 10 mL

## 2013-05-04 MED ORDER — LIDOCAINE HCL (PF) 1 % IJ SOLN
60.0000 mg | Freq: Once | INTRAMUSCULAR | Status: AC
Start: 2013-05-04 — End: 2013-05-04
  Administered 2013-05-04: 60 mg via INTRAVENOUS
  Filled 2013-05-04: qty 30

## 2013-05-04 NOTE — Progress Notes (Signed)
Notified eICU, temp 101.5. No new orders at this time.

## 2013-05-04 NOTE — Progress Notes (Addendum)
After preparing access site and catheter, the RT IJ vein temporary dialysis catheter is removed.  Manual pressure is applied for 5 minutes.  Site is intact.  No bleeding, no hematoma.         A sterile bandage is applied.    The catheter tip is collected by ICU staff.

## 2013-05-04 NOTE — Progress Notes (Signed)
[facility]- Critical Care Note     ICU Daily Progress Note        Date Time: 05/04/2013 9:45 PM  Patient Name: Adam Simmons  Attending Physician: Lawernce Ion, MD  Room: IC05/IC05-A   Admit Date: 04/24/2013  LOS: 10 days            Assessment:     Patient Active Problem List   Diagnosis   . Respiratory failure   . Pneumonia   . Hypokalemia   . ARDS (adult respiratory distress syndrome)   . DM (diabetes mellitus)   . AKI (acute kidney injury)   Day 10 mech vent  Pneumococcal sepsis.  ARDS  AKI,  S/p hemodialysis  Failing weaning trial, thick secretions.  Fever, improved post removal of central lines      Plan:   picc line placed.  Bronchoscopy for pulmonary toilet.  abx per id  nepro for nutritional support.        Subjective:   Sedated on vent    Medications:       Scheduled Meds: PRN Meds:         albuterol-ipratropium 3 mL Nebulization Q4H SCH   amLODIPine 5 mg per OG tube Daily   chlorhexidine 15 mL Mouth/Throat BID   docusate 100 mg Oral BID   enoxaparin 30 mg Subcutaneous Daily   ertapenem 1,000 mg Intravenous Q24H SCH   famotidine 20 mg per OG tube Daily   insulin aspart 1-12 Units Subcutaneous Q4H   insulin glargine 15 Units Subcutaneous QHS   lactobacillus/streoptococcus 1 capsule Oral Daily   levofloxacin 750 mg Intravenous Q48H   [COMPLETED] lidocaine 60 mg Intravenous Once   methylprednisolone 20 mg Intravenous QAM   nystatin 1 application Topical BID   polyethylene glycol 17 g Oral BID   sodium chloride (PF) 10 mL Intracatheter Daily   thiamine 100 mg Oral Daily       Continuous Infusions:       . midazolam 8 mg/hr (05/04/13 2120)   . propofol 30 mcg/kg/min (05/04/13 2120)         acetaminophen 500 mg Q4H PRN   albuterol 2.5 mg Q1H PRN   dextrose 15 g PRN   dextrose 25 mL PRN   glucagon (rDNA) 1 mg PRN   midazolam 2 mg Q1H PRN   morphine 2 mg Q2H PRN   vecuronium 10 mg Q6H PRN   zinc Oxide  PRN             Physical Exam:     Filed Vitals:    05/04/13 2100 05/04/13 2130 05/04/13 2135 05/04/13  2140   BP: 131/67 125/67 137/71 130/76   Pulse: 114 109 109 109   Temp:       TempSrc:       Resp: 29      Height:       Weight:       SpO2: 94% 99% 99% 99%     No data recorded.           02/12 0701 - 02/13 0700  In: 1610.96 [I.V.:907.39]  Out: 4600 [Urine:4600]       General Appearance: on vent  Mental status: sedated  Neuro:  Moves ext  H & N: obese, no jvd  Lungs: rhonchi biloat  Cardiac: reg  Abdomen:  Soft   Extremities: + edema  Skin: no rash      Data:       Vent Settings:  Vent Settings  Vent Mode: PRVC  FiO2: 100 %  Resp Rate (Set): 20   Vt (Set, mL): 500 mL  PIP Observed (cm H2O): 27 cm H2O  PEEP/EPAP: 5 cm H20  Pressure Support / IPAP: 10 cmH20  Mean Airway Pressure: 13 cmH20      Labs:     Recent CBC   Lab 05/04/13 0319 05/03/13 0349 05/02/13 0529   WBC 22.88* 22.50* 23.30*   RBC 3.98* 3.95* 4.22*   HGB 11.1* 11.2* 12.0*   HCT 35.5* 35.5* 38.1*   MCV 89.2 89.9 90.3   PLT 313 281 275         Lab 05/04/13 0319 05/03/13 0349 05/02/13 2154 05/02/13 0529   NA 144 145 145 --   K 4.1 4.2 4.5 --   CL 106 105 104 --   CO2 25 26 26  --   GLU 220* 210* 205* --   BUN 72.6* 74.3* 70.2* --   CREAT 2.1* 2.3* 2.1* --   MG 2.1 2.1 -- 2.1   PHOS 6.0* 6.9* -- 6.9*   AST 34 26 29 --   ALT 34 30 33 --   ALKPHOS 110 89 85 --   BILITOTAL 0.5 0.4 0.4 --   BILIDIRECT -- -- -- --   LIP -- -- -- --   BNP -- -- -- --   PT -- -- -- --   INR -- -- -- --   PTT -- -- -- --   DDIMER -- -- -- --   CK -- -- -- --   CKMB -- -- -- --   TROPI -- -- -- --   MYOGLOBIN -- -- -- --           Rads:     Radiology Results (24 Hour)     Procedure Component Value Units Date/Time    Non-Tunneled Cath Removal [facility]) [[id number]] Collected:05/04/13 1514    Order Status:Completed  Updated:05/04/13 1519    Narrative:    HISTORY:  Requires IV access for antibiotics.    PICC line insertion at the bedside in the IR Recovery:    PROCEDURE: The nature of the procedure, risks, benefits, and  alternatives were discussed with the patient's spouse. All  questions  were answered and consent was obtained.    This procedure was performed by Berline Lopes under my supervision.    Ultrasound was used to confirm patency of the superficial vein prior to  puncture.  Using standard sterile technique and 1% lidocaine anesthesia,  puncture of the vein was performed with a 21 gauge single wall needle  under direct sonographic guidance.  Sonographic images were recorded  pre- and post- puncture for documentation.   A 0.018 inch guidewire was  advanced into the vessel. The needle was removed and a peel-away sheath  was placed. Anatomic landmarks were used to estimate appropriate  catheter length. The catheter was then trimmed and was advanced through  the peel-away sheath. The sheath was removed. Excellent blood return and  fluid infusion was confirmed.  The lumen was flushed and capped. The  catheter was secured with an adhesive device and a sterile dressing was  applied.       Impression:     Successful placement of a 4 French single lumen PICC  catheter via right basilic venous approach at bedside in recovery under  ultrasound guidance. 45 cm catheter length was used. The Sherlock 3 CG  system was used for tip localization.  Lemar Livings, MD   05/04/2013 3:15 PM    PICC Line Placement [[id number]] Collected:05/04/13 1514    Order Status:Completed  Updated:05/04/13 1519    Narrative:    HISTORY:  Requires IV access for antibiotics.    PICC line insertion at the bedside in the IR Recovery:    PROCEDURE: The nature of the procedure, risks, benefits, and  alternatives were discussed with the patient's spouse. All questions  were answered and consent was obtained.    This procedure was performed by Berline Lopes under my supervision.    Ultrasound was used to confirm patency of the superficial vein prior to  puncture.  Using standard sterile technique and 1% lidocaine anesthesia,  puncture of the vein was performed with a 21 gauge single wall needle  under direct sonographic  guidance.  Sonographic images were recorded  pre- and post- puncture for documentation.   A 0.018 inch guidewire was  advanced into the vessel. The needle was removed and a peel-away sheath  was placed. Anatomic landmarks were used to estimate appropriate  catheter length. The catheter was then trimmed and was advanced through  the peel-away sheath. The sheath was removed. Excellent blood return and  fluid infusion was confirmed.  The lumen was flushed and capped. The  catheter was secured with an adhesive device and a sterile dressing was  applied.       Impression:     Successful placement of a 4 French single lumen PICC  catheter via right basilic venous approach at bedside in recovery under  ultrasound guidance. 45 cm catheter length was used. The Sherlock 3 CG  system was used for tip localization.    Lemar Livings, MD   05/04/2013 3:15 PM          Radiological Imaging personally reviewed.    I have personally reviewed the patient's history and 24 hour interval events, along with vitals, labs, radiology images and  ventilator settings and additional findings found in detail within ICU team notes, with their care plans developed with and reviewed by me.         Signed by: Durward Fortes, MD  Date/Time: 05/04/2013 9:45 PM

## 2013-05-04 NOTE — Progress Notes (Addendum)
Nutritional Support Services  Nutrition Follow Up    Adam Simmons 48 y.o. male   MRN: [id number]    Nutrition Summary/Diet history:  Pt is vented and sedated. Continues on Propofol.  Spoke with MD and will check triglycerides level.  Discussed pt with MD and due to elevated BUN, creat and phosphorus will change feeds to Nepro.    Nutrition Diagnosis:   Inadequate oral intake related to respiratory failure as evidenced by vent support.    Intervention:  1. While on current Propofol suggest Nepro @ 20 mL/hr = 1080 kcals, 48 g pro and 435 mL free water. (TF+ ProSource + Propofol = 1959 kcals and 108 g pro/d).  If tolerated may increase to goal of Nepro @ 25 mL/hr.  2. Continue with ProSource NoCarb 1 packet q 6 hrs..  3. Free water flush per MD  4. MD ordered triglycerides level.     Goal: Meet estimated needs with enteral nutrition support while vented.    Monitoring:   Evaluation:   1. Residuals < 500 mL < 150 mL  2. Weights   wts fluctuates  3. Net I&O   + 3 L since admission    Nutrition risk level: Moderate    Assessment Data:  Adm dx:  Respiratory failure   Patient Active Problem List   Diagnosis   . Respiratory failure   . Pneumonia   . Hypokalemia   . ARDS (adult respiratory distress syndrome)   . DM (diabetes mellitus)   . AKI (acute kidney injury)   PMH:  has a past medical history of Hypertension; Diabetes mellitus; and Kidney stones.  PSH:  has past surgical history that includes Kidney stone surgery.  Pertinent labs:   Lab 05/04/13 0319 05/03/13 0349 05/02/13 2154 05/02/13 0529 05/01/13 0345 04/30/13 0309   NA 144 145 145 146* 147* --   K 4.1 4.2 4.5 4.6 4.3 --   CL 106 105 104 104 106 --   CO2 25 26 26 28 27  --   BUN 72.6* 74.3* 70.2* 66.0* 67.2* --   CREAT 2.1* 2.3* 2.1* 2.1* 2.2* --   GLU 220* 210* 205* 218* 178* --   CA 8.2* 8.1* 8.1* 8.4* 8.4* --   MG 2.1 2.1 -- 2.1 2.1 2.0   PHOS 6.0* 6.9* -- 6.9* 5.6* 4.8*   EGFR 33.7 30.4 33.7 33.7 32.0 --   Blood glucose range (48 hrs) 153-220 mg/dL  (improving)  Pertinent meds: chlorhexidine, docusate, famotidine, insulin aspart, insulin glargine, lactobacillus/streoptococcus, methylprednisolone, polyethylene glycol, thiamine, versed, Propofol @ 24.2 mL/hr = 639 kcals/d (decreased some).  Social history:  married  Food Intolerance/Religious/Ethnic preferences: none noted  Diet Order:  Orders Placed This Encounter   Procedures   . Diet NPO effective now   . Prosource no carb Frequency:: All meals; Quantity:: A. One One packet q 6 hrs.   . Tube feeding-CONTINUOUS   Enteral nutrition:   Indication: vented  Route: OG  Frequency: continuous  Formula: Promote   Rate: 35 mL/hr   Formula provides: 840 kcals, 52 g pro and  705 mL free water. (TF+ ProSource + Propofol = 1719 kcals/d).  Additives: ProSource NoCarb 1 packet q 6 hrs = 240 kcals and 60 g pro   Water flushes: 300 mL q 4 hrs  Residuals: < 150 mL   GI symptoms: abd distended  Hydration: non pitting edema  Skin: Stage II sacrum  Anthropometrics  Height: 172.7 cm (5\' 8" )  Weight: 120 kg (264 lb 8.8  oz)  Weight Change: -1.56   IBW/kg (Calculated) Male: 70.02 kg  IBW/kg (Calculated) Male: 63.62 kg  BMI (calculated): 42.6   Wt Readings from Last 30 Encounters:   05/04/13 120 kg (264 lb 8.8 oz)   05/04/13 120 kg (264 lb 8.8 oz)   05/04/13 120 kg (264 lb 8.8 oz)   Learning Needs: no  Estimated Needs:  Estimated Energy Needs  Total Energy Estimated Needs: 1680-1820 kcals/kg  Method for Estimating Needs: 24-26 kcals/kg IBW  Estimated Protein Needs  Total Protein Estimated Needs: 140 g pro/kg  Method for Estimating Needs: 2 g protein/kg  Fluid Needs  Method for Estimating Needs: per MD  No Known Allergies    Geryl Councilman, RD, CNSC, CSO  Spectralink - [phone]

## 2013-05-04 NOTE — PT Progress Note (Signed)
[facility]  [address]  [place], [place]. [address]    Department of Rehabilitation  [phone]    Physical Therapy Daily Treatment Note    Patient: Adam Simmons    MRN#: [id number]     Time of Treatment: Start Time: 1242 Stop Time: 1259 Time Calculation (min): 17 min    PT Visit Number: 2    Patient's medical condition is appropriate for Physical Therapy intervention at this time.    Precautions and Contraindications: Intubated, sedated       Assessment: Limited session today as pt has multiple procedures planned for this afternoon, therefore sedation not lowered for SBT. Continues with increased RR at rest. Anticipate pt will require rehab placement upon d/c if able to wean from vent.  Assessment: Decreased UE strength;Decreased LE strength;Decreased functional mobility;Decreased endurance/activity tolerance Prognosis: Ongoing PT assessment needed;Patient remains in ICU/critical care   Progress: Slow progress, medical status limitations       Plan:   Continue with Physical Therapy services to address endurance and mobility deficits. Focus next session on EOB as appropriate.  Treatment/Interventions: Neuromuscular re-education;Functional transfer training;LE strengthening/ROM;Endurance training;Cognitive reorientation;Patient/family training;Equipment eval/education;Bed mobility   PT Frequency: 2-3x/wk     Discharge Recommendation:  (TBD if pt able to wean from vent)      Subjective: Nursing clears patient for therapy. Pt to have multiple lines changed and bronchoscopy today, so sedation not lowered for session or SBT. Wife present throughout session.        Objective:  Observation of Patient/Vital Signs:  Patient is in bed with telemetry, central line, mechanical ventilation via oral intubation and indwelling urinary catheter in place.    Cognition  Arousal/Alertness: Unresponsive to stimuli  Attention Span: Difficulty attending to directions  Following Commands: Does not follow  commands  Insights: Not aware of deficits    Functional Mobility  Functional Mobility Deferred (Comment): All due to increased sedation for procedures today        Therapeutic Exercise  PROM as below:  Shoulder flexion/extension Bx5  Elbow flexion/extension Bx5  Wrist flexion/extension Bx5  Finger flexion/extension Bx5    Hip/knee flexion Bx5  Hip abd/add Bx10  Hip IR/ER Bx10  Ankle DF/PF Bx5    Heel cord stretch B x30 sec  Piriformis stretch B x20-30 sec  Educated pt's wife on PROM to perform with pt when visiting. Instructed to alert RN prior to beginning for line management.     Treatment Activities: PROM as above. Limited session due to high levels of sedation. RR increased to upper 30s with ROM RLE. Maintained 27-30 during LLE ROM. Educated pt's wife on signs of pain/distress.    Educated the patient's wife to role of physical therapy, plan of care, goals of therapy and HEP.    At end of session pt supine in bed, all medical equipment intact. Wife present.      Delfin Edis, PT, DPT  Pager #: [phone]

## 2013-05-04 NOTE — Progress Notes (Signed)
Talked to patient's wife and the discharge plan still depends on when patient is extubated and then CM will discuss discharge options with pt's wife.  CM will f/u as needed.

## 2013-05-04 NOTE — Progress Notes (Signed)
Infectious Disease            Progress Note    05/04/2013   Adam Simmons ZOX:[id number],XBJ:[id number] is a 48 y.o. male, history of hypertension, diabetes mellitus, kidney stones. Admitted with septic shock, pneumonia, respiratory failure.    Subjective:     Adam Simmons today Symptoms: spike fever this morning, still intubated. Still leukocytosis on steroids, off pressors. Renal function improving    Objective:     Blood pressure 147/75, pulse 111, temperature 98.7 F (37.1 C), temperature source Foley Temp, resp. rate 25, height 1.727 m (5\' 8" ), weight 120 kg (264 lb 8.8 oz), SpO2 95.00%.    General Appearance: Intubated and sedated.    HEENT: Pallor negative, Anicteric sclera.  Intubated  Lungs:  Bilateral decreased breath sounds; few rhonchi  Heart: Normal rate.  S1 and S2.    Chest Wall: Symmetric chest wall expansion.   Abdomen: Abdomen is soft, scaphoid and non-distended. There are no signs of ascites. Bowel sounds are normal. Rectal tube in place  Neurological: Patient is intubated and sedated    Laboratory And Diagnostic Studies:     Recent Labs   [facility] 05/04/13 0319 05/03/13 0349    WBC 22.88* 22.50*    HGB 11.1* 11.2*    HCT 35.5* 35.5*    PLT 313 281    NEUTRO 87 73     Recent Labs   Basename 05/04/13 0319 05/03/13 0349    NA 144 145    K 4.1 4.2    CL 106 105    CO2 25 26    BUN 72.6* 74.3*    CREAT 2.1* 2.3*    GLU 220* 210*    CA 8.2* 8.1*       Blood cultures:  Strep pneumoniae      Current Med's:     Current Facility-Administered Medications   Medication Dose Route Frequency   . albuterol-ipratropium  3 mL Nebulization Q4H SCH   . amLODIPine  5 mg per OG tube Daily   . chlorhexidine  15 mL Mouth/Throat BID   . docusate  100 mg Oral BID   . enoxaparin  30 mg Subcutaneous Daily   . ertapenem  1,000 mg Intravenous Q24H SCH   . famotidine  20 mg per OG tube Daily   . insulin aspart  1-12 Units Subcutaneous Q4H   . insulin glargine  15 Units Subcutaneous QHS   . lactobacillus/streoptococcus   1 capsule Oral Daily   . levofloxacin  750 mg Intravenous Q48H   . methylprednisolone  20 mg Intravenous QAM   . nystatin  1 application Topical BID   . polyethylene glycol  17 g Oral BID   . thiamine  100 mg Oral Daily   . [DISCONTINUED] famotidine  20 mg per OG tube BID   . [DISCONTINUED] insulin glargine  30 Units Subcutaneous QHS       Line, Drains, Airways:     NG/OG Tube Orogastric 16 Fr. Center mouth (Active)   Number of days:10       Urethral Catheter Non-latex 16 Fr. (Active)   Number of days:10             CVC Triple Lumen 04/24/13 Left Internal jugular (Active)   Number of days:10       Permacath/Temporary Catheter 04/27/13 Temporary Catheter Right (Active)   Number of days:7         Assessment:      Condition.  Guarded   Septic shock;  Hoff presents   Strep pneumoniae sepsis     Respiratory failure, status post intubation   Bilateral pneumonia   Possible aspiration   Diabetes mellitus   Acute kidney injury   Thrombocytopenia; resolved   Elevated liver function tests; improved    Plan:      Continue  Invanz   Continue Levaquin   Continue ventilatory support   Continue supportive care   Correction of electrolytes   Nephrology follow up   Please place PICC line   Please remove dialysis catheter and central line   Sputum cultures   Discussed with wife          Alfonzo Beers, M.D.,FACP  05/04/2013  8:21 AM

## 2013-05-04 NOTE — Progress Notes (Signed)
Tylenol given for temp 102. Ice packs on patient. Will give an ice bath after bronch is complete.

## 2013-05-04 NOTE — Progress Notes (Signed)
Bronchoscopy Procedure Note    Procedure:  1. BRONCHOSCOPY    Pre-Operative Diagnosis:resp failure, mucus plugs    Post-Operative Diagnosis: Same    Indication: suction secretions    Anesthesia: sedation iv, xylocaine 1% 6cc intratracheally    Procedure Details: Patient was consented by wife with all risk and benefit of the procedure explained in detail, opportunity to ask questions and all concerns were answered.  The bronchocope was inserted into the main airway via the ETT. An anatomical survey was done of the main airways and the subsegmental bronchus.    The findings are: Thick mucoid scretions RLL and LLL, washed with saline aliquots and suctioned. Material collected for culture.ronchial tree, all segments open at end of procedure.  Tolerated well.  Estimated Blood Loss:  0           Specimens:  washings                Complications:  None  Disposition: remains on mech vent  Patient tolerated the procedure well.

## 2013-05-04 NOTE — Progress Notes (Signed)
[facility], [employer].  Progress Note    Assessment:  1.AKI- non-oliguric   2. Pneumonia with ARDS- on ventilator   3. Septic shock on pressors   4. Severe acidosis - combined respiratory and metabolic   5. DM on Metformin   6. HTN   7. Kidney stones   8. Exposure to NSAIDs prior to admission   9. Hypernatremia    Plan:  -Polyuric with recovering renal function  -Quinton taken out today,do not anticipate any further dialysis needs  -Slightly negative balance which is desired  -Getting bronch today      Eddie North, MD  Office - [phone]  ++++++++++++++++++++++++++++++++++++++++++++++++++++++++++++++  Subjective:  Sedated and intubated    Medications:  Scheduled Meds:  Current Facility-Administered Medications   Medication Dose Route Frequency   . albuterol-ipratropium  3 mL Nebulization Q4H SCH   . amLODIPine  5 mg per OG tube Daily   . chlorhexidine  15 mL Mouth/Throat BID   . docusate  100 mg Oral BID   . enoxaparin  30 mg Subcutaneous Daily   . ertapenem  1,000 mg Intravenous Q24H SCH   . famotidine  20 mg per OG tube Daily   . insulin aspart  1-12 Units Subcutaneous Q4H   . insulin glargine  15 Units Subcutaneous QHS   . lactobacillus/streoptococcus  1 capsule Oral Daily   . levofloxacin  750 mg Intravenous Q48H   . methylprednisolone  20 mg Intravenous QAM   . nystatin  1 application Topical BID   . polyethylene glycol  17 g Oral BID   . sodium chloride (PF)  10 mL Intracatheter Daily   . thiamine  100 mg Oral Daily     Continuous Infusions:     . midazolam 8 mg/hr (05/04/13 0903)   . propofol 30 mcg/kg/min (05/04/13 1400)     PRN Meds:acetaminophen, albuterol, dextrose, dextrose, glucagon (rDNA), midazolam, morphine, vecuronium, zinc Oxide    Objective:  Vital signs in last 24 hours:  Heart Rate:  [102-114] 110   Resp Rate:  [20-30] 20   BP: (118-149)/(64-84) 137/67 mmHg  FiO2:  [50 %] 50 %  Intake/Output last 24 hours:    Intake/Output Summary (Last 24 hours) at 05/04/13  1634  Last data filed at 05/04/13 1400   Gross per 24 hour   Intake 3204.2 ml   Output   4200 ml   Net -995.8 ml     Intake/Output this shift:  I/O this shift:  In: 751.6 [I.V.:257.6; NG/GT:444; IV Piggyback:50]  Out: 1600 [Urine:1600]    Physical Exam:   Gen: obese,intubated   CV: tachy   Chest: raspy on both sides but equal BS bilaterally   Ab: obese,bs diminished   Ext: edema    Labs:    Lab 05/04/13 0319 05/03/13 0349 05/02/13 2154 05/02/13 0529   GLU 220* 210* 205* --   BUN 72.6* 74.3* 70.2* --   CREAT 2.1* 2.3* 2.1* --   CA 8.2* 8.1* 8.1* --   NA 144 145 145 --   K 4.1 4.2 4.5 --   CL 106 105 104 --   CO2 25 26 26  --   ALB 1.9* 1.9* 1.8* --   PHOS 6.0* 6.9* -- 6.9*   MG 2.1 2.1 -- 2.1       Lab 05/04/13 0319 05/03/13 0349 05/02/13 0529   WBC 22.88* 22.50* 23.30*   HGB 11.1* 11.2* 12.0*   HCT 35.5* 35.5* 38.1*   MCV 89.2  89.9 90.3   MCH 27.9* 28.4 28.4   MCHC 31.3* 31.5* 31.5*   RDW 16* 16* 16*   MPV 10.6 10.5 10.6   PLT 313 281 275

## 2013-05-05 LAB — GLUCOSE WHOLE BLOOD - POCT
Whole Blood Glucose POCT: 258 mg/dL — ABNORMAL HIGH (ref 70–100)
Whole Blood Glucose POCT: 221 mg/dL — ABNORMAL HIGH (ref 70–100)
Whole Blood Glucose POCT: 206 mg/dL — ABNORMAL HIGH (ref 70–100)
Whole Blood Glucose POCT: 160 mg/dL — ABNORMAL HIGH (ref 70–100)

## 2013-05-05 LAB — MAN DIFF ONLY
Band Neutrophils Absolute: 1.62 10*3/uL — ABNORMAL HIGH (ref 0.00–1.00)
Band Neutrophils: 7 %
Basophils Absolute Manual: 0 10*3/uL (ref 0.00–0.20)
Basophils Manual: 0 %
Eosinophils Absolute Manual: 0 10*3/uL (ref 0.00–0.70)
Eosinophils Manual: 0 %
Lymphocytes Absolute Manual: 0.69 10*3/uL (ref 0.50–4.40)
Lymphocytes Manual: 3 %
Monocytes Absolute: 1.62 10*3/uL — ABNORMAL HIGH (ref 0.00–1.20)
Monocytes Manual: 7 %
Neutrophils Absolute Manual: 19.21 10*3/uL — ABNORMAL HIGH (ref 1.80–8.10)
Nucleated RBC: 0 (ref 0–1)
Segmented Neutrophils: 83 %

## 2013-05-05 LAB — URINALYSIS WITH MICROSCOPIC
Bilirubin, UA: NEGATIVE
Glucose, UA: 250 — AB
Ketones UA: NEGATIVE
Leukocyte Esterase, UA: NEGATIVE
Nitrite, UA: NEGATIVE
Protein, UR: 30 — AB
Specific Gravity UA: 1.019 (ref 1.001–1.035)
Urine pH: 6 (ref 5.0–8.0)
Urobilinogen, UA: 0.2 mg/dL (ref 0.2–2.0)

## 2013-05-05 LAB — COMPREHENSIVE METABOLIC PANEL
ALT: 33 U/L (ref 0–55)
AST (SGOT): 29 U/L (ref 5–34)
Albumin/Globulin Ratio: 0.4 — ABNORMAL LOW (ref 0.9–2.2)
Albumin: 1.9 g/dL — ABNORMAL LOW (ref 3.5–5.0)
Alkaline Phosphatase: 104 U/L (ref 40–150)
Anion Gap: 14 (ref 5.0–15.0)
BUN: 67.9 mg/dL — ABNORMAL HIGH (ref 9.0–21.0)
Bilirubin, Total: 0.6 mg/dL (ref 0.2–1.2)
CO2: 24 mEq/L (ref 22–29)
Calcium: 8.5 mg/dL (ref 8.5–10.5)
Chloride: 106 mEq/L (ref 98–107)
Creatinine: 2.1 mg/dL — ABNORMAL HIGH (ref 0.7–1.3)
Globulin: 4.9 g/dL — ABNORMAL HIGH (ref 2.0–3.6)
Glucose: 247 mg/dL — ABNORMAL HIGH (ref 70–100)
Potassium: 4 mEq/L (ref 3.5–5.1)
Protein, Total: 6.8 g/dL (ref 6.0–8.3)
Sodium: 144 mEq/L (ref 136–145)

## 2013-05-05 LAB — CELL MORPHOLOGY
Cell Morphology: ABNORMAL — AB
Platelet Estimate: NORMAL

## 2013-05-05 LAB — HEMOLYSIS INDEX: Hemolysis Index: 7 (ref 0–18)

## 2013-05-05 LAB — TRIGLYCERIDES: Triglycerides: 225 mg/dL — ABNORMAL HIGH (ref 34–149)

## 2013-05-05 LAB — CBC AND DIFFERENTIAL
Hematocrit: 36.1 % — ABNORMAL LOW (ref 42.0–52.0)
Hgb: 11.5 g/dL — ABNORMAL LOW (ref 13.0–17.0)
MCH: 28.5 pg (ref 28.0–32.0)
MCHC: 31.9 g/dL — ABNORMAL LOW (ref 32.0–36.0)
MCV: 89.4 fL (ref 80.0–100.0)
MPV: 10.9 fL (ref 9.4–12.3)
Platelets: 284 10*3/uL (ref 140–400)
RBC: 4.04 10*6/uL — ABNORMAL LOW (ref 4.70–6.00)
RDW: 16 % — ABNORMAL HIGH (ref 12–15)
WBC: 23.15 10*3/uL — ABNORMAL HIGH (ref 3.50–10.80)

## 2013-05-05 LAB — MAGNESIUM: Magnesium: 2.2 mg/dL (ref 1.6–2.6)

## 2013-05-05 LAB — PHOSPHORUS: Phosphorus: 5.7 mg/dL — ABNORMAL HIGH (ref 2.3–4.7)

## 2013-05-05 LAB — GFR: EGFR: 33.7

## 2013-05-05 MED ORDER — INSULIN GLARGINE 100 UNIT/ML SC SOLN
20.0000 [IU] | Freq: Every evening | SUBCUTANEOUS | Status: DC
Start: 2013-05-05 — End: 2013-05-08
  Administered 2013-05-05 – 2013-05-07 (×3): 20 [IU] via SUBCUTANEOUS
  Filled 2013-05-05 (×3): qty 200

## 2013-05-05 MED ORDER — VANCOMYCIN HCL 1000 MG IV SOLR
2500.0000 mg | Freq: Once | INTRAVENOUS | Status: AC
Start: 2013-05-05 — End: 2013-05-05
  Administered 2013-05-05: 2500 mg via INTRAVENOUS
  Filled 2013-05-05: qty 2500

## 2013-05-05 NOTE — Progress Notes (Signed)
[facility]- Critical Care Note     ICU Daily Progress Note        Date Time: 05/05/2013 12:10 PM  Patient Name: Adam Simmons  Attending Physician: Lawernce Ion, MD  Room: IC05/IC05-A   Admit Date: 04/24/2013  LOS: 11 days        Assessment/Plan:   # Neurologic: sedated, not candidate for sedation vacation given tachypnea. Critical care myopathy.     # Respiratory: multilobar Strep Pneumonia pneumonia, resolved ARDS.  Still tachypnic on ventilator. S/p bronchoscopy yesterday    # Nutrition: appreciate nutritionist recommendation, cont tube feeds    # Endocrine: Diabetes; uncontrolled, cont high dose sliding-scale insulin.  Increase to lantus 20 units tonight.    # Cardiac: septic shock resolved. Off pressors. Echo indicated EF 50%    # Hematologic: Thrombocytopenia most likely from underlying sepsis     #renal: acute kidney injury, appreciate Dr. Dalbert Batman group recs. Hemodialysis catheter removed.     # Infectious diseases: streptococcus pneumonia bacteremia/ pneumonia.  Appreciate Dr. Myrtis Ser consultation and evaluation. Continue antibiotics    Code Status: full code     I have personally reviewed the patient's history and 24 hour interval events, along with vitals, labs, radiology images, and nurses report.      discussed with patient's wife at the bedside and answered her questions.      Subjective:     48 y.o. male h/o DM type 2, HTN who presents to the hospital with respiratory failure, from streptococcus pneumonia, ARDS, AKI and septic shock.       tachypnic on ventilator.    Hospital Course  2/3   ARDS / PNA / Sepsis  w/ resp failure- intubated,  Flu test-neg  Resp c+s= gm + cocci , x2BC= gm + cocci resembling strep  2/6 Quinton placed for worsening creatinine, 14 L positive  2/8 dialysis  2/10 failed SBT  2/11 tachypnic, readjusted ventilator tidal volume.   2/13 hemodialysis catheter removed, bronchoscopy with improved pulmonary toilet    Medications:   Scheduled Meds:  Current  Facility-Administered Medications   Medication Dose Route Frequency   . albuterol-ipratropium  3 mL Nebulization Q4H SCH   . amLODIPine  5 mg per OG tube Daily   . chlorhexidine  15 mL Mouth/Throat BID   . docusate  100 mg Oral BID   . enoxaparin  30 mg Subcutaneous Daily   . ertapenem  1,000 mg Intravenous Q24H SCH   . famotidine  20 mg per OG tube Daily   . insulin aspart  1-12 Units Subcutaneous Q4H   . insulin glargine  15 Units Subcutaneous QHS   . lactobacillus/streoptococcus  1 capsule Oral Daily   . levofloxacin  750 mg Intravenous Q48H   . [COMPLETED] lidocaine  60 mg Intravenous Once   . methylprednisolone  20 mg Intravenous QAM   . nystatin  1 application Topical BID   . polyethylene glycol  17 g Oral BID   . sodium chloride (PF)  10 mL Intracatheter Daily   . thiamine  100 mg Oral Daily         Continuous Infusions:       . midazolam 6 mg/hr (05/05/13 0826)   . propofol 30 mcg/kg/min (05/05/13 0848)          Physical Exam:     Filed Vitals:    05/05/13 1115   BP:    Pulse:    Temp:    Resp:    SpO2: 94%  Intake/Output Summary (Last 24 hours) at 05/05/13 1210  Last data filed at 05/05/13 0600   Gross per 24 hour   Intake 2369.6 ml   Output   3300 ml   Net -930.4 ml     General Appearance: obese, sedated, intubated   Mental status: sedated  Neuro: awake, diffusely weak, intubated   Neck: supple   Lungs: bilateral rhonchi anteriorly, no wheeze  Cardiac: ns1, ns2, no m/r/g, tachycardic   Abdomen: Obese, soft, nontender, hypoactive bowel sounds   Extremities: trace lower extremity edema   Skin: Has band like abrasion around bilateral ankles, has a triangular mark on right forearm area that is scabbed over, has abrasion on left forearm, has grease on his hands, has abrasions on his sacral area, has various bruising on his upper extremities.  Anasarca improved.  Resolved left lateral foot fluid blister.     Data:       Invasive ICU Hemodynamics:    Invasive Hemodynamic Monitoring  CVP (mmHg): 22 mmHg  CO  (L/min): 10.4 L/min  CI (L/min/m2): 4.3 L/min/m2    Art Line  Arterial Line BP: 139/70 mmHg  Arterial Line MAP (mmHg): 89 mmHg  Arterial Line Location: Left radial  Art Line Wave Form: Appropriate wave forms      Vent Settings:    Vent Settings  Vent Mode: PRVC  FiO2: 50 %  Resp Rate (Set): 20   Vt (Set, mL): 500 mL  PIP Observed (cm H2O): 28 cm H2O  PEEP/EPAP: 5 cm H20  Pressure Support / IPAP: 10 cmH20  Mean Airway Pressure: 14 cmH20        Labs:         Labs (last 72 hours):  Recent Labs   Basename 05/05/13 0330 05/04/13 0319 05/03/13 0349    WBC 23.15* 22.88* 22.50*    HGB 11.5* 11.1* 11.2*    HCT 36.1* 35.5* 35.5*    LABPLAT -- -- --     No results found for this basename: PT:3,INR:3,PTT:3 in the last 72 hours Recent Labs   Basename 05/05/13 0426 05/04/13 0319 05/03/13 0349    NA 144 144 145    K 4.0 4.1 4.2    CL 106 106 105    CO2 24 25 26     BUN 67.9* 72.6* 74.3*    CREAT 2.1* 2.1* 2.3*    GLU 247* 220* 210*    CA 8.5 8.2* 8.1*    MG 2.2 2.1 2.1    PHOS 5.7* 6.0* 6.9*                     Rads:   Radiological Imaging personally reviewed,and agree with radiology report including:       CXR  05/03/2013  Multilobar  infiltrate      I have personally reviewed the patient's history and 24 hour interval events, along with vitals, labs, radiology images and nursing.         Signed by: Lawernce Ion, MD  Date/Time: 05/05/2013 12:10 PM

## 2013-05-05 NOTE — Consults (Signed)
Department of Pharmacy Vancomycin Dosing Consult:  Initiation    MRN: [id number]  Room/Bed: IC05/IC05-A    Adam Simmons is being treated with Vancomycin for + blood Cx/ gram + cocci resembling Staph.  Pharmacy was consulted to dose Vancomycin by Dr. Janalyn Rouse.    Dosing was based on the following parameters:  Pt Age:  48 y.o.  Pt Weight:  117.9kg  Pt Height:  5'8"  Estimated Creatinine Clearance: 53.7 ml/min (based on Cr of 2.1).  Target trough: 17.5 mcg/mL    The following orders were entered in EPIC:  Vancomycin loading dose of 2500 mg IV.  Vancomycin random level with AM labs on 05/07/13  Pharmacy will monitor drug levels and kidney function and adjust doses as necessary.    Thank you for the consult.  Please contact the pharmacy with any questions at  [phone].    Signed:  Meko Bellanger T Edvin Albus  Date/Time 05/05/2013 4:52 PM

## 2013-05-05 NOTE — Progress Notes (Signed)
[facility] [employer].  Progress Note    Assessment:   1.AKI- non-oliguric   2. Pneumonia with ARDS- on ventilator   3. Septic shock off pressors  4. Severe acidosis - combined respiratory and metabolic   5. DM on Metformin   6. HTN   7. Kidney stones   8. Exposure to NSAIDs prior to admission   9. Hypernatremia   10. Fluid overload, past 24 hrs in neg balance  Plan:   -Polyuric with recovering renal function   -Quinton taken out.  -Slightly negative balance which is desired   -Getting bronch today        Sherian Maroon, MD  Office [phone]  Spectra Link - n/a    ++++++++++++++++++++++++++++++++++++++++++++++++++++++++++++++  Subjective:  No obvious complaints    Medications:  Scheduled Meds:  Current Facility-Administered Medications   Medication Dose Route Frequency   . albuterol-ipratropium  3 mL Nebulization Q4H SCH   . amLODIPine  5 mg per OG tube Daily   . chlorhexidine  15 mL Mouth/Throat BID   . docusate  100 mg Oral BID   . enoxaparin  30 mg Subcutaneous Daily   . ertapenem  1,000 mg Intravenous Q24H SCH   . famotidine  20 mg per OG tube Daily   . insulin aspart  1-12 Units Subcutaneous Q4H   . insulin glargine  20 Units Subcutaneous QHS   . lactobacillus/streoptococcus  1 capsule Oral Daily   . levofloxacin  750 mg Intravenous Q48H   . [COMPLETED] lidocaine  60 mg Intravenous Once   . methylprednisolone  20 mg Intravenous QAM   . nystatin  1 application Topical BID   . polyethylene glycol  17 g Oral BID   . sodium chloride (PF)  10 mL Intracatheter Daily   . thiamine  100 mg Oral Daily   . [DISCONTINUED] insulin glargine  15 Units Subcutaneous QHS     Continuous Infusions:     . midazolam 6 mg/hr (05/05/13 0826)   . propofol 30 mcg/kg/min (05/05/13 1157)     PRN Meds:acetaminophen, albuterol, dextrose, dextrose, glucagon (rDNA), midazolam, morphine, vecuronium, zinc Oxide    Objective:  Vital signs in last 24 hours:  Heart Rate:  [100-116] 113   Resp Rate:  [25-31] 30   BP:  (106-158)/(57-89) 135/72 mmHg  FiO2:  [50 %-100 %] 50 %  Intake/Output last 24 hours:    Intake/Output Summary (Last 24 hours) at 05/05/13 1551  Last data filed at 05/05/13 1023   Gross per 24 hour   Intake 2416.47 ml   Output   2450 ml   Net -33.53 ml     Intake/Output this shift:  I/O this shift:  In: 175.67 [I.V.:125.67; IV Piggyback:50]  Out: -     Physical Exam:   Gen: intubated, obese, well developed, well nourished, No distress   Cardiac:  S1 S2,  no rub, no murmer   Chest: Clear, no crackles, no wheezes   Abdomen: obese, distended, soft, non tender, no organomegaly   Ext: 3+ edema, No cyanosis                 Labs:    Lab 05/05/13 0426 05/04/13 0319 05/03/13 0349   GLU 247* 220* 210*   BUN 67.9* 72.6* 74.3*   CREAT 2.1* 2.1* 2.3*   CA 8.5 8.2* 8.1*   NA 144 144 145   K 4.0 4.1 4.2   CL 106 106 105   CO2 24  25 26   ALB 1.9* 1.9* 1.9*   PHOS 5.7* 6.0* 6.9*   MG 2.2 2.1 2.1       Lab 05/05/13 0330 05/04/13 0319 05/03/13 0349   WBC 23.15* 22.88* 22.50*   HGB 11.5* 11.1* 11.2*   HCT 36.1* 35.5* 35.5*   MCV 89.4 89.2 89.9   MCH 28.5 27.9* 28.4   MCHC 31.9* 31.3* 31.5*   RDW 16* 16* 16*   MPV 10.9 10.6 10.5   PLT 284 313 281

## 2013-05-05 NOTE — Progress Notes (Signed)
Infectious Disease            Progress Note    05/05/2013   Adam Simmons [id number] is a 48 y.o. male, history of hypertension, diabetes mellitus, kidney stones. Admitted with septic shock, pneumonia, respiratory failure.    Subjective:     Adam Simmons today Symptoms: Afebrile, PICC line was placed and central line and dialysis catheter was removed, still intubated. Still leukocytosis on steroids, off pressors. Renal function stable    Objective:     Blood pressure 123/64, pulse 100, temperature 98.7 F (37.1 C), temperature source Foley Temp, resp. rate 30, height 1.727 m (5\' 8" ), weight 117.9 kg (259 lb 14.8 oz), SpO2 96.00%.    General Appearance: Intubated and sedated.    HEENT: Pallor negative, Anicteric sclera.  Intubated  Lungs:  Bilateral decreased breath sounds  Heart: Normal rate.  S1 and S2.    Chest Wall: Symmetric chest wall expansion.   Abdomen: Abdomen is soft, scaphoid and non-distended. There are no signs of ascites. Bowel sounds are normal. Rectal tube in place  Neurological: Patient is intubated and sedated    Laboratory And Diagnostic Studies:     Recent Labs   [facility] 05/05/13 0330 05/04/13 0319    WBC 23.15* 22.88*    HGB 11.5* 11.1*    HCT 36.1* 35.5*    PLT 284 313    NEUTRO 83 87     Recent Labs   Basename 05/05/13 0426 05/04/13 0319    NA 144 144    K 4.0 4.1    CL 106 106    CO2 24 25    BUN 67.9* 72.6*    CREAT 2.1* 2.1*    GLU 247* 220*    CA 8.5 8.2*       Blood cultures:  Repeat cultures pending      Current Med's:     Current Facility-Administered Medications   Medication Dose Route Frequency   . albuterol-ipratropium  3 mL Nebulization Q4H SCH   . amLODIPine  5 mg per OG tube Daily   . chlorhexidine  15 mL Mouth/Throat BID   . docusate  100 mg Oral BID   . enoxaparin  30 mg Subcutaneous Daily   . ertapenem  1,000 mg Intravenous Q24H SCH   . famotidine  20 mg per OG tube Daily   . insulin aspart  1-12 Units Subcutaneous Q4H   . insulin glargine  15 Units  Subcutaneous QHS   . lactobacillus/streoptococcus  1 capsule Oral Daily   . levofloxacin  750 mg Intravenous Q48H   . [COMPLETED] lidocaine  60 mg Intravenous Once   . methylprednisolone  20 mg Intravenous QAM   . nystatin  1 application Topical BID   . polyethylene glycol  17 g Oral BID   . sodium chloride (PF)  10 mL Intracatheter Daily   . thiamine  100 mg Oral Daily             Assessment:      Condition.  Guarded   Septic shock; off presents   Strep pneumoniae sepsis     Respiratory failure, status post intubation   Bilateral pneumonia   Possible aspiration   Diabetes mellitus   Acute kidney injury   Thrombocytopenia; resolved   Elevated liver function tests; improved    Plan:      Continue  Invanz   Continue Levaquin   Continue ventilatory support   Continue supportive care   Correction of electrolytes  Nephrology follow up   Will follow cultures          Alfonzo Beers, M.D.,FACP  05/05/2013  9:45 AM

## 2013-05-05 NOTE — Plan of Care (Signed)
Problem: Potential for Compromised Skin Integrity  Goal: Skin integrity is maintained or improved  Assess and monitor skin integrity. Identify patients at risk for skin breakdown on admission and per policy. Collaborate with interdisciplinary team and initiate plans and interventions as needed.   Intervention: Keep skin clean and dry  Patient had a total of 4 stools that progressed to liquid consistency. Dignishield placed to protect patients already irritated skin. See full nursing skin assessment. Turning and repositioning patient q2h.

## 2013-05-06 LAB — CBC
Hematocrit: 33.9 % — ABNORMAL LOW (ref 42.0–52.0)
Hgb: 10.7 g/dL — ABNORMAL LOW (ref 13.0–17.0)
MCH: 28.4 pg (ref 28.0–32.0)
MCHC: 31.6 g/dL — ABNORMAL LOW (ref 32.0–36.0)
MCV: 89.9 fL (ref 80.0–100.0)
MPV: 11 fL (ref 9.4–12.3)
Platelets: 253 10*3/uL (ref 140–400)
RBC: 3.77 10*6/uL — ABNORMAL LOW (ref 4.70–6.00)
RDW: 16 % — ABNORMAL HIGH (ref 12–15)
WBC: 19.31 10*3/uL — ABNORMAL HIGH (ref 3.50–10.80)

## 2013-05-06 LAB — BASIC METABOLIC PANEL
Anion Gap: 14 (ref 5.0–15.0)
BUN: 67 mg/dL — ABNORMAL HIGH (ref 9.0–21.0)
CO2: 22 mEq/L (ref 22–29)
Calcium: 8.5 mg/dL (ref 8.5–10.5)
Chloride: 108 mEq/L — ABNORMAL HIGH (ref 98–107)
Creatinine: 2 mg/dL — ABNORMAL HIGH (ref 0.7–1.3)
Glucose: 210 mg/dL — ABNORMAL HIGH (ref 70–100)
Potassium: 3.7 mEq/L (ref 3.5–5.1)
Sodium: 144 mEq/L (ref 136–145)

## 2013-05-06 LAB — GLUCOSE WHOLE BLOOD - POCT
Whole Blood Glucose POCT: 169 mg/dL — ABNORMAL HIGH (ref 70–100)
Whole Blood Glucose POCT: 170 mg/dL — ABNORMAL HIGH (ref 70–100)
Whole Blood Glucose POCT: 216 mg/dL — ABNORMAL HIGH (ref 70–100)
Whole Blood Glucose POCT: 216 mg/dL — ABNORMAL HIGH (ref 70–100)
Whole Blood Glucose POCT: 231 mg/dL — ABNORMAL HIGH (ref 70–100)
Whole Blood Glucose POCT: 238 mg/dL — ABNORMAL HIGH (ref 70–100)

## 2013-05-06 LAB — MAGNESIUM: Magnesium: 2.1 mg/dL (ref 1.6–2.6)

## 2013-05-06 LAB — PHOSPHORUS: Phosphorus: 5.8 mg/dL — ABNORMAL HIGH (ref 2.3–4.7)

## 2013-05-06 LAB — GFR: EGFR: 35.7

## 2013-05-06 MED ORDER — ACETYLCYSTEINE 10 % IN SOLN
3.0000 mL | RESPIRATORY_TRACT | Status: AC
Start: 2013-05-06 — End: 2013-05-08
  Administered 2013-05-06 – 2013-05-08 (×11): 3 mL via RESPIRATORY_TRACT
  Filled 2013-05-06 (×12): qty 4

## 2013-05-06 MED ORDER — INSULIN ASPART 100 UNIT/ML SC SOLN
10.0000 [IU] | Freq: Once | SUBCUTANEOUS | Status: AC
Start: 2013-05-06 — End: 2013-05-06
  Administered 2013-05-06: 10 [IU] via SUBCUTANEOUS
  Filled 2013-05-06: qty 100

## 2013-05-06 NOTE — Progress Notes (Signed)
[facility] [employer].  Progress Note    Assessment:  1.AKI- non-oliguric   2. Pneumonia with ARDS- on ventilator   3. Septic shock off pressors   4. Severe acidosis - combined respiratory and metabolic   5. DM on Metformin   6. HTN   7. Kidney stones   8. Exposure to NSAIDs prior to admission   9. Hypernatremia   10. Fluid overload, past 24 hrs in neg balance      Plan:  -Polyuric with recovering renal function which is stable.Do not anticipate any furtehr dialysis needs  -Quinton taken out.   -Slightly negative balance which is desired   -continue to follow renal fucntions        Eddie North, MD  Office [phone]  ++++++++++++++++++++++++++++++++++++++++++++++++++++++++++++++  Subjective:  S/p bronch,no acute events    Medications:  Scheduled Meds:  Current Facility-Administered Medications   Medication Dose Route Frequency   . acetylcysteine  3 mL Nebulization Q4H SCH   . albuterol-ipratropium  3 mL Nebulization Q4H SCH   . amLODIPine  5 mg per OG tube Daily   . chlorhexidine  15 mL Mouth/Throat BID   . docusate  100 mg Oral BID   . enoxaparin  30 mg Subcutaneous Daily   . ertapenem  1,000 mg Intravenous Q24H SCH   . famotidine  20 mg per OG tube Daily   . insulin aspart  1-12 Units Subcutaneous Q4H   . [COMPLETED] insulin aspart  10 Units Subcutaneous Once   . insulin glargine  20 Units Subcutaneous QHS   . lactobacillus/streoptococcus  1 capsule Oral Daily   . levofloxacin  750 mg Intravenous Q48H   . methylprednisolone  20 mg Intravenous QAM   . nystatin  1 application Topical BID   . polyethylene glycol  17 g Oral BID   . sodium chloride (PF)  10 mL Intracatheter Daily   . thiamine  100 mg Oral Daily   . [COMPLETED] vancomycin  2,500 mg Intravenous Once     Continuous Infusions:     . midazolam 6 mg/hr (05/06/13 1002)   . propofol 35 mcg/kg/min (05/06/13 1226)     PRN Meds:acetaminophen, albuterol, dextrose, dextrose, glucagon (rDNA), midazolam, morphine, vecuronium, zinc  Oxide    Objective:  Vital signs in last 24 hours:  Temp:  [97 F (36.1 C)-98.2 F (36.8 C)] 98.2 F (36.8 C)  Heart Rate:  [94-118] 97   Resp Rate:  [22-35] 23   BP: (101-148)/(52-79) 117/61 mmHg  FiO2:  [45 %-50 %] 45 %  Intake/Output last 24 hours:    Intake/Output Summary (Last 24 hours) at 05/06/13 1248  Last data filed at 05/06/13 1002   Gross per 24 hour   Intake 3379.19 ml   Output   4450 ml   Net -1070.81 ml     Intake/Output this shift:  I/O this shift:  In: 698.8 [I.V.:263.8; NG/GT:435]  Out: 600 [Urine:600]    Physical Exam:   [id number] and sedated   CV: S1 S2 N RRR   Chest: coarse BS bilaterally   Ab: distended,BS present   Ext: anasarca+    Labs:    Lab 05/06/13 0453 05/05/13 0426 05/04/13 0319 05/03/13 0349   GLU 210* 247* 220* --   BUN 67.0* 67.9* 72.6* --   CREAT 2.0* 2.1* 2.1* --   CA 8.5 8.5 8.2* --   NA 144 144 144 --   K 3.7 4.0 4.1 --   CL 108* 106  106 --   CO2 22 24 25  --   ALB -- 1.9* 1.9* 1.9*   PHOS 5.8* 5.7* 6.0* --   MG 2.1 2.2 2.1 --       Lab 05/06/13 0453 05/05/13 0330 05/04/13 0319   WBC 19.31* 23.15* 22.88*   HGB 10.7* 11.5* 11.1*   HCT 33.9* 36.1* 35.5*   MCV 89.9 89.4 89.2   MCH 28.4 28.5 27.9*   MCHC 31.6* 31.9* 31.3*   RDW 16* 16* 16*   MPV 11.0 10.9 10.6   PLT 253 284 313

## 2013-05-06 NOTE — Progress Notes (Signed)
Infectious Disease            Progress Note    05/06/2013   Adam Simmons [id number] is a 48 y.o. male, history of hypertension, diabetes mellitus, kidney stones. Admitted with septic shock, pneumonia, respiratory failure.    Subjective:     Adam Simmons today Symptoms: One bottle of blood cultures growing gram-positive cocci. Status post bronchoscopy. Afebrile, still intubated, off pressors. Renal function stable    Objective:     Blood pressure 128/58, pulse 106, temperature 98.2 F (36.8 C), temperature source Foley Temp, resp. rate 27, height 1.727 m (5\' 8" ), weight 117.9 kg (259 lb 14.8 oz), SpO2 95.00%.    General Appearance: Intubated and sedated.    HEENT: Pallor negative, Anicteric sclera.  Intubated  Lungs:  Bilateral decreased breath sounds  Heart: Normal rate.  S1 and S2.    Chest Wall: Symmetric chest wall expansion.   Abdomen: Abdomen is soft, scaphoid and non-distended. There are no signs of ascites. Bowel sounds are normal. Rectal tube in place  Neurological: Patient is intubated and sedated    Laboratory And Diagnostic Studies:     Recent Labs   [facility] 05/06/13 0453 05/05/13 0330 05/04/13 0319    WBC 19.31* 23.15* --    HGB 10.7* 11.5* --    HCT 33.9* 36.1* --    PLT 253 284 --    NEUTRO -- 83 87     Recent Labs   Basename 05/06/13 0453 05/05/13 0426    NA 144 144    K 3.7 4.0    CL 108* 106    CO2 22 24    BUN 67.0* 67.9*    CREAT 2.0* 2.1*    GLU 210* 247*    CA 8.5 8.5       Blood cultures:  Gram-positive cocci      Current Med's:     Current Facility-Administered Medications   Medication Dose Route Frequency   . albuterol-ipratropium  3 mL Nebulization Q4H SCH   . amLODIPine  5 mg per OG tube Daily   . chlorhexidine  15 mL Mouth/Throat BID   . docusate  100 mg Oral BID   . enoxaparin  30 mg Subcutaneous Daily   . ertapenem  1,000 mg Intravenous Q24H SCH   . famotidine  20 mg per OG tube Daily   . insulin aspart  1-12 Units Subcutaneous Q4H   . insulin glargine  20  Units Subcutaneous QHS   . lactobacillus/streoptococcus  1 capsule Oral Daily   . levofloxacin  750 mg Intravenous Q48H   . methylprednisolone  20 mg Intravenous QAM   . nystatin  1 application Topical BID   . polyethylene glycol  17 g Oral BID   . sodium chloride (PF)  10 mL Intracatheter Daily   . thiamine  100 mg Oral Daily   . [COMPLETED] vancomycin  2,500 mg Intravenous Once   . [DISCONTINUED] insulin glargine  15 Units Subcutaneous QHS             Assessment:      Condition.  Guarded   Septic shock; resolved   Strep pneumoniae sepsis   Gram-positive bacteremia     Respiratory failure, status post intubation   Bilateral pneumonia   Possible aspiration   Diabetes mellitus   Acute kidney injury; off dialysis    Plan:      Continue  Invanz   Continue Levaquin   Received close of vancomycin yesterday   Continue  ventilatory support   Continue supportive care   Correction of electrolytes   Nephrology follow up   Discussed with Dr. Roxan Hockey, M.D.,FACP  05/06/2013  10:24 AM

## 2013-05-06 NOTE — Progress Notes (Signed)
[facility]- Critical Care Note     ICU Daily Progress Note        Date Time: 05/06/2013 11:28 AM  Patient Name: Adam Simmons  Attending Physician: Lawernce Ion, MD  Room: IC05/IC05-A   Admit Date: 04/24/2013  LOS: 12 days        Assessment/Plan:   # Neurologic: sedated, not candidate for sedation vacation given tachypnea. Critical care myopathy.     # Respiratory: multilobar Strep Pneumonia pneumonia, resolved ARDS.  Still tachypnic on ventilator.  Still tachypnic on ventilator. Order chest physiotherapy and mucomyst.     # Nutrition: appreciate nutritionist recommendation, cont tube feeds    # Endocrine: Diabetes; uncontrolled, cont high dose sliding-scale insulin.  Increase to lantus 20 units tonight.    # Cardiac: septic shock resolved. Off pressors. Echo indicated EF 50%    # Hematologic: Thrombocytopenia most likely from underlying sepsis     #renal: acute kidney injury, appreciate Dr. Dalbert Batman group recs. Hemodialysis catheter removed.     # Infectious diseases: streptococcus pneumonia bacteremia/ pneumonia.  Appreciate Dr. Myrtis Ser consultation and evaluation. Continue antibiotics    Code Status: full code     I have personally reviewed the patient's history and 24 hour interval events, along with vitals, labs, radiology images, and nurses report.      discussed with patient's wife at the bedside and answered her questions.      Subjective:     48 y.o. male h/o DM type 2, HTN who presents to the hospital with respiratory failure, from streptococcus pneumonia, ARDS, AKI and septic shock.       Sedated last night, which improve tachypnea.     Hospital Course  2/3   ARDS / PNA / Sepsis  w/ resp failure- intubated,  Flu test-neg  Resp c+s= gm + cocci , x2BC= gm + cocci resembling strep  2/6 Quinton placed for worsening creatinine, 14 L positive  2/8 dialysis  2/10 failed SBT  2/11 tachypnic, readjusted ventilator tidal volume.   2/13 hemodialysis catheter removed, bronchoscopy with improved  pulmonary toilet  2/15 pulmonary toilet, mucomyst, chest PT vest  Medications:   Scheduled Meds:  Current Facility-Administered Medications   Medication Dose Route Frequency   . albuterol-ipratropium  3 mL Nebulization Q4H SCH   . amLODIPine  5 mg per OG tube Daily   . chlorhexidine  15 mL Mouth/Throat BID   . docusate  100 mg Oral BID   . enoxaparin  30 mg Subcutaneous Daily   . ertapenem  1,000 mg Intravenous Q24H SCH   . famotidine  20 mg per OG tube Daily   . insulin aspart  1-12 Units Subcutaneous Q4H   . insulin glargine  20 Units Subcutaneous QHS   . lactobacillus/streoptococcus  1 capsule Oral Daily   . levofloxacin  750 mg Intravenous Q48H   . methylprednisolone  20 mg Intravenous QAM   . nystatin  1 application Topical BID   . polyethylene glycol  17 g Oral BID   . sodium chloride (PF)  10 mL Intracatheter Daily   . thiamine  100 mg Oral Daily   . [COMPLETED] vancomycin  2,500 mg Intravenous Once   . [DISCONTINUED] insulin glargine  15 Units Subcutaneous QHS         Continuous Infusions:       . midazolam 8 mg/hr (05/05/13 2212)   . propofol 50 mcg/kg/min (05/06/13 0950)          Physical Exam:  Filed Vitals:    05/06/13 1100   BP: 117/58   Pulse: 99   Temp:    Resp:    SpO2: 95%         Intake/Output Summary (Last 24 hours) at 05/06/13 1128  Last data filed at 05/06/13 1000   Gross per 24 hour   Intake 2680.39 ml   Output   4450 ml   Net -1769.61 ml     General Appearance: obese, sedated, intubated   Mental status: sedated  Neuro: awake, diffusely weak, intubated   Neck: supple   Lungs: bilateral rhonchi anteriorly, no wheeze  Cardiac: ns1, ns2, no m/r/g, tachycardic   Abdomen: Obese, soft, nontender, hypoactive bowel sounds   Extremities: trace lower extremity edema   Skin:  has a triangular mark on right forearm area that is scabbed over, has abrasion on left forearm, has grease on his hands, has abrasions on his sacral area, has various bruising on his upper extremities.  Anasarca improved.    Data:        Invasive ICU Hemodynamics:    Invasive Hemodynamic Monitoring  CVP (mmHg): 22 mmHg  CO (L/min): 10.4 L/min  CI (L/min/m2): 4.3 L/min/m2    Art Line  Arterial Line BP: 139/70 mmHg  Arterial Line MAP (mmHg): 89 mmHg  Arterial Line Location: Left radial  Art Line Wave Form: Appropriate wave forms      Vent Settings:    Vent Settings  Vent Mode: PRVC  FiO2: 50 %  Resp Rate (Set): 20   Vt (Set, mL): 500 mL  PIP Observed (cm H2O): 36 cm H2O  PEEP/EPAP: 5 cm H20  Pressure Support / IPAP: 15 cmH20  Mean Airway Pressure: 16 cmH20        Labs:       Labs (last 72 hours):  Recent Labs   Basename 05/06/13 0453 05/05/13 0330 05/04/13 0319    WBC 19.31* 23.15* 22.88*    HGB 10.7* 11.5* 11.1*    HCT 33.9* 36.1* 35.5*    LABPLAT -- -- --     No results found for this basename: PT:3,INR:3,PTT:3 in the last 72 hours Recent Labs   Basename 05/06/13 0453 05/05/13 0426 05/04/13 0319    NA 144 144 144    K 3.7 4.0 4.1    CL 108* 106 106    CO2 22 24 25     BUN 67.0* 67.9* 72.6*    CREAT 2.0* 2.1* 2.1*    GLU 210* 247* 220*    CA 8.5 8.5 8.2*    MG 2.1 2.2 2.1    PHOS 5.8* 5.7* 6.0*                     Rads:   Radiological Imaging personally reviewed,and agree with radiology report including:       CXR  05/03/2013  Multilobar  infiltrate      I have personally reviewed the patient's history and 24 hour interval events, along with vitals, labs, radiology images and nursing.         Signed by: Lawernce Ion, MD  Date/Time: 05/06/2013 11:28 AM

## 2013-05-06 NOTE — Progress Notes (Signed)
Daily Progress Note    Date Time: 05/06/2013 3:21 PM  Patient Name: Adam Simmons  Attending Physician: Lawernce Ion, MD  Room: IC05/IC05-A   Admit Date: 04/24/2013  LOS: 12 days        Assessment/Plan:   #Neuro: sedated on propofol and versed drips. RASS  -3, Weaned sedation. Grimaces to painful stimuli. Opening eyes to voice.    #Cardio: ST. Febrile - tylenol given.     #Resp: RR greater than 30. MD aware. No new orders given. Chest PT started.     #GI: rounded, distention- bowel regimen-on hold due to diarrhea. Fecal management device.           #Renal /Fluid, Electrolytes : foley- u/o adequate.      #Endo: accuchecks-coverage given.    #Skin: Desitin applied to peri area.      #Nutrition: TFs to goal. Free water given. Prosource given.   #Lines: TLC PICC.     #Prophylaxis:   GI Prophylaxis:  pepcid  VTE Prophylaxis: lovenox and SCDs.    Wife at bedside and updated.        Medications:   Scheduled Meds:  Current Facility-Administered Medications   Medication Dose Route Frequency   . acetylcysteine  3 mL Nebulization Q4H SCH   . albuterol-ipratropium  3 mL Nebulization Q4H SCH   . amLODIPine  5 mg per OG tube Daily   . chlorhexidine  15 mL Mouth/Throat BID   . docusate  100 mg Oral BID   . enoxaparin  30 mg Subcutaneous Daily   . ertapenem  1,000 mg Intravenous Q24H SCH   . famotidine  20 mg per OG tube Daily   . insulin aspart  1-12 Units Subcutaneous Q4H   . [COMPLETED] insulin aspart  10 Units Subcutaneous Once   . insulin glargine  20 Units Subcutaneous QHS   . lactobacillus/streoptococcus  1 capsule Oral Daily   . levofloxacin  750 mg Intravenous Q48H   . methylprednisolone  20 mg Intravenous QAM   . nystatin  1 application Topical BID   . polyethylene glycol  17 g Oral BID   . sodium chloride (PF)  10 mL Intracatheter Daily   . thiamine  100 mg Oral Daily   . [COMPLETED] vancomycin  2,500 mg Intravenous Once         Continuous Infusions:       . midazolam 6 mg/hr (05/06/13 1002)   . propofol 35 mcg/kg/min  (05/06/13 1226)        Data:    Vent Settings:    Vent Settings  Vent Mode: PRVC  FiO2: 45 %  Resp Rate (Set): 20   Vt (Set, mL): 500 mL  PIP Observed (cm H2O): 33 cm H2O  PEEP/EPAP: 5 cm H20  Pressure Support / IPAP: 15 cmH20  Mean Airway Pressure: 15 cmH20        Labs:   Labs (last 72 hours):  Recent Labs   Basename 05/06/13 0453 05/05/13 0330 05/04/13 0319    WBC 19.31* 23.15* 22.88*    HGB 10.7* 11.5* 11.1*    HCT 33.9* 36.1* 35.5*    LABPLAT -- -- --     No results found for this basename: PT:3,INR:3,PTT:3 in the last 72 hours Recent Labs   [facility] 05/06/13 0453 05/05/13 0426 05/04/13 0319    NA 144 144 144    K 3.7 4.0 4.1    CL 108* 106 106    CO2 22 24 25  BUN 67.0* 67.9* 72.6*    CREAT 2.0* 2.1* 2.1*    GLU 210* 247* 220*    CA 8.5 8.5 8.2*    MG 2.1 2.2 2.1    PHOS 5.8* 5.7* 6.0*

## 2013-05-06 NOTE — Plan of Care (Signed)
Problem: Potential for Compromised Hemodynamic Status  Goal: Stable vital signs and fluid balance  Outcome: Progressing  Intervention: Position patient for maximum circulation / cardiac output.  HOB @ 30.   Intervention: Monitor/assess vitals and hemodynamic parameters with position changes  VSS  Intervention: Encourage oral fluid intake.  Free water given via OG.       Problem: Inadequate Gas Exchange  Goal: Adequately oxygenating and ventilation is improved  Outcome: Progressing  Intervention: Encourage/Assist patient as needed to turn, cough and perform deep breathing every 2 hours.  Chest PT started.       Problem: Inadequate airway clearance  Goal: Patent Airway maintained  Outcome: Progressing  Intervention: Suction secretions as needed  Suction done PRN.      Comments:   Continue to monitor.

## 2013-05-07 DIAGNOSIS — A403 Sepsis due to Streptococcus pneumoniae: Secondary | ICD-10-CM | POA: Diagnosis present

## 2013-05-07 LAB — GLUCOSE WHOLE BLOOD - POCT
Whole Blood Glucose POCT: 186 mg/dL — ABNORMAL HIGH (ref 70–100)
Whole Blood Glucose POCT: 221 mg/dL — ABNORMAL HIGH (ref 70–100)
Whole Blood Glucose POCT: 225 mg/dL — ABNORMAL HIGH (ref 70–100)
Whole Blood Glucose POCT: 231 mg/dL — ABNORMAL HIGH (ref 70–100)
Whole Blood Glucose POCT: 258 mg/dL — ABNORMAL HIGH (ref 70–100)
Whole Blood Glucose POCT: 276 mg/dL — ABNORMAL HIGH (ref 70–100)
Whole Blood Glucose POCT: 290 mg/dL — ABNORMAL HIGH (ref 70–100)

## 2013-05-07 LAB — CBC
Hematocrit: 32.8 % — ABNORMAL LOW (ref 42.0–52.0)
Hgb: 10.4 g/dL — ABNORMAL LOW (ref 13.0–17.0)
MCH: 28 pg (ref 28.0–32.0)
MCHC: 31.7 g/dL — ABNORMAL LOW (ref 32.0–36.0)
MCV: 88.4 fL (ref 80.0–100.0)
MPV: 11.4 fL (ref 9.4–12.3)
Platelets: 219 10*3/uL (ref 140–400)
RBC: 3.71 10*6/uL — ABNORMAL LOW (ref 4.70–6.00)
RDW: 16 % — ABNORMAL HIGH (ref 12–15)
WBC: 18.39 10*3/uL — ABNORMAL HIGH (ref 3.50–10.80)

## 2013-05-07 LAB — BASIC METABOLIC PANEL
Anion Gap: 13 (ref 5.0–15.0)
BUN: 71.3 mg/dL — ABNORMAL HIGH (ref 9.0–21.0)
CO2: 22 mEq/L (ref 22–29)
Calcium: 8.3 mg/dL — ABNORMAL LOW (ref 8.5–10.5)
Chloride: 109 mEq/L — ABNORMAL HIGH (ref 98–107)
Creatinine: 1.9 mg/dL — ABNORMAL HIGH (ref 0.7–1.3)
Glucose: 207 mg/dL — ABNORMAL HIGH (ref 70–100)
Potassium: 3.2 mEq/L — ABNORMAL LOW (ref 3.5–5.1)
Sodium: 144 mEq/L (ref 136–145)

## 2013-05-07 LAB — VANCOMYCIN, RANDOM: Vancomycin Random: 13 ug/mL

## 2013-05-07 LAB — GFR: EGFR: 37.9

## 2013-05-07 MED ORDER — NYSTATIN 100000 UNIT/GM EX OINT
TOPICAL_OINTMENT | Freq: Two times a day (BID) | CUTANEOUS | Status: DC
Start: 2013-05-07 — End: 2013-05-23
  Administered 2013-05-07 – 2013-05-21 (×2): 1 g via TOPICAL
  Filled 2013-05-07 (×3): qty 15

## 2013-05-07 MED ORDER — SODIUM CHLORIDE 0.9 % IV MBP
2.0000 g | INTRAVENOUS | Status: DC
Start: 2013-05-07 — End: 2013-05-19
  Administered 2013-05-07 – 2013-05-18 (×12): 2 g via INTRAVENOUS
  Filled 2013-05-07 (×13): qty 2000

## 2013-05-07 MED ORDER — POTASSIUM CHLORIDE 20 MEQ/15ML (10%) PO LIQD UD CUP
40.0000 meq | Freq: Every day | ORAL | Status: DC
Start: 2013-05-07 — End: 2013-05-09
  Administered 2013-05-07 – 2013-05-09 (×3): 40 meq via ORAL
  Filled 2013-05-07 (×3): qty 30

## 2013-05-07 MED ORDER — FLUCONAZOLE IN SODIUM CHLORIDE 200-0.9 MG/100ML-% IV SOLN
200.0000 mg | INTRAVENOUS | Status: DC
Start: 2013-05-07 — End: 2013-05-09
  Administered 2013-05-07 – 2013-05-09 (×3): 200 mg via INTRAVENOUS
  Filled 2013-05-07 (×3): qty 100

## 2013-05-07 NOTE — Progress Notes (Signed)
Infectious Disease            Progress Note    05/07/2013   Adam Simmons ZOX:[id number] is a 48 y.o. male, history of hypertension, diabetes mellitus, kidney stones. Admitted with septic shock, pneumonia, respiratory failure.    Subjective:     Adam Simmons today Symptoms: Spiking fever off and on, still intubated, still having diarrhea. Renal function improving    Objective:     Blood pressure 97/53, pulse 113, temperature 98.2 F (36.8 C), temperature source Foley Temp, resp. rate 29, height 1.727 m (5\' 8" ), weight 117.7 kg (259 lb 7.7 oz), SpO2 97.00%.    General Appearance: Intubated , open eyes.    HEENT: Pallor negative, Anicteric sclera.  Intubated  Lungs:  Bilateral decreased breath sounds  Heart: Normal rate.  S1 and S2.    Chest Wall: Symmetric chest wall expansion.   Abdomen: Abdomen is soft, scaphoid and non-distended. There are no signs of ascites. Bowel sounds are normal. Rectal tube in place  Neurological: Patient is intubated, open eyes    Laboratory And Diagnostic Studies:     Recent Labs   [facility] 05/07/13 0421 05/06/13 0453 05/05/13 0330    WBC 18.39* 19.31* --    HGB 10.4* 10.7* --    HCT 32.8* 33.9* --    PLT 219 253 --    NEUTRO -- -- 83     Recent Labs   Basename 05/07/13 0421 05/06/13 0453    NA 144 144    K 3.2* 3.7    CL 109* 108*    CO2 22 22    BUN 71.3* 67.0*    CREAT 1.9* 2.0*    GLU 207* 210*    CA 8.3* 8.5       Blood cultures:  Gram-positive cocci  Sputum cultures: Candida species      Current Med's:     Current Facility-Administered Medications   Medication Dose Route Frequency   . acetylcysteine  3 mL Nebulization Q4H SCH   . albuterol-ipratropium  3 mL Nebulization Q4H SCH   . amLODIPine  5 mg per OG tube Daily   . cefTRIAXone  2 g Intravenous Q24H SCH   . chlorhexidine  15 mL Mouth/Throat BID   . docusate  100 mg Oral BID   . enoxaparin  30 mg Subcutaneous Daily   . famotidine  20 mg per OG tube Daily   . fluconazole  200 mg Intravenous Q24H SCH   .  insulin aspart  1-12 Units Subcutaneous Q4H   . [COMPLETED] insulin aspart  10 Units Subcutaneous Once   . insulin glargine  20 Units Subcutaneous QHS   . lactobacillus/streoptococcus  1 capsule Oral Daily   . methylprednisolone  20 mg Intravenous QAM   . nystatin  1 application Topical BID   . polyethylene glycol  17 g Oral BID   . potassium chloride  40 mEq Oral Daily   . sodium chloride (PF)  10 mL Intracatheter Daily   . thiamine  100 mg Oral Daily   . [DISCONTINUED] ertapenem  1,000 mg Intravenous Q24H SCH   . [DISCONTINUED] levofloxacin  750 mg Intravenous Q48H             Assessment:      Condition.  Guarded   Septic shock; resolved   Strep pneumoniae sepsis   Gram-positive bacteremia     Respiratory failure, status post intubation   Bilateral pneumonia   Possible aspiration   Diabetes mellitus  Acute kidney injury; off dialysis   Diarrhea     Plan:      Start Rocephin   Start Diflucan   Discontinue   Invanz   Discontinue  Levaquin   Discontinue  vancomycin   Continue ventilatory support   Continue supportive care   Correction of electrolytes   Nephrology follow up   Discussed with wife          Alfonzo Beers, M.D.,FACP  05/07/2013  9:11 AM

## 2013-05-07 NOTE — Progress Notes (Signed)
[facility], [employer].  Progress Note    Assessment:  1.AKI- non-oliguric   2. Pneumonia with ARDS- on ventilator   3. Septic shock off pressors   4. Severe acidosis - combined respiratory and metabolic   5. DM on Metformin   6. HTN   7. Kidney stones   8. Exposure to NSAIDs prior to admission   9. Hypernatremia   10. Fluid overload, past 24 hrs in neg balance      Plan:  -Polyuric with recovering renal function which is stable.Do not anticipate any further dialysis   -Quinton taken out.   -Slightly negative balance which is desired   -continue to follow renal fucntion  - replace electrolytes        Herbie Drape, MD  Office - [phone]  ++++++++++++++++++++++++++++++++++++++++++++++++++++++++++++++  Subjective:  S/p bronch,no acute events    Medications:  Scheduled Meds:  Current Facility-Administered Medications   Medication Dose Route Frequency   . acetylcysteine  3 mL Nebulization Q4H SCH   . albuterol-ipratropium  3 mL Nebulization Q4H SCH   . amLODIPine  5 mg per OG tube Daily   . cefTRIAXone  2 g Intravenous Q24H SCH   . chlorhexidine  15 mL Mouth/Throat BID   . docusate  100 mg Oral BID   . enoxaparin  30 mg Subcutaneous Daily   . famotidine  20 mg per OG tube Daily   . fluconazole  200 mg Intravenous Q24H SCH   . insulin aspart  1-12 Units Subcutaneous Q4H   . [COMPLETED] insulin aspart  10 Units Subcutaneous Once   . insulin glargine  20 Units Subcutaneous QHS   . lactobacillus/streoptococcus  1 capsule Oral Daily   . methylprednisolone  20 mg Intravenous QAM   . nystatin  1 application Topical BID   . polyethylene glycol  17 g Oral BID   . potassium chloride  40 mEq Oral Daily   . sodium chloride (PF)  10 mL Intracatheter Daily   . thiamine  100 mg Oral Daily   . [DISCONTINUED] ertapenem  1,000 mg Intravenous Q24H SCH   . [DISCONTINUED] levofloxacin  750 mg Intravenous Q48H     Continuous Infusions:       . midazolam 7 mg/hr (05/07/13 0720)   . propofol Stopped (05/07/13 1000)      PRN Meds:acetaminophen, albuterol, dextrose, dextrose, glucagon (rDNA), midazolam, morphine, vecuronium, zinc Oxide    Objective:  Vital signs in last 24 hours:  Heart Rate:  [97-196] 113   Resp Rate:  [23-34] 29   BP: (97-136)/(53-74) 97/53 mmHg  FiO2:  [45 %-50 %] 45 %  Intake/Output last 24 hours:    Intake/Output Summary (Last 24 hours) at 05/07/13 1054  Last data filed at 05/07/13 1012   Gross per 24 hour   Intake 3357.3 ml   Output   2975 ml   Net  382.3 ml     Intake/Output this shift:  I/O this shift:  In: 499.7 [I.V.:109.7; NG/GT:390]  Out: -     Physical Exam:   [id number] and sedated   CV: S1 S2 N RRR   Chest: coarse BS bilaterally   Ab: distended,BS present   Ext: anasarca+    Labs:    Lab 05/07/13 0421 05/06/13 0453 05/05/13 0426 05/04/13 0319 05/03/13 0349   GLU 207* 210* 247* -- --   BUN 71.3* 67.0* 67.9* -- --   CREAT 1.9* 2.0* 2.1* -- --   CA 8.3* 8.5 8.5 -- --  NA 144 144 144 -- --   K 3.2* 3.7 4.0 -- --   CL 109* 108* 106 -- --   CO2 22 22 24  -- --   ALB -- -- 1.9* 1.9* 1.9*   PHOS -- 5.8* 5.7* 6.0* --   MG -- 2.1 2.2 2.1 --       Lab 05/07/13 0421 05/06/13 0453 05/05/13 0330   WBC 18.39* 19.31* 23.15*   HGB 10.4* 10.7* 11.5*   HCT 32.8* 33.9* 36.1*   MCV 88.4 89.9 89.4   MCH 28.0 28.4 28.5   MCHC 31.7* 31.6* 31.9*   RDW 16* 16* 16*   MPV 11.4 11.0 10.9   PLT 219 253 284

## 2013-05-07 NOTE — Plan of Care (Signed)
Problem: Potential for Compromised Skin Integrity  Goal: Skin integrity is maintained or improved  Assess and monitor skin integrity. Identify patients at risk for skin breakdown on admission and per policy. Collaborate with interdisciplinary team and initiate plans and interventions as needed.   Outcome: Progressing  Intervention: Turn patient  Pt repositioned Q2hrs. Alternating bed therapy on.   Intervention: Keep skin clean and dry  Nystatin ointment and Desitin applied on groan. Nystatin powered on folds.   Intervention: Monitor patient's hygiene practices  Peri and oral care carried out q4hrs.       Problem: Potential for Compromised Hemodynamic Status  Goal: Stable vital signs and fluid balance  Outcome: Progressing  Intervention: Monitor/assess vitals and hemodynamic parameters with position changes  VSS when repositioned.   Intervention: Encourage oral fluid intake.  Free water flushes given Q4hrs. TF to goal.      Comments:   Continue to monitor.

## 2013-05-07 NOTE — Progress Notes (Signed)
WOC[facility] Wound Progress Note    Reason for Visit:             F/U visit dependent immobility  Sacral lesion  Abrasions at right wrist and bilateral lower legs    Assessment:   Dependent immobility / Intubated and sedated  Resolution of abrasions at wrist and lower legs  Rectal tube, sacrum not assessed  Desitin in use to scrotum and anal cleft    Interventions:  None    Plan:       Maintain pressure ulcer precautions  Continue Desitin to groin   Maintain rectal tube              Lab 05/07/13 0421   WBC 18.39*   HGB 10.4*   HCT 32.8*   PLT 219       Lab 05/07/13 0421 05/05/13 0426   NA 144 --   K 3.2* --   CL 109* --   CO2 22 --   BUN 71.3* --   CREAT 1.9* --   CA 8.3* --   ALB -- 1.9*   PROT -- 6.8   BILITOTAL -- 0.6   ALKPHOS -- 104   ALT -- 33   AST -- 29   GLU 207* --         Maretta Bees, RN  [facility]ON  Pager # [phone]

## 2013-05-07 NOTE — Progress Notes (Signed)
Daily Progress Note    Date Time: 05/07/2013 2:43 PM  Patient Name: Adam Simmons  Attending Physician: Lawernce Ion, MD  Room: IC05/IC05-A   Admit Date: 04/24/2013  LOS: 13 days        Assessment/Plan:   #Neuro: Propofol stopped. Versed drip continued. RASS  -3, Weaned sedation. Grimaces to painful stimuli. Opening eyes to voice.    #Cardio: ST. Febrile - tylenol given.     #Resp: RR greater than 30. MD aware. No new orders given. Chest PT continued.     #GI: rounded, distention- bowel regimen-on hold due to diarrhea. Fecal management device.           #Renal /Fluid, Electrolytes : foley- u/o adequate.      #Endo: accuchecks-coverage given.    #Skin: Desitin applied to peri area.  Nystatin ointment ordered for peri area.     #Nutrition: TFs to goal. Free water given. Prosource given.   #Lines: TLC PICC.     #Prophylaxis:   GI Prophylaxis:  pepcid  VTE Prophylaxis: lovenox and SCDs.    Wife at bedside and updated.        Medications:   Scheduled Meds:  Current Facility-Administered Medications   Medication Dose Route Frequency   . acetylcysteine  3 mL Nebulization Q4H SCH   . albuterol-ipratropium  3 mL Nebulization Q4H SCH   . amLODIPine  5 mg per OG tube Daily   . cefTRIAXone  2 g Intravenous Q24H SCH   . chlorhexidine  15 mL Mouth/Throat BID   . docusate  100 mg Oral BID   . enoxaparin  30 mg Subcutaneous Daily   . famotidine  20 mg per OG tube Daily   . fluconazole  200 mg Intravenous Q24H SCH   . insulin aspart  1-12 Units Subcutaneous Q4H   . insulin glargine  20 Units Subcutaneous QHS   . lactobacillus/streoptococcus  1 capsule Oral Daily   . methylprednisolone  20 mg Intravenous QAM   . nystatin   Topical BID   . nystatin  1 application Topical BID   . polyethylene glycol  17 g Oral BID   . potassium chloride  40 mEq Oral Daily   . sodium chloride (PF)  10 mL Intracatheter Daily   . thiamine  100 mg Oral Daily   . [DISCONTINUED] ertapenem  1,000 mg Intravenous Q24H SCH   . [DISCONTINUED] levofloxacin  750 mg  Intravenous Q48H         Continuous Infusions:       . midazolam 7 mg/hr (05/07/13 0720)   . propofol Stopped (05/07/13 1000)        Data:    Vent Settings:    Vent Settings  Vent Mode: PRVC  FiO2: 45 %  Resp Rate (Set): 20   Vt (Set, mL): 500 mL  PIP Observed (cm H2O): 28 cm H2O  PEEP/EPAP: 5 cm H20  Pressure Support / IPAP: 15 cmH20  Mean Airway Pressure: 15 cmH20        Labs:   Labs (last 72 hours):  Recent Labs   Basename 05/07/13 0421 05/06/13 0453 05/05/13 0330    WBC 18.39* 19.31* 23.15*    HGB 10.4* 10.7* 11.5*    HCT 32.8* 33.9* 36.1*    LABPLAT -- -- --     No results found for this basename: PT:3,INR:3,PTT:3 in the last 72 hours Recent Labs   Basename 05/07/13 0421 05/06/13 0453 05/05/13 0426    NA 144 144 144  K 3.2* 3.7 4.0    CL 109* 108* 106    CO2 22 22 24     BUN 71.3* 67.0* 67.9*    CREAT 1.9* 2.0* 2.1*    GLU 207* 210* 247*    CA 8.3* 8.5 8.5    MG -- 2.1 2.2    PHOS -- 5.8* 5.7*

## 2013-05-07 NOTE — Progress Notes (Signed)
Nutritional Support Services  Nutrition Follow Up    Adam Simmons 48 y.o. male   MRN: [id number]    Nutrition Summary/Diet history:  Pt is vented and sedated.     Nutrition Diagnosis:   Inadequate oral intake related to respiratory failure as evidenced by vent support.    Intervention:  1. Continue with current feeds.     Goal: Meet estimated needs with enteral nutrition support while vented.    Monitoring:   Evaluation:   1. Residuals < 500 mL none  2. Weights   wts fluctuates    Nutrition risk level: Moderate    Assessment Data:  Adm dx:  Respiratory failure   Patient Active Problem List   Diagnosis   . Respiratory failure   . Pneumonia   . Hypokalemia   . ARDS (adult respiratory distress syndrome)   . DM (diabetes mellitus)   . AKI (acute kidney injury)   PMH:  has a past medical history of Hypertension; Diabetes mellitus; and Kidney stones.  PSH:  has past surgical history that includes Kidney stone surgery.  Pertinent labs:   Lab 05/07/13 0421 05/06/13 0453 05/05/13 0426 05/04/13 0319 05/03/13 0349 05/02/13 0529   NA 144 144 144 144 145 --   K 3.2* 3.7 4.0 4.1 4.2 --   CL 109* 108* 106 106 105 --   CO2 22 22 24 25 26  --   BUN 71.3* 67.0* 67.9* 72.6* 74.3* --   CREAT 1.9* 2.0* 2.1* 2.1* 2.3* --   GLU 207* 210* 247* 220* 210* --   CA 8.3* 8.5 8.5 8.2* 8.1* --   MG -- 2.1 2.2 2.1 2.1 2.1   PHOS -- 5.8* 5.7* 6.0* 6.9* 6.9*   EGFR 37.9 35.7 33.7 33.7 30.4 --   Blood glucose range (48 hrs) 160-276 mg/dL  Lab 69/62/95 2841   TRIG 225*   Pertinent meds: chloprhexidine, docusate, famotidine, insulin aspart, insulin glargine, lactobacillus/streoptococcus, methylprednisolone, polyethylene glycol, thiamine, versed, Propofol @ 16.2 mL/hr = 428 kcals/d  Social history:  married  Food Intolerance/Religious/Ethnic preferences: none noted  Diet Order:  Orders Placed This Encounter   Procedures   . Diet NPO effective now   . Prosource no carb Frequency:: All meals; Quantity:: A. One One packet q 6 hrs.   . Tube feeding-CONTINUOUS    Enteral nutrition:   Indication: vented  Route: OG  Frequency: continuous  Formula: Nepro   Rate: 25 mL/hr   Formula provides: 1080 kcals, 48 g pro and  435 mL free water. (TF+ ProSource + Propofol = 1748 kcals/d and 108 g pro/d).  Additives: ProSource NoCarb 1 packet q 6 hrs = 240 kcals and 60 g pro   Water flushes: 300 mL q 4 hrs  Residuals: none   GI symptoms: abd distended, diarrhea  Hydration: non pitting edema  Skin: Stage II sacrum, abrasions  Anthropometrics  Height: 172.7 cm (5\' 8" )  Weight: 117.7 kg (259 lb 7.7 oz)  Weight Change: -0.17   IBW/kg (Calculated) Male: 70.02 kg  IBW/kg (Calculated) Male: 63.62 kg  BMI (calculated): 42.6   Wt Readings from Last 30 Encounters:   05/07/13 117.7 kg (259 lb 7.7 oz)   05/07/13 117.7 kg (259 lb 7.7 oz)   05/07/13 117.7 kg (259 lb 7.7 oz)   Learning Needs: no  Estimated Needs:  Estimated Energy Needs  Total Energy Estimated Needs: 1680-1820 kcals/kg  Method for Estimating Needs: 24-26 kcals/kg IBW  Estimated Protein Needs  Total Protein Estimated Needs: 140 g  pro/kg  Method for Estimating Needs: 2 g protein/kg  Fluid Needs  Method for Estimating Needs: per MD  No Known Allergies    Geryl Councilman, RD, CNSC, CSO  Spectralink - [phone]

## 2013-05-07 NOTE — Progress Notes (Signed)
[facility]- Critical Care Note     ICU Daily Progress Note        Date Time: 05/07/2013 11:21 PM  Patient Name: Adam Simmons  Attending Physician: Lawernce Ion, MD  Room: IC05/IC05-A   Admit Date: 04/24/2013  LOS: 13 days            Assessment:     Patient Active Problem List   Diagnosis   . Respiratory failure   . Pneumonia   . Hypokalemia   . ARDS (adult respiratory distress syndrome)   . DM (diabetes mellitus)   . AKI (acute kidney injury)   Day 13 mechanical ventilation.  Pneumococcal pneumonia/bacteremia  Yeast in sputum  AKI improved  Hypokalemia repleted.    Plan:   Abx, diflucandiurese, weaning trial        Subjective:   Sedated, arousable, on vent  Medications:       Scheduled Meds: PRN Meds:         acetylcysteine 3 mL Nebulization Q4H SCH   albuterol-ipratropium 3 mL Nebulization Q4H SCH   amLODIPine 5 mg per OG tube Daily   cefTRIAXone 2 g Intravenous Q24H SCH   chlorhexidine 15 mL Mouth/Throat BID   docusate 100 mg Oral BID   enoxaparin 30 mg Subcutaneous Daily   famotidine 20 mg per OG tube Daily   fluconazole 200 mg Intravenous Q24H SCH   insulin aspart 1-12 Units Subcutaneous Q4H   insulin glargine 20 Units Subcutaneous QHS   lactobacillus/streoptococcus 1 capsule Oral Daily   methylprednisolone 20 mg Intravenous QAM   nystatin  Topical BID   nystatin 1 application Topical BID   polyethylene glycol 17 g Oral BID   potassium chloride 40 mEq Oral Daily   sodium chloride (PF) 10 mL Intracatheter Daily   thiamine 100 mg Oral Daily   [DISCONTINUED] ertapenem 1,000 mg Intravenous Q24H [facility]   [DISCONTINUED] levofloxacin 750 mg Intravenous Q48H       Continuous Infusions:       . midazolam 5 mg/hr (05/07/13 1736)   . propofol Stopped (05/07/13 1000)         acetaminophen 500 mg Q4H PRN   albuterol 2.5 mg Q1H PRN   dextrose 15 g PRN   dextrose 25 mL PRN   glucagon (rDNA) 1 mg PRN   midazolam 2 mg Q1H PRN   morphine 2 mg Q2H PRN   vecuronium 10 mg Q6H PRN   zinc Oxide  PRN             Physical Exam:      Filed Vitals:    05/07/13 1928 05/07/13 1933 05/07/13 2000 05/07/13 2100   BP:   131/69 130/69   Pulse:  105 108 112   Temp:   100.5 F (38.1 C)    TempSrc:       Resp:  24 29 30    Height:       Weight:       SpO2: 95% 95% 95% 96%     Temp (24hrs), Avg:101.3 F (38.5 C), Min:100.5 F (38.1 C), Max:102.2 F (39 C)           02/15 0701 - 02/16 0700  In: 3556.4 [I.V.:1100.4]  Out: 3575 [Urine:3275]       General Appearance: on vent  Mental status: sedated   Neuro:  Moves ext  H & N: obese  Lungs: rhonchi post   Cardiac: reg  Abdomen:  Spft, bs+  Extremities: trace edema  Skin: warm  Data:       Vent Settings:    Vent Settings  Vent Mode: PRVC  FiO2: 45 %  Resp Rate (Set): 20   Vt (Set, mL): 500 mL  PIP Observed (cm H2O): 26 cm H2O  PEEP/EPAP: 5 cm H20  Pressure Support / IPAP: 15 cmH20  Mean Airway Pressure: 13 cmH20      Labs:     Recent CBC   Lab 05/07/13 0421 05/06/13 0453 05/05/13 0330   WBC 18.39* 19.31* 23.15*   RBC 3.71* 3.77* 4.04*   HGB 10.4* 10.7* 11.5*   HCT 32.8* 33.9* 36.1*   MCV 88.4 89.9 89.4   PLT 219 253 284         Lab 05/07/13 0421 05/06/13 0453 05/05/13 0426 05/04/13 0319 05/03/13 0349   NA 144 144 144 -- --   K 3.2* 3.7 4.0 -- --   CL 109* 108* 106 -- --   CO2 22 22 24  -- --   GLU 207* 210* 247* -- --   BUN 71.3* 67.0* 67.9* -- --   CREAT 1.9* 2.0* 2.1* -- --   MG -- 2.1 2.2 2.1 --   PHOS -- 5.8* 5.7* 6.0* --   AST -- -- 29 34 26   ALT -- -- 33 34 30   ALKPHOS -- -- 104 110 89   BILITOTAL -- -- 0.6 0.5 0.4   BILIDIRECT -- -- -- -- --   LIP -- -- -- -- --   BNP -- -- -- -- --   PT -- -- -- -- --   INR -- -- -- -- --   PTT -- -- -- -- --   DDIMER -- -- -- -- --   CK -- -- -- -- --   CKMB -- -- -- -- --   TROPI -- -- -- -- --   MYOGLOBIN -- -- -- -- --           Rads:     Radiology Results (24 Hour)     ** No Results found for the last 24 hours. **          Radiological Imaging personally reviewed.    I have personally reviewed the patient's history and 24 hour interval events, along with  vitals, labs, radiology images and  ventilator settings and additional findings found in detail within ICU team notes, with their care plans developed with and reviewed by me.         Signed by: Durward Fortes, MD  Date/Time: 05/07/2013 11:21 PM

## 2013-05-07 NOTE — PT Progress Note (Signed)
[facility]  [address]  [place], [place]. [address]    Department of Rehabilitation  [phone]    Physical Therapy Daily Treatment Note    Patient: Adam Simmons    MRN#: [id number]     Time of Treatment: Start Time: 0919 Stop Time: 0943 Time Calculation (min): 24 min    PT Visit Number: 3    Patient's medical condition is appropriate for Physical Therapy intervention at this time.    Precautions and Contraindications: Intubated, fall risk       Assessment: Pt presents with cognitive deficits despite decreased sedation today. Unable to follow commands, track, no response to threat. Will continue to follow as appropriate as pt improves.  Assessment: Decreased UE strength;Decreased LE strength;Decreased cognition;Decreased functional mobility Prognosis: Patient remains in ICU/critical care   Progress: Slow progress, cognitive deficits            Plan:   Continue with Physical Therapy services to address mobility deficits. Focus next session on EOB as appropriate.  Treatment/Interventions: Neuromuscular re-education;Functional transfer training;LE strengthening/ROM;Endurance training;Bed mobility   PT Frequency: 2-3x/wk     Discharge Recommendation:  (TBD if able to wean from vent)      Subjective: Nursing clears patient for therapy. Sedation weaned some yesterday. Pt with eyes open. No tracking noted. No response to name, tactile or verbal stimuli.         Objective:  Observation of Patient/Vital Signs:  Patient is in bed with telemetry, peripheral IV, mechanical ventilation via oral intubation and indwelling urinary catheter, rectal tube in place.    Cognition  Arousal/Alertness: Unresponsive to stimuli  Attention Span: Difficulty attending to directions  Following Commands: Does not follow commands  Insights: Not aware of deficits    Functional Mobility  Supine to Sit: Dependent (Bed placed in chair mode)        Therapeutic Exercise  PROM:  Shoulder flexion/extension Bx5  Elbow flexion/extension  Bx5    Hip/knee flexion Bx5  Hip abd/add Bx5  Hip IR/ER Bx5    Heel cord stretch B 2x30 sec  Piriformis stretch L x30 sec.    Treatment Activities: Therex as above. Bed palced in chair more for pulmonary hygiene and to try to increase pt's responsiveness/awareness. No command following throughout session, requested pt to squeeze fingers, wiggle toes, make facial expressions and assist with ROM. Minimal response to threat with slight blinking. No response to painful stimuli. RR 32-33 throughout session. Other vitals stable as well.    Educated the patient to role of physical therapy, plan of care, goals of therapy and safety with mobility and ADLs.    At end of session pt seated upright in chair mode, all medical equipment intact. RN aware.      Delfin Edis, PT, DPT  Pager #: [phone]

## 2013-05-08 ENCOUNTER — Inpatient Hospital Stay: Payer: BC Managed Care – PPO

## 2013-05-08 LAB — GLUCOSE WHOLE BLOOD - POCT
Whole Blood Glucose POCT: 221 mg/dL — ABNORMAL HIGH (ref 70–100)
Whole Blood Glucose POCT: 260 mg/dL — ABNORMAL HIGH (ref 70–100)
Whole Blood Glucose POCT: 269 mg/dL — ABNORMAL HIGH (ref 70–100)
Whole Blood Glucose POCT: 278 mg/dL — ABNORMAL HIGH (ref 70–100)
Whole Blood Glucose POCT: 293 mg/dL — ABNORMAL HIGH (ref 70–100)
Whole Blood Glucose POCT: 329 mg/dL — ABNORMAL HIGH (ref 70–100)

## 2013-05-08 LAB — CBC
Hematocrit: 33.9 % — ABNORMAL LOW (ref 42.0–52.0)
Hgb: 10.8 g/dL — ABNORMAL LOW (ref 13.0–17.0)
MCH: 28.1 pg (ref 28.0–32.0)
MCHC: 31.9 g/dL — ABNORMAL LOW (ref 32.0–36.0)
MCV: 88.3 fL (ref 80.0–100.0)
MPV: 11.3 fL (ref 9.4–12.3)
Platelets: 221 10*3/uL (ref 140–400)
RBC: 3.84 10*6/uL — ABNORMAL LOW (ref 4.70–6.00)
RDW: 16 % — ABNORMAL HIGH (ref 12–15)
WBC: 17.91 10*3/uL — ABNORMAL HIGH (ref 3.50–10.80)

## 2013-05-08 LAB — BASIC METABOLIC PANEL
Anion Gap: 12 (ref 5.0–15.0)
BUN: 69.3 mg/dL — ABNORMAL HIGH (ref 9.0–21.0)
CO2: 20 mEq/L — ABNORMAL LOW (ref 22–29)
Calcium: 8.6 mg/dL (ref 8.5–10.5)
Chloride: 112 mEq/L — ABNORMAL HIGH (ref 98–107)
Creatinine: 1.8 mg/dL — ABNORMAL HIGH (ref 0.7–1.3)
Glucose: 311 mg/dL — ABNORMAL HIGH (ref 70–100)
Potassium: 3.3 mEq/L — ABNORMAL LOW (ref 3.5–5.1)
Sodium: 144 mEq/L (ref 136–145)

## 2013-05-08 LAB — GFR: EGFR: 40.3

## 2013-05-08 LAB — PHOSPHORUS: Phosphorus: 3.3 mg/dL (ref 2.3–4.7)

## 2013-05-08 LAB — MAGNESIUM: Magnesium: 2 mg/dL (ref 1.6–2.6)

## 2013-05-08 MED ORDER — SODIUM CHLORIDE 0.9 % IV BOLUS
1000.0000 mL | Freq: Once | INTRAVENOUS | Status: AC
Start: 2013-05-08 — End: 2013-05-08
  Administered 2013-05-08: 1000 mL via INTRAVENOUS

## 2013-05-08 MED ORDER — INSULIN ASPART 100 UNIT/ML SC SOLN
10.0000 [IU] | Freq: Once | SUBCUTANEOUS | Status: AC
Start: 2013-05-08 — End: 2013-05-08
  Administered 2013-05-08: 10 [IU] via SUBCUTANEOUS
  Filled 2013-05-08: qty 100

## 2013-05-08 MED ORDER — INSULIN GLARGINE 100 UNIT/ML SC SOLN
30.0000 [IU] | Freq: Every evening | SUBCUTANEOUS | Status: DC
Start: 2013-05-08 — End: 2013-05-09
  Administered 2013-05-08 – 2013-05-09 (×2): 30 [IU] via SUBCUTANEOUS
  Filled 2013-05-08 (×2): qty 300

## 2013-05-08 MED ORDER — SODIUM CHLORIDE 0.9 % IV BOLUS
500.0000 mL | Freq: Once | INTRAVENOUS | Status: DC
Start: 2013-05-08 — End: 2013-05-08

## 2013-05-08 NOTE — Plan of Care (Signed)
Problem: Pain  Goal: Patient's pain/discomfort is manageable  Intervention: Offer non-pharmocological pain management interventions  Pt off sedation, continues to be intubated. Frequently assess pain.  Reposition frequently.       Problem: Tissue integrity  Goal: Damaged tissue is healing and protected  Intervention: If Compromised: Provide wound care per Wound Care algorithm.  Reposition pt every 2 hours or more frequently if needed.   Apply desitin to periarea as needed and with

## 2013-05-08 NOTE — Progress Notes (Signed)
[facility], [employer].  Progress Note    Assessment:  1.AKI- non-oliguric   2. Pneumonia with ARDS- on ventilator   3. Septic shock off pressors   4. Severe acidosis - combined respiratory and metabolic   5. DM on Metformin   6. HTN   7. Kidney stones   8. Exposure to NSAIDs prior to admission   9. Hypernatremia   10. Fluid overload, past 24 hrs in neg balance      Plan:  -Polyuric with recovering renal function which is stable.Do not anticipate any further dialysis   -Quinton taken out.   -Slightly negative balance which is desired   -continue to follow renal fucntion  - replace electrolytes  - Febrile w/u .        Herbie Drape, MD  Office - [phone]  ++++++++++++++++++++++++++++++++++++++++++++++++++++++++++++++  Subjective:  On vent .    Medications:  Scheduled Meds:  Current Facility-Administered Medications   Medication Dose Route Frequency   . acetylcysteine  3 mL Nebulization Q4H SCH   . albuterol-ipratropium  3 mL Nebulization Q4H SCH   . amLODIPine  5 mg per OG tube Daily   . cefTRIAXone  2 g Intravenous Q24H SCH   . chlorhexidine  15 mL Mouth/Throat BID   . docusate  100 mg Oral BID   . enoxaparin  30 mg Subcutaneous Daily   . famotidine  20 mg per OG tube Daily   . fluconazole  200 mg Intravenous Q24H SCH   . insulin aspart  1-12 Units Subcutaneous Q4H   . insulin glargine  20 Units Subcutaneous QHS   . lactobacillus/streoptococcus  1 capsule Oral Daily   . methylprednisolone  20 mg Intravenous QAM   . nystatin   Topical BID   . nystatin  1 application Topical BID   . polyethylene glycol  17 g Oral BID   . potassium chloride  40 mEq Oral Daily   . sodium chloride (PF)  10 mL Intracatheter Daily   . thiamine  100 mg Oral Daily     Continuous Infusions:       . midazolam Stopped (05/08/13 1130)   . propofol Stopped (05/07/13 1000)     PRN Meds:acetaminophen, albuterol, dextrose, dextrose, glucagon (rDNA), midazolam, morphine, vecuronium, zinc Oxide    Objective:  Vital signs in  last 24 hours:  Temp:  [100.5 F (38.1 C)-102.6 F (39.2 C)] 101.8 F (38.8 C)  Heart Rate:  [105-120] 120   Resp Rate:  [24-34] 31   BP: (114-149)/(62-76) 142/73 mmHg  FiO2:  [45 %] 45 %  Intake/Output last 24 hours:    Intake/Output Summary (Last 24 hours) at 05/08/13 1134  Last data filed at 05/08/13 1000   Gross per 24 hour   Intake 2171.5 ml   Output   4300 ml   Net -2128.5 ml     Intake/Output this shift:  I/O this shift:  In: -   Out: 800 [Urine:800]    Physical Exam:   VHQ:IONGEXBMW and sedated   CV: S1 S2 N RRR   Chest: coarse BS bilaterally   Ab: distended,BS present   Ext: anasarca+    Labs:    Lab 05/08/13 0340 05/07/13 0421 05/06/13 0453 05/05/13 0426 05/04/13 0319 05/03/13 0349   GLU 311* 207* 210* -- -- --   BUN 69.3* 71.3* 67.0* -- -- --   CREAT 1.8* 1.9* 2.0* -- -- --   CA 8.6 8.3* 8.5 -- -- --  NA 144 144 144 -- -- --   K 3.3* 3.2* 3.7 -- -- --   CL 112* 109* 108* -- -- --   CO2 20* 22 22 -- -- --   ALB -- -- -- 1.9* 1.9* 1.9*   PHOS 3.3 -- 5.8* 5.7* -- --   MG 2.0 -- 2.1 2.2 -- --       Lab 05/08/13 0340 05/07/13 0421 05/06/13 0453   WBC 17.91* 18.39* 19.31*   HGB 10.8* 10.4* 10.7*   HCT 33.9* 32.8* 33.9*   MCV 88.3 88.4 89.9   MCH 28.1 28.0 28.4   MCHC 31.9* 31.7* 31.6*   RDW 16* 16* 16*   MPV 11.3 11.4 11.0   PLT 221 219 253

## 2013-05-08 NOTE — Progress Notes (Signed)
Patient failed SBT today.  Dr. Discussed with patient's wife about Janina Mayo in case patient fails SBT again.  Wife prefers to have another trial before she ok for pt to have a trach procedure.  CM was present in the room during this discussion and CM will f/u as needed.

## 2013-05-08 NOTE — Progress Notes (Addendum)
[facility]- Critical Care Note     ICU Daily Progress Note        Date Time: 05/08/2013 11:15 AM  Patient Name: Adam Simmons  Attending Physician: Lawernce Ion, MD  Room: IC05/IC05-A   Admit Date: 04/24/2013  LOS: 14 days        Assessment/Plan:   # Neurologic: sedated, not candidate for sedation vacation given tachypnea. Critical care myopathy.     # Respiratory: multilobar Strep Pneumonia pneumonia, resolved ARDS.  Still tachypnic on ventilator. Cont chest physiotherapy and mucomyst. Discussed with patient's wife concern for patient may need trach/peg to allow more time for healing.      # Nutrition: appreciate nutritionist recommendation, cont tube feeds    # Endocrine: Diabetes; uncontrolled, cont high dose sliding-scale insulin.  increase lantus 30 units tonight.    # Cardiac: septic shock resolved. Off pressors. Echo indicated EF 50%. Titrated steroids to off.     # Hematologic: Thrombocytopenia resolved.     #renal: acute kidney injury stable, appreciate Dr. Dalbert Batman group recs. Cont free water bolus q4hrs. Pt is net negative.  Will give gentle bolus given increased glucose.     # Infectious diseases: streptococcus pneumonia bacteremia/ pneumonia.  Appreciate Dr. Myrtis Ser consultation and evaluation. Continue antibiotics    Code Status: full code     I have personally reviewed the patient's history and 24 hour interval events, along with vitals, labs, radiology images, and nurses report.      discussed with patient's wife at the bedside and answered her questions.      Subjective:     48 y.o. male h/o DM type 2, HTN who presents to the hospital with respiratory failure, from streptococcus pneumonia, ARDS, AKI and septic shock.      sedation vacation today, tachypnic on ventilator    Hospital Course  2/3   ARDS / PNA / Sepsis  w/ resp failure- intubated,  Flu test-neg  Resp c+s= gm + cocci , x2BC= gm + cocci resembling strep  2/6 Quinton placed for worsening creatinine, 14 L positive  2/8  dialysis  2/10 failed SBT  2/11 tachypnic, readjusted ventilator tidal volume.   2/13 hemodialysis catheter removed, bronchoscopy with improved pulmonary toilet  2/15 pulmonary toilet, mucomyst, chest PT vest  2/17 fever 102F, cooling blanket  Medications:   Scheduled Meds:  Current Facility-Administered Medications   Medication Dose Route Frequency   . acetylcysteine  3 mL Nebulization Q4H SCH   . albuterol-ipratropium  3 mL Nebulization Q4H SCH   . amLODIPine  5 mg per OG tube Daily   . cefTRIAXone  2 g Intravenous Q24H SCH   . chlorhexidine  15 mL Mouth/Throat BID   . docusate  100 mg Oral BID   . enoxaparin  30 mg Subcutaneous Daily   . famotidine  20 mg per OG tube Daily   . fluconazole  200 mg Intravenous Q24H SCH   . insulin aspart  1-12 Units Subcutaneous Q4H   . insulin glargine  20 Units Subcutaneous QHS   . lactobacillus/streoptococcus  1 capsule Oral Daily   . methylprednisolone  20 mg Intravenous QAM   . nystatin   Topical BID   . nystatin  1 application Topical BID   . polyethylene glycol  17 g Oral BID   . potassium chloride  40 mEq Oral Daily   . sodium chloride (PF)  10 mL Intracatheter Daily   . thiamine  100 mg Oral Daily  Continuous Infusions:       . midazolam 5 mg/hr (05/08/13 0653)   . propofol Stopped (05/07/13 1000)          Physical Exam:     Filed Vitals:    05/08/13 0900   BP: 139/71   Pulse: 117   Temp:    Resp: 31   SpO2: 95%         Intake/Output Summary (Last 24 hours) at 05/08/13 1115  Last data filed at 05/08/13 1000   Gross per 24 hour   Intake 2171.5 ml   Output   4300 ml   Net -2128.5 ml     General Appearance: obese, off sedation but sleepy, intubated   Mental status: off sedation but sleepy  Neuro: awake, diffusely weak, intubated   Neck: supple   Lungs: bilateral rhonchi anteriorly, no wheeze  Cardiac: ns1, ns2, no m/r/g, tachycardic   Abdomen: Obese, soft, nontender, hypoactive bowel sounds   Extremities: trace lower extremity edema   Skin:  has a triangular mark on  right forearm area that is scabbed over, has abrasion on left forearm, has grease on his hands, has abrasions on his sacral area, has various bruising on his upper extremities.  Anasarca improved.    Data:       Invasive ICU Hemodynamics:    Invasive Hemodynamic Monitoring  CVP (mmHg): 22 mmHg  CO (L/min): 10.4 L/min  CI (L/min/m2): 4.3 L/min/m2    Art Line  Arterial Line BP: 139/70 mmHg  Arterial Line MAP (mmHg): 89 mmHg  Arterial Line Location: Left radial  Art Line Wave Form: Appropriate wave forms      Vent Settings:    Vent Settings  Vent Mode: PRVC  FiO2: 45 %  Resp Rate (Set): 20   Vt (Set, mL): 500 mL  PIP Observed (cm H2O): 25 cm H2O  PEEP/EPAP: 5 cm H20  Pressure Support / IPAP: 15 cmH20  Mean Airway Pressure: 14 cmH20        Labs:       Labs (last 72 hours):  Recent Labs   Basename 05/08/13 0340 05/07/13 0421 05/06/13 0453    WBC 17.91* 18.39* 19.31*    HGB 10.8* 10.4* 10.7*    HCT 33.9* 32.8* 33.9*    LABPLAT -- -- --     No results found for this basename: PT:3,INR:3,PTT:3 in the last 72 hours Recent Labs   Basename 05/08/13 0340 05/07/13 0421 05/06/13 0453    NA 144 144 144    K 3.3* 3.2* 3.7    CL 112* 109* 108*    CO2 20* 22 22    BUN 69.3* 71.3* 67.0*    CREAT 1.8* 1.9* 2.0*    GLU 311* 207* 210*    CA 8.6 8.3* 8.5    MG 2.0 -- 2.1    PHOS 3.3 -- 5.8*                     Rads:   Radiological Imaging personally reviewed,and agree with radiology report including:       CXR  05/08/2013  Significantly improved infiltrates compared to 04/30/2013      I have personally reviewed the patient's history and 24 hour interval events, along with vitals, labs, radiology images and nursing.         Signed by: Lawernce Ion, MD  Date/Time: 05/08/2013 11:15 AM

## 2013-05-08 NOTE — Progress Notes (Signed)
Infectious Disease            Progress Note    05/08/2013   Adam Simmons ZOX:[id number] is a 48 y.o. male, history of hypertension, diabetes mellitus, kidney stones. Admitted with septic shock, pneumonia, respiratory failure.    Subjective:     Adam Simmons today Symptoms: still spiking fevers,  intubated,  having diarrhea. Renal function improving    Objective:     Blood pressure 128/72, pulse 111, temperature 101.8 F (38.8 C), temperature source Foley Temp, resp. rate 26, height 1.727 m (5\' 8" ), weight 117.7 kg (259 lb 7.7 oz), SpO2 95.00%.    General Appearance: Intubated , open eyes.    HEENT: Pallor negative, Anicteric sclera.  Intubated  Lungs:  Bilateral decreased breath sounds  Heart: Normal rate.  S1 and S2.    Chest Wall: Symmetric chest wall expansion.   Abdomen: Abdomen is soft, scaphoid and non-distended. There are no signs of ascites. Bowel sounds are normal. Rectal tube in place  Neurological: Patient is intubated, open eyes    Laboratory And Diagnostic Studies:     Recent Labs   [facility] 05/08/13 0340 05/07/13 0421    WBC 17.91* 18.39*    HGB 10.8* 10.4*    HCT 33.9* 32.8*    PLT 221 219    NEUTRO -- --     Recent Labs   [facility] 05/08/13 0340 05/07/13 0421    NA 144 144    K 3.3* 3.2*    CL 112* 109*    CO2 20* 22    BUN 69.3* 71.3*    CREAT 1.8* 1.9*    GLU 311* 207*    CA 8.6 8.3*       Blood cultures:  Gram-positive cocci  Sputum cultures: Candida species      Current Med's:     Current Facility-Administered Medications   Medication Dose Route Frequency   . acetylcysteine  3 mL Nebulization Q4H SCH   . albuterol-ipratropium  3 mL Nebulization Q4H SCH   . amLODIPine  5 mg per OG tube Daily   . cefTRIAXone  2 g Intravenous Q24H SCH   . chlorhexidine  15 mL Mouth/Throat BID   . docusate  100 mg Oral BID   . enoxaparin  30 mg Subcutaneous Daily   . famotidine  20 mg per OG tube Daily   . fluconazole  200 mg Intravenous Q24H SCH   . insulin aspart  1-12 Units Subcutaneous Q4H    . insulin glargine  20 Units Subcutaneous QHS   . lactobacillus/streoptococcus  1 capsule Oral Daily   . methylprednisolone  20 mg Intravenous QAM   . nystatin   Topical BID   . nystatin  1 application Topical BID   . polyethylene glycol  17 g Oral BID   . potassium chloride  40 mEq Oral Daily   . sodium chloride (PF)  10 mL Intracatheter Daily   . thiamine  100 mg Oral Daily             Assessment:      Condition.  Guarded   Septic shock; resolved   Strep pneumoniae sepsis   Coagulase-negative staph bacteremia     Respiratory failure, status post intubation   Bilateral pneumonia   Possible aspiration   Diabetes mellitus   Acute kidney injury; off dialysis   Diarrhea     Plan:      Continue Rocephin   Continue  Diflucan   Stool for C. difficile  Continue ventilatory support   Continue supportive care   Correction of electrolytes   Nephrology follow up   Discussed with wife   Discussed with Dr. Roxan Hockey, M.D.,FACP  05/08/2013  9:17 AM

## 2013-05-09 LAB — CBC
Hematocrit: 33.9 % — ABNORMAL LOW (ref 42.0–52.0)
Hgb: 10.6 g/dL — ABNORMAL LOW (ref 13.0–17.0)
MCH: 27.4 pg — ABNORMAL LOW (ref 28.0–32.0)
MCHC: 31.3 g/dL — ABNORMAL LOW (ref 32.0–36.0)
MCV: 87.6 fL (ref 80.0–100.0)
MPV: 11.4 fL (ref 9.4–12.3)
Platelets: 223 10*3/uL (ref 140–400)
RBC: 3.87 10*6/uL — ABNORMAL LOW (ref 4.70–6.00)
RDW: 15 % (ref 12–15)
WBC: 16.9 10*3/uL — ABNORMAL HIGH (ref 3.50–10.80)

## 2013-05-09 LAB — BASIC METABOLIC PANEL
Anion Gap: 10 (ref 5.0–15.0)
BUN: 60.9 mg/dL — ABNORMAL HIGH (ref 9.0–21.0)
CO2: 22 mEq/L (ref 22–29)
Calcium: 8.7 mg/dL (ref 8.5–10.5)
Chloride: 117 mEq/L — ABNORMAL HIGH (ref 98–107)
Creatinine: 1.3 mg/dL (ref 0.7–1.3)
Glucose: 282 mg/dL — ABNORMAL HIGH (ref 70–100)
Potassium: 3.2 mEq/L — ABNORMAL LOW (ref 3.5–5.1)
Sodium: 149 mEq/L — ABNORMAL HIGH (ref 136–145)

## 2013-05-09 LAB — GLUCOSE WHOLE BLOOD - POCT
Whole Blood Glucose POCT: 296 mg/dL — ABNORMAL HIGH (ref 70–100)
Whole Blood Glucose POCT: 263 mg/dL — ABNORMAL HIGH (ref 70–100)
Whole Blood Glucose POCT: 278 mg/dL — ABNORMAL HIGH (ref 70–100)
Whole Blood Glucose POCT: 267 mg/dL — ABNORMAL HIGH (ref 70–100)
Whole Blood Glucose POCT: 308 mg/dL — ABNORMAL HIGH (ref 70–100)
Whole Blood Glucose POCT: 231 mg/dL — ABNORMAL HIGH (ref 70–100)

## 2013-05-09 LAB — GFR: EGFR: 58.7

## 2013-05-09 LAB — MAGNESIUM: Magnesium: 1.8 mg/dL (ref 1.6–2.6)

## 2013-05-09 LAB — PHOSPHORUS: Phosphorus: 3.2 mg/dL (ref 2.3–4.7)

## 2013-05-09 MED ORDER — FLUCONAZOLE IN SODIUM CHLORIDE 400-0.9 MG/200ML-% IV SOLN
400.0000 mg | INTRAVENOUS | Status: DC
Start: 2013-05-10 — End: 2013-05-11
  Administered 2013-05-10: 400 mg via INTRAVENOUS
  Filled 2013-05-09 (×2): qty 200

## 2013-05-09 MED ORDER — SODIUM CHLORIDE 0.9 % IV SOLN
4.0000 [IU]/h | INTRAVENOUS | Status: DC
Start: 2013-05-09 — End: 2013-05-10
  Administered 2013-05-09: 4 [IU]/h via INTRAVENOUS
  Filled 2013-05-09: qty 1

## 2013-05-09 MED ORDER — LINEZOLID 2 MG/ML IV SOLN
600.0000 mg | Freq: Two times a day (BID) | INTRAVENOUS | Status: DC
Start: 2013-05-09 — End: 2013-05-11
  Administered 2013-05-09 – 2013-05-11 (×4): 600 mg via INTRAVENOUS
  Filled 2013-05-09 (×4): qty 300

## 2013-05-09 MED ORDER — POTASSIUM CHLORIDE 20 MEQ/15ML (10%) PO LIQD UD CUP
40.0000 meq | Freq: Two times a day (BID) | ORAL | Status: DC
Start: 2013-05-09 — End: 2013-05-10
  Administered 2013-05-09: 40 meq via ORAL
  Filled 2013-05-09: qty 30

## 2013-05-09 NOTE — Progress Notes (Signed)
Infectious Disease            Progress Note    05/09/2013   Adam Simmons [id number] is a 48 y.o. male, history of hypertension, diabetes mellitus, kidney stones. Admitted with septic shock, pneumonia, respiratory failure.    Subjective:     Adam Simmons today Symptoms: Still some fevers fevers,  intubated,  having diarrhea, rectal tube in place. Renal function improved nicely. Adam Simmons    Objective:     Blood pressure 127/74, pulse 99, temperature 101.8 F (38.8 C), temperature source Foley Temp, resp. rate 23, height 1.727 m (5\' 8" ), weight 117.7 kg (259 lb 7.7 oz), SpO2 96.00%.    General Appearance: Intubated , open eyes.    HEENT: Pallor negative, Anicteric sclera.  Intubated  Lungs:  Bilateral decreased breath sounds  Heart: Normal rate.  S1 and S2.    Chest Wall: Symmetric chest wall expansion.   Abdomen: Abdomen is soft, scaphoid and non-distended. There are no signs of ascites. Bowel sounds are normal. Rectal tube in place  Neurological: Patient is intubated, open eyes, somewhat responsive    Laboratory And Diagnostic Studies:     Recent Labs   Adam Simmons 05/09/13 0333 05/08/13 0340    WBC 16.90* 17.91*    HGB 10.6* 10.8*    HCT 33.9* 33.9*    PLT 223 221    NEUTRO -- --     Recent Labs   Adam Simmons 05/09/13 0333 05/08/13 0340    NA 149* 144    K 3.2* 3.3*    CL 117* 112*    CO2 22 20*    BUN 60.9* 69.3*    CREAT 1.3 1.8*    GLU 282* 311*    CA 8.7 8.6       Blood cultures:  Coagulase-negative staph  Sputum cultures: Candida species      Current Med's:     Current Facility-Administered Medications   Medication Dose Route Frequency   . [EXPIRED] acetylcysteine  3 mL Nebulization Q4H SCH   . albuterol-ipratropium  3 mL Nebulization Q4H SCH   . amLODIPine  5 mg per OG tube Daily   . cefTRIAXone  2 g Intravenous Q24H SCH   . chlorhexidine  15 mL Mouth/Throat BID   . docusate  100 mg Oral BID   . enoxaparin  30 mg Subcutaneous Daily   . famotidine  20 mg per OG tube Daily   . fluconazole  200  mg Intravenous Q24H SCH   . insulin aspart  1-12 Units Subcutaneous Q4H   . [COMPLETED] insulin aspart  10 Units Subcutaneous Once   . insulin glargine  30 Units Subcutaneous QHS   . lactobacillus/streoptococcus  1 capsule Oral Daily   . nystatin   Topical BID   . nystatin  1 application Topical BID   . polyethylene glycol  17 g Oral BID   . potassium chloride  40 mEq Oral Daily   . sodium chloride (PF)  10 mL Intracatheter Daily   . [COMPLETED] sodium chloride  1,000 mL Intravenous Once   . thiamine  100 mg Oral Daily   . [DISCONTINUED] insulin glargine  20 Units Subcutaneous QHS   . [DISCONTINUED] methylprednisolone  20 mg Intravenous QAM   . [DISCONTINUED] sodium chloride  500 mL Intravenous Once             Assessment:      Condition.  Guarded   Septic shock; resolved   Strep pneumoniae sepsis   Coagulase-negative staph  bacteremia     Respiratory failure, status post intubation   Bilateral pneumonia   Possible aspiration   Diabetes mellitus   Acute kidney injury; off dialysis   Diarrhea; C. Difficile negative     Plan:      Continue Rocephin   Continue  Diflucan   Continue ventilatory support   Continue supportive care   Correction of electrolytes   Nephrology follow up          Adam Simmons, M.D.,FACP  05/09/2013  8:38 AM

## 2013-05-09 NOTE — Progress Notes (Signed)
[facility], [employer].  Progress Note    Assessment:  1.AKI- non-oliguric   2. Pneumonia with ARDS- on ventilator   3. Septic shock off pressors   4. Severe acidosis - combined respiratory and metabolic   5. DM on Metformin   6. HTN   7. Kidney stones   8. Exposure to NSAIDs prior to admission   9. Hypernatremia   10. Fluid overload, past 24 hrs in neg balance      Plan:  -Polyuric with recovering renal function which is stable.Do not anticipate any further dialysis   -Quinton taken out.   -Slightly negative balance which is desired   -continue to follow renal fucntion  - replace electrolytes  - Febrile w/u .  - Agree with changing his feeding.        Adam Drape, MD  Office - [phone]  ++++++++++++++++++++++++++++++++++++++++++++++++++++++++++++++  Subjective:  On vent .    Medications:  Scheduled Meds:  Current Facility-Administered Medications   Medication Dose Route Frequency   . [EXPIRED] acetylcysteine  3 mL Nebulization Q4H SCH   . albuterol-ipratropium  3 mL Nebulization Q4H SCH   . amLODIPine  5 mg per OG tube Daily   . cefTRIAXone  2 g Intravenous Q24H SCH   . chlorhexidine  15 mL Mouth/Throat BID   . docusate  100 mg Oral BID   . enoxaparin  30 mg Subcutaneous Daily   . famotidine  20 mg per OG tube Daily   . fluconazole  200 mg Intravenous Q24H SCH   . insulin aspart  1-12 Units Subcutaneous Q4H   . [COMPLETED] insulin aspart  10 Units Subcutaneous Once   . insulin glargine  30 Units Subcutaneous QHS   . lactobacillus/streoptococcus  1 capsule Oral Daily   . nystatin   Topical BID   . nystatin  1 application Topical BID   . polyethylene glycol  17 g Oral BID   . potassium chloride  40 mEq Oral Daily   . sodium chloride (PF)  10 mL Intracatheter Daily   . [COMPLETED] sodium chloride  1,000 mL Intravenous Once   . thiamine  100 mg Oral Daily   . [DISCONTINUED] insulin glargine  20 Units Subcutaneous QHS   . [DISCONTINUED] methylprednisolone  20 mg Intravenous QAM   .  [DISCONTINUED] sodium chloride  500 mL Intravenous Once     Continuous Infusions:       . midazolam Stopped (05/08/13 1130)   . propofol 20 mcg/kg/min (05/09/13 0710)     PRN Meds:acetaminophen, albuterol, dextrose, dextrose, glucagon (rDNA), midazolam, morphine, vecuronium, zinc Oxide    Objective:  Vital signs in last 24 hours:  Temp:  [97.2 F (36.2 C)] 97.2 F (36.2 C)  Heart Rate:  [96-125] 104   Resp Rate:  [23-38] 28   BP: (122-149)/(69-80) 131/74 mmHg  FiO2:  [45 %] 45 %  Intake/Output last 24 hours:    Intake/Output Summary (Last 24 hours) at 05/09/13 1101  Last data filed at 05/09/13 1049   Gross per 24 hour   Intake 4306.5 ml   Output   4200 ml   Net  106.5 ml     Intake/Output this shift:  I/O this shift:  In: 642 [I.V.:156; NG/GT:486]  Out: 300 [Urine:300]    Physical Exam:   GNF:[id number] and sedated   CV: S1 S2 N RRR   Chest: coarse BS bilaterally   Ab: distended,BS present   Ext: anasarca+    Labs:  Lab 05/09/13 8295 05/08/13 0340 05/07/13 0421 05/06/13 0453 05/05/13 0426 05/04/13 0319 05/03/13 0349   GLU 282* 311* 207* -- -- -- --   BUN 60.9* 69.3* 71.3* -- -- -- --   CREAT 1.3 1.8* 1.9* -- -- -- --   CA 8.7 8.6 8.3* -- -- -- --   NA 149* 144 144 -- -- -- --   K 3.2* 3.3* 3.2* -- -- -- --   CL 117* 112* 109* -- -- -- --   CO2 22 20* 22 -- -- -- --   ALB -- -- -- -- 1.9* 1.9* 1.9*   PHOS 3.2 3.3 -- 5.8* -- -- --   MG 1.8 2.0 -- 2.1 -- -- --       Lab 05/09/13 0333 05/08/13 0340 05/07/13 0421   WBC 16.90* 17.91* 18.39*   HGB 10.6* 10.8* 10.4*   HCT 33.9* 33.9* 32.8*   MCV 87.6 88.3 88.4   MCH 27.4* 28.1 28.0   MCHC 31.3* 31.9* 31.7*   RDW 15 16* 16*   MPV 11.4 11.3 11.4   PLT 223 221 219

## 2013-05-09 NOTE — Progress Notes (Signed)
Upon shift assessment, pt temp 39.2C, HR 130s.  Warm to the touch.  Sheet removed.  Cooling blanket reapplied. Dr. Lesle Reek made aware.  Orders received for IV insulin gtt, started at 4u/hr. Q1hour BS initiated.  NS started per order set at 139ml/hr.  Blood cultures obtained.  Zyvox ordered and administered. Will continue to monitor.

## 2013-05-09 NOTE — Progress Notes (Signed)
Dr. Stan Head with eICU made aware of AM labs. Pt receiving free water. Pt has hx of dialysis this admission. No replacement orders received at this time. Will continue to monitor.

## 2013-05-09 NOTE — Progress Notes (Signed)
Nutritional Support Services  Nutrition Follow Up    Adam Simmons 48 y.o. male   MRN: [id number]    Nutrition Summary/Diet history:  Pt is vented and sedated.  Note that K+, mag, phosphorus and creatinine WNL.    Nutrition Diagnosis:   Inadequate oral intake related to respiratory failure as evidenced by vent support.    Intervention:  1. Suggest MD considers changing TF to Promote to goal 50 mL/hr = 1200 kcals, 75 g pro and 1006 mL free water  2. Add ProSource NoCarb 1 packet q 6 hrs = 240 kcals and 60 g pro/d. (TF+ ProSource + Propofol = 1867 kcals/d)     Goal: Meet estimated needs with enteral nutrition support while vented.    Monitoring:   Evaluation:   1. Residuals < 500 mL < 180 mL  2. Weights   - 16.7 kg since admission  3. Net I&O   - 1.5 L since admission    Nutrition risk level: High    Assessment Data:  Adm dx:  Respiratory failure   Patient Active Problem List   Diagnosis   . Respiratory failure   . Pneumonia   . Hypokalemia   . ARDS (adult respiratory distress syndrome)   . DM (diabetes mellitus)   . AKI (acute kidney injury)   . Pneumococcal sepsis   PMH:  has a past medical history of Hypertension; Diabetes mellitus; and Kidney stones.  PSH:  has past surgical history that includes Kidney stone surgery.  Pertinent labs:   Lab 05/09/13 0333 05/08/13 0340 05/07/13 0421 05/06/13 0453 05/05/13 0426 05/04/13 0319   NA 149* 144 144 144 144 --   K 3.2* 3.3* 3.2* 3.7 4.0 --   CL 117* 112* 109* 108* 106 --   CO2 22 20* 22 22 24  --   BUN 60.9* 69.3* 71.3* 67.0* 67.9* --   CREAT 1.3 1.8* 1.9* 2.0* 2.1* --   GLU 282* 311* 207* 210* 247* --   CA 8.7 8.6 8.3* 8.5 8.5 --   MG 1.8 2.0 -- 2.1 2.2 2.1   PHOS 3.2 3.3 -- 5.8* 5.7* 6.0*   EGFR 58.7 40.3 37.9 35.7 33.7 --   Blood glucose range (48 hrs) 221-329 mg/dL    Lab 98/11/91 4782   TRIG 225*   Pertinent meds: chloprhexidine, docusate, famotidine, insulin aspart, insulin glargine, lactobacillus/streoptococcus, methylprednisolone, polyethylene glycol, thiamine,  versed, Propofol @ 16.2 mL/hr = 428 kcals/d  Social history:  married  Food Intolerance/Religious/Ethnic preferences: none noted  Diet Order:  Orders Placed This Encounter   Procedures   . Diet NPO effective now   . Prosource no carb Frequency:: All meals; Quantity:: A. One One packet q 6 hrs.   . Tube feeding-CONTINUOUS   Enteral nutrition:   Indication: vented  Route: OG  Frequency: continuous  Formula: Nepro   Rate: 25 mL/hr   Formula provides: 1080 kcals, 48 g pro and  435 mL free water. (TF+ ProSource + Propofol = 1748 kcals/d and 108 g pro/d).  Additives: ProSource NoCarb 1 packet q 6 hrs = 240 kcals and 60 g pro   Water flushes: 300 mL q 4 hrs  Residuals: none   GI symptoms: abd distended, diarrhea  Hydration: non pitting edema  Skin: Stage II sacrum, abrasions  Anthropometrics  Height: 172.7 cm (5\' 8" )  Weight: 117.7 kg (259 lb 7.7 oz)  Weight Change: -0.17   IBW/kg (Calculated) Male: 70.02 kg  IBW/kg (Calculated) Male: 63.62 kg  BMI (calculated): 42.6  Wt Readings from Last 30 Encounters:   05/07/13 117.7 kg (259 lb 7.7 oz)   05/07/13 117.7 kg (259 lb 7.7 oz)   05/07/13 117.7 kg (259 lb 7.7 oz)   Learning Needs: no  Estimated Needs:  Estimated Energy Needs  Total Energy Estimated Needs: 1680-1820 kcals/kg  Method for Estimating Needs: 24-26 kcals/kg IBW  Estimated Protein Needs  Total Protein Estimated Needs: 140 g pro/kg  Method for Estimating Needs: 2 g protein/kg  Fluid Needs  Method for Estimating Needs: per MD  No Known Allergies    Geryl Councilman, RD, CNSC, CSO  Spectralink - [phone]

## 2013-05-10 LAB — BASIC METABOLIC PANEL
Anion Gap: 12 (ref 5.0–15.0)
Anion Gap: 11 (ref 5.0–15.0)
BUN: 62.3 mg/dL — ABNORMAL HIGH (ref 9.0–21.0)
BUN: 51.5 mg/dL — ABNORMAL HIGH (ref 9.0–21.0)
CO2: 21 mEq/L — ABNORMAL LOW (ref 22–29)
CO2: 22 mEq/L (ref 22–29)
Calcium: 8.7 mg/dL (ref 8.5–10.5)
Calcium: 8.3 mg/dL — ABNORMAL LOW (ref 8.5–10.5)
Chloride: 121 mEq/L — ABNORMAL HIGH (ref 98–107)
Chloride: 115 mEq/L — ABNORMAL HIGH (ref 98–107)
Creatinine: 1.3 mg/dL (ref 0.7–1.3)
Creatinine: 1.2 mg/dL (ref 0.7–1.3)
Glucose: 232 mg/dL — ABNORMAL HIGH (ref 70–100)
Glucose: 282 mg/dL — ABNORMAL HIGH (ref 70–100)
Potassium: 3.5 mEq/L (ref 3.5–5.1)
Potassium: 3.5 mEq/L (ref 3.5–5.1)
Sodium: 154 mEq/L — ABNORMAL HIGH (ref 136–145)
Sodium: 148 mEq/L — ABNORMAL HIGH (ref 136–145)

## 2013-05-10 LAB — GLUCOSE WHOLE BLOOD - POCT
Whole Blood Glucose POCT: 166 mg/dL — ABNORMAL HIGH (ref 70–100)
Whole Blood Glucose POCT: 175 mg/dL — ABNORMAL HIGH (ref 70–100)
Whole Blood Glucose POCT: 187 mg/dL — ABNORMAL HIGH (ref 70–100)
Whole Blood Glucose POCT: 187 mg/dL — ABNORMAL HIGH (ref 70–100)
Whole Blood Glucose POCT: 192 mg/dL — ABNORMAL HIGH (ref 70–100)
Whole Blood Glucose POCT: 194 mg/dL — ABNORMAL HIGH (ref 70–100)
Whole Blood Glucose POCT: 196 mg/dL — ABNORMAL HIGH (ref 70–100)
Whole Blood Glucose POCT: 196 mg/dL — ABNORMAL HIGH (ref 70–100)
Whole Blood Glucose POCT: 202 mg/dL — ABNORMAL HIGH (ref 70–100)
Whole Blood Glucose POCT: 207 mg/dL — ABNORMAL HIGH (ref 70–100)
Whole Blood Glucose POCT: 208 mg/dL — ABNORMAL HIGH (ref 70–100)
Whole Blood Glucose POCT: 209 mg/dL — ABNORMAL HIGH (ref 70–100)
Whole Blood Glucose POCT: 216 mg/dL — ABNORMAL HIGH (ref 70–100)
Whole Blood Glucose POCT: 218 mg/dL — ABNORMAL HIGH (ref 70–100)
Whole Blood Glucose POCT: 224 mg/dL — ABNORMAL HIGH (ref 70–100)
Whole Blood Glucose POCT: 229 mg/dL — ABNORMAL HIGH (ref 70–100)
Whole Blood Glucose POCT: 241 mg/dL — ABNORMAL HIGH (ref 70–100)
Whole Blood Glucose POCT: 243 mg/dL — ABNORMAL HIGH (ref 70–100)
Whole Blood Glucose POCT: 254 mg/dL — ABNORMAL HIGH (ref 70–100)
Whole Blood Glucose POCT: 266 mg/dL — ABNORMAL HIGH (ref 70–100)
Whole Blood Glucose POCT: 282 mg/dL — ABNORMAL HIGH (ref 70–100)
Whole Blood Glucose POCT: 295 mg/dL — ABNORMAL HIGH (ref 70–100)
Whole Blood Glucose POCT: 308 mg/dL — ABNORMAL HIGH (ref 70–100)
Whole Blood Glucose POCT: 311 mg/dL — ABNORMAL HIGH (ref 70–100)

## 2013-05-10 LAB — CBC
Hematocrit: 36.8 % — ABNORMAL LOW (ref 42.0–52.0)
Hgb: 11.5 g/dL — ABNORMAL LOW (ref 13.0–17.0)
MCH: 28.4 pg (ref 28.0–32.0)
MCHC: 31.3 g/dL — ABNORMAL LOW (ref 32.0–36.0)
MCV: 90.9 fL (ref 80.0–100.0)
MPV: 11.7 fL (ref 9.4–12.3)
Platelets: 211 10*3/uL (ref 140–400)
RBC: 4.05 10*6/uL — ABNORMAL LOW (ref 4.70–6.00)
RDW: 16 % — ABNORMAL HIGH (ref 12–15)
WBC: 17.76 10*3/uL — ABNORMAL HIGH (ref 3.50–10.80)

## 2013-05-10 LAB — PHOSPHORUS: Phosphorus: 3.7 mg/dL (ref 2.3–4.7)

## 2013-05-10 LAB — MAGNESIUM: Magnesium: 1.9 mg/dL (ref 1.6–2.6)

## 2013-05-10 LAB — GFR
EGFR: 58.7
EGFR: >60

## 2013-05-10 MED ORDER — DEXTROSE 50 % IV SOLN
50.0000 mL | INTRAVENOUS | Status: DC | PRN
Start: 2013-05-10 — End: 2013-05-15

## 2013-05-10 MED ORDER — SUCRALFATE 1 GM/10ML PO SUSP
1.0000 g | Freq: Four times a day (QID) | ORAL | Status: DC
Start: 2013-05-10 — End: 2013-05-22
  Administered 2013-05-10 – 2013-05-22 (×45): 1 g via ORAL
  Filled 2013-05-10 (×47): qty 10

## 2013-05-10 MED ORDER — SUCRALFATE 1 G PO TABS
1.0000 g | ORAL_TABLET | Freq: Four times a day (QID) | ORAL | Status: DC
Start: 2013-05-10 — End: 2013-05-10

## 2013-05-10 MED ORDER — SODIUM CHLORIDE 0.45 % IV SOLN
INTRAVENOUS | Status: DC
Start: 2013-05-10 — End: 2013-05-10

## 2013-05-10 MED ORDER — SODIUM CHLORIDE 0.9 % IV SOLN
8.0000 mg/h | INTRAVENOUS | Status: DC
Start: 2013-05-10 — End: 2013-05-11
  Administered 2013-05-10 – 2013-05-11 (×3): 8 mg/h via INTRAVENOUS
  Filled 2013-05-10 (×4): qty 80

## 2013-05-10 MED ORDER — ONDANSETRON HCL 4 MG/2ML IJ SOLN
4.0000 mg | Freq: Three times a day (TID) | INTRAMUSCULAR | Status: DC | PRN
Start: 2013-05-10 — End: 2013-05-23
  Administered 2013-05-10 – 2013-05-22 (×2): 4 mg via INTRAVENOUS
  Filled 2013-05-10 (×2): qty 2

## 2013-05-10 MED ORDER — SODIUM PHOSPHATE 3 MMOLE/ML IV SOLN
15.0000 mmol | INTRAVENOUS | Status: DC | PRN
Start: 2013-05-10 — End: 2013-05-23

## 2013-05-10 MED ORDER — SODIUM PHOSPHATE 3 MMOLE/ML IV SOLN
35.0000 mmol | INTRAVENOUS | Status: DC | PRN
Start: 2013-05-10 — End: 2013-05-23

## 2013-05-10 MED ORDER — INSULIN REGULAR HUMAN 100 UNIT/ML IJ SOLN
0.0000 [IU] | Freq: Once | INTRAMUSCULAR | Status: AC
Start: 2013-05-10 — End: 2013-05-10
  Administered 2013-05-10: 3 [IU] via INTRAVENOUS
  Filled 2013-05-10: qty 9

## 2013-05-10 MED ORDER — DEXTROSE 5 % IV SOLN
INTRAVENOUS | Status: DC
Start: 2013-05-10 — End: 2013-05-10
  Administered 2013-05-10: 150 mL/h via INTRAVENOUS

## 2013-05-10 MED ORDER — SODIUM PHOSPHATE 3 MMOLE/ML IV SOLN
25.0000 mmol | INTRAVENOUS | Status: DC | PRN
Start: 2013-05-10 — End: 2013-05-23

## 2013-05-10 MED ORDER — SODIUM CHLORIDE 0.9 % IV SOLN
0.0000 [IU]/h | INTRAVENOUS | Status: DC
Start: 2013-05-10 — End: 2013-05-15
  Administered 2013-05-10: 3 [IU]/h via INTRAVENOUS
  Administered 2013-05-10: 8 [IU]/h via INTRAVENOUS
  Administered 2013-05-11: 11 [IU]/h via INTRAVENOUS
  Administered 2013-05-11: 7 [IU]/h via INTRAVENOUS
  Administered 2013-05-12: 4.5 [IU]/h via INTRAVENOUS
  Administered 2013-05-12: 11 [IU]/h via INTRAVENOUS
  Administered 2013-05-13: 9.5 [IU]/h via INTRAVENOUS
  Administered 2013-05-13: 6.5 [IU]/h via INTRAVENOUS
  Administered 2013-05-14: 2.5 [IU]/h via INTRAVENOUS
  Filled 2013-05-10 (×7): qty 1

## 2013-05-10 MED ORDER — DEXTROSE 5 % IV SOLN
INTRAVENOUS | Status: DC
Start: 2013-05-10 — End: 2013-05-11
  Administered 2013-05-11: 150 mL/h via INTRAVENOUS

## 2013-05-10 MED ORDER — POTASSIUM CHLORIDE 20 MEQ/50ML IV SOLN
20.0000 meq | INTRAVENOUS | Status: DC | PRN
Start: 2013-05-10 — End: 2013-05-23
  Administered 2013-05-11 – 2013-05-19 (×10): 20 meq via INTRAVENOUS
  Filled 2013-05-10 (×2): qty 50
  Filled 2013-05-10: qty 150
  Filled 2013-05-10 (×6): qty 50

## 2013-05-10 MED ORDER — MAGNESIUM SULFATE IN D5W 10-5 MG/ML-% IV SOLN
1.0000 g | INTRAVENOUS | Status: DC | PRN
Start: 2013-05-10 — End: 2013-05-23
  Administered 2013-05-15 – 2013-05-17 (×6): 1 g via INTRAVENOUS
  Filled 2013-05-10: qty 100
  Filled 2013-05-10: qty 200
  Filled 2013-05-10 (×3): qty 100

## 2013-05-10 MED ORDER — PANTOPRAZOLE SODIUM 40 MG IV SOLR
40.0000 mg | Freq: Every day | INTRAVENOUS | Status: DC
Start: 2013-05-10 — End: 2013-05-10
  Administered 2013-05-10: 40 mg via INTRAVENOUS
  Filled 2013-05-10: qty 40

## 2013-05-10 NOTE — Progress Notes (Signed)
Infectious Disease            Progress Note    05/10/2013   Adam Simmons ZOX:[id number] is a 48 y.o. male, history of hypertension, diabetes mellitus, kidney stones. Admitted with septic shock, pneumonia, respiratory failure.    Subjective:     Adam Simmons today Symptoms: Started on Zyvox concerning fevers while on Rocephin. Still fevers,  intubated,  having diarrhea, rectal tube in place. Renal function improved nicely. Him    Objective:     Blood pressure 126/75, pulse 98, temperature 98.7 F (37.1 C), temperature source Temporal Artery, resp. rate 22, height 1.727 m (5\' 8" ), weight 117.7 kg (259 lb 7.7 oz), SpO2 98.00%.    General Appearance: Intubated , open eyes.    HEENT: Pallor negative, Anicteric sclera.  Intubated  Lungs:  Bilateral decreased breath sounds  Heart: Normal rate.  S1 and S2.    Chest Wall: Symmetric chest wall expansion.   Abdomen: Abdomen is soft, scaphoid and non-distended. There are no signs of ascites. Bowel sounds are normal. Rectal tube in place  Neurological: Patient is intubated, open eyes, somewhat responsive    Laboratory And Diagnostic Studies:     Recent Labs   [facility] 05/10/13 0311 05/09/13 0333    WBC 17.76* 16.90*    HGB 11.5* 10.6*    HCT 36.8* 33.9*    PLT 211 223    NEUTRO -- --     Recent Labs   [facility] 05/10/13 0311 05/09/13 0333    NA 154* 149*    K 3.5 3.2*    CL 121* 117*    CO2 21* 22    BUN 62.3* 60.9*    CREAT 1.3 1.3    GLU 232* 282*    CA 8.7 8.7       Blood cultures:  Coagulase-negative staph  Sputum cultures: Candida species      Current Med's:     Current Facility-Administered Medications   Medication Dose Route Frequency   . albuterol-ipratropium  3 mL Nebulization Q4H SCH   . amLODIPine  5 mg per OG tube Daily   . cefTRIAXone  2 g Intravenous Q24H SCH   . chlorhexidine  15 mL Mouth/Throat BID   . docusate  100 mg Oral BID   . fluconazole  400 mg Intravenous Q24H SCH   . lactobacillus/streoptococcus  1 capsule Oral Daily   . linezolid   600 mg Intravenous Q12H   . nystatin   Topical BID   . nystatin  1 application Topical BID   . pantoprazole  40 mg Intravenous Daily   . polyethylene glycol  17 g Oral BID   . sodium chloride (PF)  10 mL Intracatheter Daily   . thiamine  100 mg Oral Daily   . [DISCONTINUED] enoxaparin  30 mg Subcutaneous Daily   . [DISCONTINUED] famotidine  20 mg per OG tube Daily   . [DISCONTINUED] fluconazole  200 mg Intravenous Q24H SCH   . [DISCONTINUED] insulin aspart  1-12 Units Subcutaneous Q4H   . [DISCONTINUED] insulin glargine  30 Units Subcutaneous QHS   . [DISCONTINUED] potassium chloride  40 mEq Oral Daily   . [DISCONTINUED] potassium chloride  40 mEq Oral BID             Assessment:      Condition.  Guarded   Septic shock; resolved   Strep pneumoniae sepsis   Coagulase-negative staph bacteremia     Respiratory failure, status post intubation   Bilateral pneumonia  Possible aspiration   Diabetes mellitus   Acute kidney injury; off dialysis   Diarrhea; C. Difficile negative     Plan:      Continue Rocephin   Continue Zyvox for now   Continue  Diflucan   Continue ventilatory support   Continue supportive care   Correction of electrolytes   Nephrology follow up   Possible trach and PEG on Monday   Discussed with Dr.Mendiguren and Dr. Roxan Hockey, M.D.,FACP  05/10/2013  9:22 AM

## 2013-05-10 NOTE — Progress Notes (Signed)
[facility] [employer].  Progress Note    Assessment:  1.AKI- non-oliguric   2. Pneumonia with ARDS- on ventilator   3. Septic shock off pressors   4. Severe acidosis - combined respiratory and metabolic   5. DM on Metformin   6. HTN   7. Kidney stones   8. Exposure to NSAIDs prior to admission   9. Hypernatremia   10. Fluid overload, past 24 hrs in neg balance      Plan:  -Polyuric with recovering renal function which is stable.Do not anticipate any further dialysis   -Quinton taken out.   -Slightly negative balance which is desired   -continue to follow renal fucntion  - replace electrolytes  - Febrile w/u .  - Agree with changing his feeding  - IV D5W to get Na lower.        Adam Drape, MD  Office - [phone]  ++++++++++++++++++++++++++++++++++++++++++++++++++++++++++++++  Subjective:  On vent .    Medications:  Scheduled Meds:  Current Facility-Administered Medications   Medication Dose Route Frequency   . albuterol-ipratropium  3 mL Nebulization Q4H SCH   . amLODIPine  5 mg per OG tube Daily   . cefTRIAXone  2 g Intravenous Q24H SCH   . chlorhexidine  15 mL Mouth/Throat BID   . fluconazole  400 mg Intravenous Q24H SCH   . [COMPLETED] insulin regular  0-7 Units Intravenous Once   . lactobacillus/streoptococcus  1 capsule Oral Daily   . linezolid  600 mg Intravenous Q12H   . nystatin   Topical BID   . nystatin  1 application Topical BID   . sodium chloride (PF)  10 mL Intracatheter Daily   . sucralfate  1 g Oral Q6H   . [DISCONTINUED] docusate  100 mg Oral BID   . [DISCONTINUED] enoxaparin  30 mg Subcutaneous Daily   . [DISCONTINUED] famotidine  20 mg per OG tube Daily   . [DISCONTINUED] fluconazole  200 mg Intravenous Q24H SCH   . [DISCONTINUED] insulin aspart  1-12 Units Subcutaneous Q4H   . [DISCONTINUED] insulin glargine  30 Units Subcutaneous QHS   . [DISCONTINUED] pantoprazole  40 mg Intravenous Daily   . [DISCONTINUED] polyethylene glycol  17 g Oral BID   . [DISCONTINUED]  potassium chloride  40 mEq Oral BID   . [DISCONTINUED] sucralfate  1 g Oral TID AC & HS   . [DISCONTINUED] thiamine  100 mg Oral Daily     Continuous Infusions:       . dextrose 150 mL/hr at 05/10/13 1313   . insulin (regular) infusion 5 Units/hr (05/10/13 1339)   . pantoprozole (PROTONIX) infusion 8 mg/hr (05/10/13 1035)   . propofol 20 mcg/kg/min (05/10/13 0981)   . [DISCONTINUED] sodium chloride 150 mL/hr at 05/10/13 0533   . [DISCONTINUED] dextrose 150 mL/hr (05/10/13 0650)   . [DISCONTINUED] insulin (regular) infusion 3 Units/hr (05/10/13 0503)   . [DISCONTINUED] midazolam Stopped (05/08/13 1130)     PRN Meds:acetaminophen, albuterol, dextrose, dextrose, dextrose, glucagon (rDNA), midazolam, morphine, ondansetron, zinc Oxide, [DISCONTINUED] vecuronium    Objective:  Vital signs in last 24 hours:  Temp:  [98.7 F (37.1 C)] 98.7 F (37.1 C)  Heart Rate:  [95-131] 101   Resp Rate:  [20-34] 23   BP: (119-144)/(70-109) 123/72 mmHg  FiO2:  [40 %-45 %] 40 %  Intake/Output last 24 hours:    Intake/Output Summary (Last 24 hours) at 05/10/13 1354  Last data filed at 05/10/13 1000  Gross per 24 hour   Intake 3000.1 ml   Output   3650 ml   Net -649.9 ml     Intake/Output this shift:  I/O this shift:  In: -   Out: 500 [Urine:500]    Physical Exam:   SAY:[id number] and sedated   CV: S1 S2 N RRR   Chest: coarse BS bilaterally   Ab: distended,BS present   Ext: anasarca+    Labs:    Lab 05/10/13 0319 05/10/13 0311 05/09/13 0333 05/08/13 0340 05/05/13 0426 05/04/13 0319   GLU -- 232* 282* 311* -- --   BUN -- 62.3* 60.9* 69.3* -- --   CREAT -- 1.3 1.3 1.8* -- --   CA -- 8.7 8.7 8.6 -- --   NA -- 154* 149* 144 -- --   K -- 3.5 3.2* 3.3* -- --   CL -- 121* 117* 112* -- --   CO2 -- 21* 22 20* -- --   ALB -- -- -- -- 1.9* 1.9*   PHOS 3.7 -- 3.2 3.3 -- --   MG 1.9 -- 1.8 2.0 -- --       Lab 05/10/13 0311 05/09/13 0333 05/08/13 0340   WBC 17.76* 16.90* 17.91*   HGB 11.5* 10.6* 10.8*   HCT 36.8* 33.9* 33.9*   MCV 90.9 87.6 88.3   MCH  28.4 27.4* 28.1   MCHC 31.3* 31.3* 31.9*   RDW 16* 15 16*   MPV 11.7 11.4 11.3   PLT 211 223 221

## 2013-05-10 NOTE — OT Progress Note (Signed)
[facility]  [address]  [place], [place]. [address]    Department of Rehabilitation Services  [phone]    Occupational Therapy Treatment Note       Patient:  Adam Simmons MRN#:  [id number]  INTENSIVE CARE [place] IC05/IC05-[id number]    Time of treatment: Start Time: 1155 Stop Time: 1220   Time Calculation (min): 25 min         Precautions and Contraindications:  Falls Risk       Assessment:Patient presents with: decreased strength;decreased ROM;decreased independence with ADLs;balance deficits;decreased cognition;decreased attention;decreased safety awareness. Profound weakness due to prolonged hospitalization on ventilator. Patient with poor command following with sedation lowered. Increased alertness from previous therapy sessions.  Prognosis: Patient has multiple medical complication;Patient remains in ICU/critical care  Progress: Slow progress, medical status limitations    Patient Goal  Patient Goal:  (No stated goals at this time)      Plan: Continue with Occupational therapy services in acute care to address increasing B/L UE AROM, command following. Focus next therapy session on B/L UE AROM in prep. for simple grooming tasks.      OT Plan  Treatment Interventions: ADL retraining;Functional transfer training;UE strengthening/ROM;Endurance training;Patient/Family training;Fine motor coordination activities;Neuro muscular reeducation  Discharge Recommendation:  (TBD)  OT Frequency Recommended: monitor status  OT - Next Visit Recommendation: 05/14/13       ADL Goals  Patient will groom self: Not met     Neuro Re-Ed Goals  Pt will sit at edge of bed: not met  Musculoskeletal Goals  Pt will increase AROM: not met  --Continue with all OT's goals established at time of Evaluation.                     Discharge Recommendation:  (TBD)           Subjective: Patient's medical condition is appropriate for Occupational Therapy intervention at this time.  Patient is unable to indicate agreement for the therapy  session but is able to participate in the selected activities. Nursing clears patient for therapy.RN Lynden Ang reports Patient currently on minimal sedation and reports he is not able to move B/L UE and LEs presently.       Objective:Observation of Patient/Vital Signs:  Patient is in bed with telemetry, OG tube, peripheral IV, mechanical ventilation via oral intubation, indwelling foley catheter and rectal tube in place.    Cognition  Arousal/Alertness: Inconsistent responses to stimuli  Attention Span: Difficulty attending to directions  Orientation Level: Unable to assess  Following Commands: patient followed commands to "Scrunch nose" and "open eyes wide" x 1 during the session; trace R elbow F noted in gravity eliminated position upon command.  Insights: Not aware of deficits         Self-care and Home Management  Grooming:  (Dependent at present time due to no active functional mvmts noted B/L UEs to engage in tasks)    Therapeutic Exercises:Therapeutic Exercises:Patient provided with following UB ROM exercises to faciltate increased endurance and strength for ADL's:  Shoulder PROM: Flexion;Extension 1 x 5 reps  Shoulder PROM; Horizontal Abd/Adduction 1 x 5 reps  Elbow PROM: Flexion;Extension 1 x 5 reps  Wrist PROM: Flexion;Extension 1 x 5 reps  Digits PROM Flexion/Extension 1 x 5 reps  --verbal/verbal/tactile instructions provided however no active responses noted except trace R elbow Flexion in gravity elimin. Position.         Vision - Complex Assessment  Head Position:  (turned to the Right with R  sided visual gaze; made no effort to bring head to midline despite verbal/tactile instructions)  Tracking: Unable to test secondary to decreased visual attention; unable to track therapist's finger  Additional Comments:  (Blinks to threat x 1)                   Treatment Activities: Patient provided with B/L UE PROM therapeutic exercises as noted above. No family present at bedside for education. Positioned B/L UEs on  pillow at end of session in effort to decrease edema.     Educated the patient to role of occupational therapy, plan of care, goals of therapy.    Patient left without needs and call bell within reach.  RN notified of session outcome.        Tennis Ship. Trixie Deis, MS,OTR/L  Pager # [phone]  [phone]

## 2013-05-10 NOTE — Progress Notes (Signed)
[facility]- Critical Care Note     ICU Daily Progress Note        Date Time: 05/10/2013 11:16 AM  Patient Name: Adam Simmons  Attending Physician: Lawernce Ion, MD  Room: IC05/IC05-A   Admit Date: 04/24/2013  LOS: 16 days        Assessment/Plan:   # Neurologic: sedation vacation. Critical care myopathy. Diffuse generalized weakness.     # Respiratory: multilobar Strep Pneumonia pneumonia, resolved ARDS.  Still tachypnic on ventilator. Cont chest physiotherapy and mucomyst. Failed multiple episodes of SBT, also global weakness. Plan for trach/peg Monday by Dr. Germaine Pomfret.  Pt's wife agrees to above plan.    # Nutrition: appreciate nutritionist recommendation, cont tube feeds    # Endocrine: Diabetes; uncontrolled, cont insulin gtt    # Cardiac: septic shock resolved. Off pressors. Echo indicated EF 50%. Titrated steroids to off.     # Hematologic: Thrombocytopenia resolved.     #renal: acute kidney injury stable, appreciate Dr. Dalbert Batman group recs. Cont free water bolus q4hrs. Cont d5W.     # Infectious diseases: streptococcus pneumonia bacteremia/ pneumonia.  Appreciate Dr. Myrtis Ser consultation and evaluation. Continue antibiotics    Code Status: full code     I have personally reviewed the patient's history and 24 hour interval events, along with vitals, labs, radiology images, and nurses report.      discussed with patient's wife at the bedside and answered her questions.      Subjective:     48 y.o. male h/o DM type 2, HTN who presents to the hospital with respiratory failure, from streptococcus pneumonia, ARDS, AKI and septic shock.      sedation vacation today, global weakness    Hospital Course  2/3   ARDS / PNA / Sepsis  w/ resp failure- intubated,  Flu test-neg  Resp c+s= gm + cocci , x2BC= gm + cocci resembling strep  2/6 [place] placed for worsening creatinine, 14 L positive  2/8 dialysis  2/10 failed SBT  2/11 tachypnic, readjusted ventilator tidal volume.   2/13 hemodialysis catheter  removed, bronchoscopy with improved pulmonary toilet  2/15 pulmonary toilet, mucomyst, chest PT vest  2/17 fever 102F, cooling blanket    Medications:   Scheduled Meds:  Current Facility-Administered Medications   Medication Dose Route Frequency   . albuterol-ipratropium  3 mL Nebulization Q4H SCH   . amLODIPine  5 mg per OG tube Daily   . cefTRIAXone  2 g Intravenous Q24H SCH   . chlorhexidine  15 mL Mouth/Throat BID   . docusate  100 mg Oral BID   . fluconazole  400 mg Intravenous Q24H SCH   . lactobacillus/streoptococcus  1 capsule Oral Daily   . linezolid  600 mg Intravenous Q12H   . nystatin   Topical BID   . nystatin  1 application Topical BID   . polyethylene glycol  17 g Oral BID   . sodium chloride (PF)  10 mL Intracatheter Daily   . thiamine  100 mg Oral Daily   . [DISCONTINUED] enoxaparin  30 mg Subcutaneous Daily   . [DISCONTINUED] famotidine  20 mg per OG tube Daily   . [DISCONTINUED] fluconazole  200 mg Intravenous Q24H SCH   . [DISCONTINUED] insulin aspart  1-12 Units Subcutaneous Q4H   . [DISCONTINUED] insulin glargine  30 Units Subcutaneous QHS   . [DISCONTINUED] pantoprazole  40 mg Intravenous Daily   . [DISCONTINUED] potassium chloride  40 mEq Oral BID  Continuous Infusions:       . dextrose 150 mL/hr (05/10/13 0650)   . insulin (regular) infusion 3 Units/hr (05/10/13 0503)   . midazolam Stopped (05/08/13 1130)   . pantoprozole (PROTONIX) infusion 8 mg/hr (05/10/13 1035)   . propofol 20 mcg/kg/min (05/10/13 1610)   . [DISCONTINUED] sodium chloride 150 mL/hr at 05/10/13 0533          Physical Exam:     Filed Vitals:    05/10/13 0811   BP:    Pulse:    Temp:    Resp:    SpO2: 98%         Intake/Output Summary (Last 24 hours) at 05/10/13 1116  Last data filed at 05/10/13 1000   Gross per 24 hour   Intake 3000.1 ml   Output   3650 ml   Net -649.9 ml     General Appearance: obese, off sedation, able to track, intubated   Mental status: off sedation but sleepy  Neuro: awake, diffusely weak,  intubated, not able to move any of his extremities.   Neck: supple   Lungs: bilateral rhonchi anteriorly, no wheeze  Cardiac: ns1, ns2, no m/r/g, tachycardic   Abdomen: Obese, soft, nontender, hypoactive bowel sounds   Extremities: no lower extremity edema   Skin:  has a triangular mark on right forearm area that is scabbed over, has abrasion on left forearm, has grease on his hands, has abrasions on his sacral area, has various bruising on his upper extremities.      Data:       Invasive ICU Hemodynamics:    Invasive Hemodynamic Monitoring  CVP (mmHg): 22 mmHg  CO (L/min): 10.4 L/min  CI (L/min/m2): 4.3 L/min/m2    Art Line  Arterial Line BP: 139/70 mmHg  Arterial Line MAP (mmHg): 89 mmHg  Arterial Line Location: Left radial  Art Line Wave Form: Appropriate wave forms      Vent Settings:    Vent Settings  Vent Mode: PRVC  FiO2: 40 %  Resp Rate (Set): 20   Vt (Set, mL): 500 mL  PIP Observed (cm H2O): 20 cm H2O  PEEP/EPAP: 5 cm H20  Pressure Support / IPAP: 10 cmH20  Mean Airway Pressure: 10 cmH20        Labs:       Labs (last 72 hours):  Recent Labs   Basename 05/10/13 0311 05/09/13 0333 05/08/13 0340    WBC 17.76* 16.90* 17.91*    HGB 11.5* 10.6* 10.8*    HCT 36.8* 33.9* 33.9*    LABPLAT -- -- --     No results found for this basename: PT:3,INR:3,PTT:3 in the last 72 hours Recent Labs   Basename 05/10/13 0319 05/10/13 0311 05/09/13 0333 05/08/13 0340    NA -- 154* 149* 144    K -- 3.5 3.2* 3.3*    CL -- 121* 117* 112*    CO2 -- 21* 22 20*    BUN -- 62.3* 60.9* 69.3*    CREAT -- 1.3 1.3 1.8*    GLU -- 232* 282* 311*    CA -- 8.7 8.7 8.6    MG 1.9 -- 1.8 2.0    PHOS 3.7 -- 3.2 3.3                     Rads:   Radiological Imaging personally reviewed,and agree with radiology report including:       CXR  05/08/2013  Significantly improved infiltrates compared to 04/30/2013  I have personally reviewed the patient's history and 24 hour interval events, along with vitals, labs, radiology images and nursing.         Signed  by: Lawernce Ion, MD  Date/Time: 05/10/2013 11:16 AM

## 2013-05-10 NOTE — Progress Notes (Signed)
Patient failed SBT vent weaning today.  Dr. Nedra Hai discussed with patient's wife about Adam Simmons and PEG procedures and pt's wife agreed for patient to have these surgical procedures and go to the vent facility, [facility] after the surgery.  MD has consulted surgeon to evaluation pt for surgery and procedure will be done on Monday.      Case Manager met with patient's wife, Adam Simmons, in the ICU and provided information about vent facilities, including [facility] in [place], [place]., [facility] in [place] and [facility] in [place] area.  [name] verbalized that she prefers patient to go to a facility in [place], if possible.   CM explained that any vent facility pt's insurance has to approve the services and facility has to be in the network.  CM explained to pt's wife that CM  will send a referral to the [place] facilities also, but transportation approval from pt's insurance might be a problem. CM will f/u with the PTS ambulance company to give an estimate price for ambulance (ALS) with vent transfer to the [place] facility. CM will f/u as needed.     [facility] referral created as discussed with wife and CM will f/u as needed.

## 2013-05-10 NOTE — Progress Notes (Signed)
Dr. Laverda Sorenson with eICU made aware of Na level this am. Change IV fluids to D5W at 150. Notified of insulin gtt currently at 1u/hr. Repeat Na Level at 1500. Will monitor.

## 2013-05-10 NOTE — Consults (Signed)
This is a obese 48 year old male with a history of diabetes, hypertension, sleep apnea who is had a prolonged course on ventilator  For strep pneumonia/sepsis/resolving ARDS. Patient is a failure to wean. Consulted for tracheostomy and PEG tube placement. Discussed risks benefit scenario with patient's wife in detail. Patient tentatively schedule for Monday.

## 2013-05-10 NOTE — PT Progress Note (Signed)
[facility]  [address]  [place], [place]. [address]    Department of Rehabilitation  [phone]    Physical Therapy Daily Treatment Note    Patient: Adam Simmons    MRN#: [id number]     Time of Treatment: Start Time: 1252 Stop Time: 1325 Time Calculation (min): 33 min    PT Visit Number: 4    Patient's medical condition is appropriate for Physical Therapy intervention at this time.    Precautions and Contraindications: Fall risk       Assessment: Pt more alert during session but not following commands. Vitals stable throughout session. RR mid 20s throughout which is improved from previous sessions. Anticipate global weakness due to prolonged hospitalization and intubation.   Assessment: Decreased LE strength;Decreased cognition;Decreased endurance/activity tolerance;Decreased functional mobility Prognosis: Patient remains in ICU/critical care   Progress: Slow progress, decreased activity tolerance            Plan:   Continue with Physical Therapy services to address mobility deficits. Focus next session on therex, EOB as appropriate.  Treatment/Interventions: Functional transfer training;LE strengthening/ROM;Endurance training;Bed mobility;Exercise;Neuromuscular re-education;Cognitive reorientation   PT Frequency: 2-3x/wk     Discharge Recommendation:  (TBD)      Subjective: Nursing clears patient for therapy. Eyes open throughout session. Some tracking noted.          Objective:  Observation of Patient/Vital Signs:  Patient is in bed with telemetry, OG tube, peripheral IV, mechanical ventilation via oral intubation and indwelling urinary catheter, rectal tube in place.    Cognition  Arousal/Alertness: Inconsistent responses to stimuli  Attention Span: Difficulty attending to directions  Orientation Level: Unable to assess  Following Commands: Does not follow commands  Insights: Not aware of deficits    Functional Mobility  Scooting to [facility]: Dependent  Rolling: to L x1 for positioning, dependent         Therapeutic Exercise  PROM as below  Hip/knee flexion Bx5  Hip abd/add Bx5  Hip IR/ER Bx5, log roll. Grimaced with pain with hooklying IR/ER RLE  Ankle DF/PF Bx5-10    Heel cord stretch B x30 sec  Piriformis stretch unable to perform R due to pain. L x30 sec     Treatment Activities: PROM performed as above. Pt with eyes open throughout session. Some tracking noted to voice and when therapist would move from one side of bed to the other.  Unable to track finger to command. Did not follow commands for facial expressions or to squeeze fingers. Wiggled toes B to command. Grimaced with pain and withdrew slightly RLE during ROM. Inconsistent nodding of yes/no throughout session. Slight reaction to threat. PROM of neck, pt grimaced with pain. Tightness noted, pt prefers head turn toward R side. Discussed proper positioning with RN.     Educated the patient to role of physical therapy, plan of care, goals of therapy and HEP.    At end of session pt propped toward L for pressure relief, towel roll behind neck to promote L head turn. BUE elevated. SCDs and sundance boots donned.      Delfin Edis, PT, DPT  Pager #: [phone]

## 2013-05-11 LAB — GLUCOSE WHOLE BLOOD - POCT
Whole Blood Glucose POCT: 107 mg/dL — ABNORMAL HIGH (ref 70–100)
Whole Blood Glucose POCT: 109 mg/dL — ABNORMAL HIGH (ref 70–100)
Whole Blood Glucose POCT: 109 mg/dL — ABNORMAL HIGH (ref 70–100)
Whole Blood Glucose POCT: 117 mg/dL — ABNORMAL HIGH (ref 70–100)
Whole Blood Glucose POCT: 123 mg/dL — ABNORMAL HIGH (ref 70–100)
Whole Blood Glucose POCT: 127 mg/dL — ABNORMAL HIGH (ref 70–100)
Whole Blood Glucose POCT: 130 mg/dL — ABNORMAL HIGH (ref 70–100)
Whole Blood Glucose POCT: 137 mg/dL — ABNORMAL HIGH (ref 70–100)
Whole Blood Glucose POCT: 138 mg/dL — ABNORMAL HIGH (ref 70–100)
Whole Blood Glucose POCT: 145 mg/dL — ABNORMAL HIGH (ref 70–100)
Whole Blood Glucose POCT: 146 mg/dL — ABNORMAL HIGH (ref 70–100)
Whole Blood Glucose POCT: 153 mg/dL — ABNORMAL HIGH (ref 70–100)
Whole Blood Glucose POCT: 156 mg/dL — ABNORMAL HIGH (ref 70–100)
Whole Blood Glucose POCT: 163 mg/dL — ABNORMAL HIGH (ref 70–100)
Whole Blood Glucose POCT: 184 mg/dL — ABNORMAL HIGH (ref 70–100)
Whole Blood Glucose POCT: 192 mg/dL — ABNORMAL HIGH (ref 70–100)
Whole Blood Glucose POCT: 209 mg/dL — ABNORMAL HIGH (ref 70–100)
Whole Blood Glucose POCT: 210 mg/dL — ABNORMAL HIGH (ref 70–100)
Whole Blood Glucose POCT: 222 mg/dL — ABNORMAL HIGH (ref 70–100)
Whole Blood Glucose POCT: 232 mg/dL — ABNORMAL HIGH (ref 70–100)
Whole Blood Glucose POCT: 236 mg/dL — ABNORMAL HIGH (ref 70–100)
Whole Blood Glucose POCT: 255 mg/dL — ABNORMAL HIGH (ref 70–100)
Whole Blood Glucose POCT: 259 mg/dL — ABNORMAL HIGH (ref 70–100)

## 2013-05-11 LAB — BASIC METABOLIC PANEL
Anion Gap: 11 (ref 5.0–15.0)
Anion Gap: 9 (ref 5.0–15.0)
BUN: 47.6 mg/dL — ABNORMAL HIGH (ref 9.0–21.0)
BUN: 42.7 mg/dL — ABNORMAL HIGH (ref 9.0–21.0)
CO2: 21 mEq/L — ABNORMAL LOW (ref 22–29)
CO2: 21 mEq/L — ABNORMAL LOW (ref 22–29)
Calcium: 8 mg/dL — ABNORMAL LOW (ref 8.5–10.5)
Calcium: 8.2 mg/dL — ABNORMAL LOW (ref 8.5–10.5)
Chloride: 110 mEq/L — ABNORMAL HIGH (ref 98–107)
Chloride: 108 mEq/L — ABNORMAL HIGH (ref 98–107)
Creatinine: 1 mg/dL (ref 0.7–1.3)
Creatinine: 1 mg/dL (ref 0.7–1.3)
Glucose: 113 mg/dL — ABNORMAL HIGH (ref 70–100)
Glucose: 118 mg/dL — ABNORMAL HIGH (ref 70–100)
Potassium: 3.2 mEq/L — ABNORMAL LOW (ref 3.5–5.1)
Potassium: 3.6 mEq/L (ref 3.5–5.1)
Sodium: 142 mEq/L (ref 136–145)
Sodium: 138 mEq/L (ref 136–145)

## 2013-05-11 LAB — CBC
Hematocrit: 33.4 % — ABNORMAL LOW (ref 42.0–52.0)
Hgb: 10.7 g/dL — ABNORMAL LOW (ref 13.0–17.0)
MCH: 28.2 pg (ref 28.0–32.0)
MCHC: 32 g/dL (ref 32.0–36.0)
MCV: 88.1 fL (ref 80.0–100.0)
MPV: 11.6 fL (ref 9.4–12.3)
Platelets: 159 10*3/uL (ref 140–400)
RBC: 3.79 10*6/uL — ABNORMAL LOW (ref 4.70–6.00)
RDW: 16 % — ABNORMAL HIGH (ref 12–15)
WBC: 18.05 10*3/uL — ABNORMAL HIGH (ref 3.50–10.80)

## 2013-05-11 LAB — GFR
EGFR: >60
EGFR: >60

## 2013-05-11 LAB — POTASSIUM: Potassium: 3.5 mEq/L (ref 3.5–5.1)

## 2013-05-11 LAB — MAGNESIUM: Magnesium: 1.6 mg/dL (ref 1.6–2.6)

## 2013-05-11 LAB — PHOSPHORUS: Phosphorus: 3.3 mg/dL (ref 2.3–4.7)

## 2013-05-11 MED ORDER — MAGNESIUM SULFATE IN D5W 10-5 MG/ML-% IV SOLN
1.0000 g | Freq: Once | INTRAVENOUS | Status: AC
Start: 2013-05-11 — End: 2013-05-12
  Administered 2013-05-11: 1 g via INTRAVENOUS
  Filled 2013-05-11: qty 100

## 2013-05-11 MED ORDER — SODIUM CHLORIDE 0.45 % IV SOLN
INTRAVENOUS | Status: DC
Start: 2013-05-11 — End: 2013-05-19

## 2013-05-11 MED ORDER — PANTOPRAZOLE SODIUM 40 MG IV SOLR
40.0000 mg | Freq: Every day | INTRAVENOUS | Status: DC
Start: 2013-05-11 — End: 2013-05-19
  Administered 2013-05-11 – 2013-05-19 (×8): 40 mg via INTRAVENOUS
  Filled 2013-05-11 (×8): qty 40

## 2013-05-11 MED ORDER — METHYLPREDNISOLONE SODIUM SUCC 40 MG IJ SOLR
40.0000 mg | Freq: Once | INTRAMUSCULAR | Status: AC
Start: 2013-05-11 — End: 2013-05-11
  Administered 2013-05-11: 40 mg via INTRAVENOUS
  Filled 2013-05-11: qty 1

## 2013-05-11 MED ORDER — POTASSIUM CHLORIDE 20 MEQ/15ML (10%) PO LIQD UD CUP
20.0000 meq | Freq: Once | ORAL | Status: AC
Start: 2013-05-11 — End: 2013-05-11
  Administered 2013-05-11: 20 meq via ORAL
  Filled 2013-05-11: qty 15

## 2013-05-11 MED ORDER — LINEZOLID 600 MG PO TABS
600.0000 mg | ORAL_TABLET | Freq: Two times a day (BID) | ORAL | Status: DC
Start: 2013-05-11 — End: 2013-05-15
  Administered 2013-05-11 – 2013-05-13 (×5): 600 mg via ORAL
  Filled 2013-05-11 (×5): qty 1

## 2013-05-11 MED ORDER — FLUCONAZOLE IN SODIUM CHLORIDE 200-0.9 MG/100ML-% IV SOLN
200.0000 mg | INTRAVENOUS | Status: DC
Start: 2013-05-11 — End: 2013-05-19
  Administered 2013-05-11 – 2013-05-18 (×8): 200 mg via INTRAVENOUS
  Filled 2013-05-11 (×10): qty 100

## 2013-05-11 NOTE — Progress Notes (Signed)
[facility], [employer].  Progress Note    Assessment:  1.AKI- non-oliguric   2. Pneumonia with ARDS- on ventilator   3. Septic shock off pressors   4. Severe acidosis - combined respiratory and metabolic   5. DM on Metformin   6. HTN   7. Kidney stones   8. Exposure to NSAIDs prior to admission   9. Hypernatremia   10. Fluid overload, past 24 hrs in neg balance      Plan:    -continue to follow renal fucntion  - replace electrolytes  - Febrile w/u .  - Agree with changing his feeding  - IV D5W to get Na lower.- will reduce the rate.        Herbie Drape, MD  Office - [phone]  ++++++++++++++++++++++++++++++++++++++++++++++++++++++++++++++  Subjective:  On vent .    Medications:  Scheduled Meds:  Current Facility-Administered Medications   Medication Dose Route Frequency   . albuterol-ipratropium  3 mL Nebulization Q4H SCH   . amLODIPine  5 mg per OG tube Daily   . cefTRIAXone  2 g Intravenous Q24H SCH   . chlorhexidine  15 mL Mouth/Throat BID   . fluconazole  200 mg Intravenous Q24H SCH   . lactobacillus/streoptococcus  1 capsule Oral Daily   . linezolid  600 mg Oral Q12H SCH   . nystatin   Topical BID   . nystatin  1 application Topical BID   . pantoprazole  40 mg Intravenous Daily   . [COMPLETED] potassium chloride  20 mEq Oral Once   . sodium chloride (PF)  10 mL Intracatheter Daily   . sucralfate  1 g Oral Q6H   . [DISCONTINUED] fluconazole  400 mg Intravenous Q24H SCH   . [DISCONTINUED] linezolid  600 mg Intravenous Q12H     Continuous Infusions:       . dextrose 150 mL/hr at 05/11/13 1546   . insulin (regular) infusion 12 Units/hr (05/11/13 1459)   . propofol Stopped (05/10/13 1200)   . [DISCONTINUED] pantoprozole (PROTONIX) infusion 8 mg/hr (05/11/13 0449)     PRN Meds:acetaminophen, albuterol, dextrose, dextrose, dextrose, glucagon (rDNA), magnesium sulfate, midazolam, morphine, ondansetron, potassium chloride, sodium phosphate IVPB, sodium phosphate IVPB, sodium phosphate IVPB,  zinc Oxide    Objective:  Vital signs in last 24 hours:  Temp:  [99 F (37.2 C)] 99 F (37.2 C)  Heart Rate:  [90-111] 101   Resp Rate:  [17-27] 27   BP: (111-166)/(68-86) 130/76 mmHg  FiO2:  [40 %-100 %] 40 %  Intake/Output last 24 hours:    Intake/Output Summary (Last 24 hours) at 05/11/13 1618  Last data filed at 05/11/13 1400   Gross per 24 hour   Intake 8342.4 ml   Output   2900 ml   Net 5442.4 ml     Intake/Output this shift:  I/O this shift:  In: 1342 [I.V.:671; NG/GT:671]  Out: 1600 [Urine:1600]    Physical Exam:   [id number] and sedated   CV: S1 S2 N RRR   Chest: coarse BS bilaterally   Ab: distended,BS present   Ext: anasarca+    Labs:    Lab 05/11/13 1058 05/11/13 0425 05/10/13 1439 05/10/13 0319 05/10/13 0311 05/09/13 0333 05/08/13 0340 05/05/13 0426   GLU -- 113* 282* -- 232* -- -- --   BUN -- 47.6* 51.5* -- 62.3* -- -- --   CREAT -- 1.0 1.2 -- 1.3 -- -- --   CA -- 8.0* 8.3* -- 8.7 -- -- --  NA -- 142 148* -- 154* -- -- --   K 3.5 3.2* 3.5 -- -- -- -- --   CL -- 110* 115* -- 121* -- -- --   CO2 -- 21* 22 -- 21* -- -- --   ALB -- -- -- -- -- -- -- 1.9*   PHOS -- -- -- 3.7 -- 3.2 3.3 --   MG -- -- -- 1.9 -- 1.8 2.0 --       Lab 05/11/13 0425 05/10/13 0311 05/09/13 0333   WBC 18.05* 17.76* 16.90*   HGB 10.7* 11.5* 10.6*   HCT 33.4* 36.8* 33.9*   MCV 88.1 90.9 87.6   MCH 28.2 28.4 27.4*   MCHC 32.0 31.3* 31.3*   RDW 16* 16* 15   MPV 11.6 11.7 11.4   PLT 159 211 223

## 2013-05-11 NOTE — Plan of Care (Signed)
Problem: Potential for Compromised Skin Integrity  Goal: Skin integrity is maintained or improved  Assess and monitor skin integrity. Identify patients at risk for skin breakdown on admission and per policy. Collaborate with interdisciplinary team and initiate plans and interventions as needed.   Outcome: Not Progressing  Continue with aggressive turning and skin care.     Problem: Impaired Mobility  Goal: Mobility/activity is maintained at optimum level for patient  Outcome: Not Progressing  Pt has extreme weakness in all extremities, only able to wiggle toes and move head. Continue to encourage movement and progressive mobility protocol. ROM education done with pt's wife.

## 2013-05-11 NOTE — Progress Notes (Signed)
[facility]- Critical Care Note     ICU Daily Progress Note        Date Time: 05/11/2013 10:41 PM  Patient Name: Adam Simmons  Attending Physician: Lawernce Ion, MD  Room: IC05/IC05-A             Assessment:     Patient Active Problem List   Diagnosis   . Respiratory failure   . Pneumonia   . Hypokalemia   . ARDS (adult respiratory distress syndrome)   . DM (diabetes mellitus)   . AKI (acute kidney injury)   . Pneumococcal sepsis     Pt failed weaning trial, rapid shallow resp, severe generalized weakness, poor cough    Plan:   Trach / peg consult for surgery.  Cont abx as done, vent support.        Subjective:   Opens eyes, on vent, follows simple commands opening mouth    Medications:       Scheduled Meds: PRN Meds:         albuterol-ipratropium 3 mL Nebulization Q4H SCH   amLODIPine 5 mg per OG tube Daily   cefTRIAXone 2 g Intravenous Q24H SCH   chlorhexidine 15 mL Mouth/Throat BID   fluconazole 200 mg Intravenous Q24H SCH   lactobacillus/streoptococcus 1 capsule Oral Daily   linezolid 600 mg Oral Q12H SCH   methylprednisolone 40 mg Intravenous Once   nystatin  Topical BID   nystatin 1 application Topical BID   pantoprazole 40 mg Intravenous Daily   [COMPLETED] potassium chloride 20 mEq Oral Once   sodium chloride (PF) 10 mL Intracatheter Daily   sucralfate 1 g Oral Q6H   [DISCONTINUED] fluconazole 400 mg Intravenous Q24H [facility]   [DISCONTINUED] linezolid 600 mg Intravenous Q12H       Continuous Infusions:       . sodium chloride     . insulin (regular) infusion 4 Units/hr (05/11/13 2156)   . propofol 30 mcg/kg/min (05/11/13 2156)   . [DISCONTINUED] dextrose 50 mL/hr at 05/11/13 1630   . [DISCONTINUED] pantoprozole (PROTONIX) infusion 8 mg/hr (05/11/13 0449)         acetaminophen 500 mg Q4H PRN   albuterol 2.5 mg Q1H PRN   dextrose 15 g PRN   dextrose 25 mL PRN   dextrose 50 mL PRN   glucagon (rDNA) 1 mg PRN   magnesium sulfate 1 g PRN   midazolam 2 mg Q1H PRN   morphine 2 mg Q2H PRN   ondansetron 4 mg  Q8H PRN   potassium chloride 20 mEq PRN   sodium phosphate IVPB 15 mmol PRN   sodium phosphate IVPB 25 mmol PRN   sodium phosphate IVPB 35 mmol PRN   zinc Oxide  PRN             Physical Exam:     Filed Vitals:    05/11/13 2000 05/11/13 2006 05/11/13 2100 05/11/13 2200   BP: 119/72  132/79 145/82   Pulse: 99 100 117 117   Temp: 99.9 F (37.7 C)      TempSrc:       Resp: 21 22 30 30    Height:       Weight:       SpO2: 99% 100% 99% 97%     Temp (24hrs), Avg:99.5 F (37.5 C), Min:99 F (37.2 C), Max:99.9 F (37.7 C)           02/19 0701 - 02/20 0700  In: 7500.4 [I.V.:5409.4]  Out: 2400 [  Urine:2200]       General Appearance: obese, on vent  Mental status: responsive  Neuro:  1-2/5 UE and LE strength  H & N: no jvd  Lungs: rapid shallow breathing  Cardiac: reg  Abdomen:  soft  Extremities: no cce  Skin: warm      Data:       Vent Settings:    Vent Settings  Vent Mode: PRVC  FiO2: 40 %  Resp Rate (Set): 20   Vt (Set, mL): 500 mL  PIP Observed (cm H2O): 17 cm H2O  PEEP/EPAP: 5 cm H20  Pressure Support / IPAP: 10 cmH20  Mean Airway Pressure: 9 cmH20      Labs:   See flow sheet      Rads:     Radiology Results (24 Hour)     ** No Results found for the last 24 hours. **      Signed by: Durward Fortes, MD  Date/Time: 05/11/2013 10:41 PM

## 2013-05-11 NOTE — Progress Notes (Signed)
More awake and alert, continues to be extremely weak. Tentative plan for tracheostomy and PEG tube on Monday. Plan again discussed in detail with patient's wife.

## 2013-05-11 NOTE — Progress Notes (Signed)
Nutritional Support Services  Nutrition Follow Up    Adam Simmons 48 y.o. male   MRN: [id number]    Nutrition Summary/Diet history:  Pt is vented and sedated.  Note that K+, mag, phosphorus and creatinine WNL.  Considering PEG/trach on Monday.  Off Propofol.    Nutrition Diagnosis:   Inadequate oral intake related to respiratory failure as evidenced by vent support.    Intervention:  1. Spoke with MD and order written to change TF to Promote to goal 70 mL/hr = 1680 kcals, 105 g pro and 1409 mL free water  2. Add ProSource NoCarb 1 packet bid = 120 kcals and 30 g pro/d. (TF+ ProSource = 1800 kcals and 135 g pro/d)     Goal: Meet estimated needs with enteral nutrition support while vented.    Monitoring:   Evaluation:   1. Residuals < 500 mL < 180 mL  2. Weights   - 17 kg since admission  3. Net I&O   + 4.1 L since admission    Nutrition risk level: High    Assessment Data:  Adm dx:  Respiratory failure   Patient Active Problem List   Diagnosis   . Respiratory failure   . Pneumonia   . Hypokalemia   . ARDS (adult respiratory distress syndrome)   . DM (diabetes mellitus)   . AKI (acute kidney injury)   . Pneumococcal sepsis   PMH:  has a past medical history of Hypertension; Diabetes mellitus; and Kidney stones.  PSH:  has past surgical history that includes Kidney stone surgery.  Pertinent labs:   Lab 05/11/13 1058 05/11/13 0425 05/10/13 1439 05/10/13 0319 05/10/13 0311 05/09/13 0333 05/08/13 0340 05/06/13 0453 05/05/13 0426   NA -- 142 148* -- 154* 149* 144 -- --   K 3.5 3.2* 3.5 -- 3.5 3.2* -- -- --   CL -- 110* 115* -- 121* 117* 112* -- --   CO2 -- 21* 22 -- 21* 22 20* -- --   BUN -- 47.6* 51.5* -- 62.3* 60.9* 69.3* -- --   CREAT -- 1.0 1.2 -- 1.3 1.3 1.8* -- --   GLU -- 113* 282* -- 232* 282* 311* -- --   CA -- 8.0* 8.3* -- 8.7 8.7 8.6 -- --   MG -- -- -- 1.9 -- 1.8 2.0 2.1 2.2   PHOS -- -- -- 3.7 -- 3.2 3.3 5.8* 5.7*   EGFR -- >60.0 >60.0 -- 58.7 58.7 40.3 -- --   Blood glucose range (48 hrs) 113-282 mg/dL  Lab  09/19/14 0109   TRIG 225*   Pertinent meds: chloprhexidine, lactobacillus/streoptococcus, methylprednisolone, pantoprazole, sucralfateSocial history:  married  Food Intolerance/Religious/Ethnic preferences: none noted  Diet Order:  Orders Placed This Encounter   Procedures   . Diet NPO effective now   . Prosource no carb Frequency:: All meals; Quantity:: A. One One packet q 6 hrs.   . Prosource no carb Frequency:: BID with meals; Quantity:: A. One   . Tube feeding-CONTINUOUS   Enteral nutrition:   Indication: vented  Route: OG  Frequency: continuous  Formula: Nepro   Rate: 25 mL/hr   Formula provides: 1080 kcals, 48 g pro and  435 mL free water. (TF+ ProSource + Propofol = 1748 kcals/d and 108 g pro/d).  Additives: ProSource NoCarb 1 packet q 6 hrs = 240 kcals and 60 g pro   Water flushes: 300 mL q 4 hrs  Residuals: none   GI symptoms: abd distended, diarrhea  Hydration: non  pitting edema, D5 @ 150 mL/hr  Skin: Stage II sacrum, abrasions  Anthropometrics  Height: 172.7 cm (5\' 8" )  Weight: 117.7 kg (259 lb 7.7 oz)  Weight Change: -0.17   IBW/kg (Calculated) Male: 70.02 kg  IBW/kg (Calculated) Male: 63.62 kg  BMI (calculated): 42.6   Wt Readings from Last 30 Encounters:   05/07/13 117.7 kg (259 lb 7.7 oz)   05/07/13 117.7 kg (259 lb 7.7 oz)   05/07/13 117.7 kg (259 lb 7.7 oz)   Learning Needs: no  Estimated Needs:  Estimated Energy Needs  Total Energy Estimated Needs: 1680-1820 kcals/kg  Method for Estimating Needs: 24-26 kcals/kg IBW  Estimated Protein Needs  Total Protein Estimated Needs: 140 g pro/kg  Method for Estimating Needs: 2 g protein/kg  Fluid Needs  Method for Estimating Needs: per MD  No Known Allergies    Geryl Councilman, RD, CNSC, CSO  Spectralink - [phone]

## 2013-05-11 NOTE — Progress Notes (Signed)
Infectious Disease            Progress Note    05/11/2013   Lendon George ZHY:[id number] is a 48 y.o. male, history of hypertension, diabetes mellitus, kidney stones. Admitted with septic shock, pneumonia, respiratory failure.    Subjective:     [name] today Symptoms: improving fever spikes,  intubated,  having diarrhea, rectal tube in place. Failed weaning trial, possible trach and PEG on Monday.    Objective:     Blood pressure 147/82, pulse 90, temperature 99 F (37.2 C), resp. rate 22, height 1.727 m (5\' 8" ), weight 117.7 kg (259 lb 7.7 oz), SpO2 99.00%.    General Appearance: Intubated , open eyes.    HEENT: Pallor negative, Anicteric sclera.  Intubated  Lungs:  Bilateral decreased breath sounds  Heart: Normal rate.  S1 and S2.    Chest Wall: Symmetric chest wall expansion.   Abdomen: Abdomen is soft, scaphoid and non-distended. There are no signs of ascites. Bowel sounds are normal. Rectal tube in place  Neurological: Patient is intubated, open eyes, somewhat responsive    Laboratory And Diagnostic Studies:     Recent Labs   [facility] 05/11/13 0425 05/10/13 0311    WBC 18.05* 17.76*    HGB 10.7* 11.5*    HCT 33.4* 36.8*    PLT 159 211    NEUTRO -- --     Recent Labs   Basename 05/11/13 0425 05/10/13 1439    NA 142 148*    K 3.2* 3.5    CL 110* 115*    CO2 21* 22    BUN 47.6* 51.5*    CREAT 1.0 1.2    GLU 113* 282*    CA 8.0* 8.3*       Blood cultures:  Coagulase-negative staph  Sputum cultures: Candida species      Current Med's:     Current Facility-Administered Medications   Medication Dose Route Frequency   . albuterol-ipratropium  3 mL Nebulization Q4H SCH   . amLODIPine  5 mg per OG tube Daily   . cefTRIAXone  2 g Intravenous Q24H SCH   . chlorhexidine  15 mL Mouth/Throat BID   . fluconazole  200 mg Intravenous Q24H SCH   . [COMPLETED] insulin regular  0-7 Units Intravenous Once   . lactobacillus/streoptococcus  1 capsule Oral Daily   . linezolid  600 mg Intravenous Q12H   .  nystatin   Topical BID   . nystatin  1 application Topical BID   . sodium chloride (PF)  10 mL Intracatheter Daily   . sucralfate  1 g Oral Q6H   . [DISCONTINUED] docusate  100 mg Oral BID   . [DISCONTINUED] fluconazole  400 mg Intravenous Q24H SCH   . [DISCONTINUED] pantoprazole  40 mg Intravenous Daily   . [DISCONTINUED] polyethylene glycol  17 g Oral BID   . [DISCONTINUED] sucralfate  1 g Oral TID AC & HS   . [DISCONTINUED] thiamine  100 mg Oral Daily             Assessment:      Condition.  Guarded   Septic shock; resolved   Strep pneumoniae sepsis   Coagulase-negative staph bacteremia     Respiratory failure, status post intubation   Bilateral pneumonia   Possible aspiration   Diabetes mellitus   Acute kidney injury; off dialysis   Diarrhea; C. Difficile negative     Plan:      Continue Rocephin   Continue  Zyvox ; switched to p.o.   Continue  Diflucan; Decrease dose to 200 mg   Continue ventilatory support   Continue supportive care   Correction of electrolytes   Nephrology follow up   Possible trach and PEG on Monday           Current A Claudius Mich, M.D.,FACP  05/11/2013  8:03 AM

## 2013-05-11 NOTE — Progress Notes (Signed)
Case Manager met with patient's wife during the morning rounds with the MD and she told the CM that she prefers patient to go to the [facility] ([facility] in [place].  Case Manager called the facility and talked to Pierz, the nurse liaison, at [phone].  CM faxed the clinical information thru [employer] to [place] at the fax number [phone] as she requested to check insurance benefits.  This information was given to pt's wife as well and CM will f/u as needed.

## 2013-05-11 NOTE — Progress Notes (Signed)
[facility]- Critical Care Note     ICU Daily Progress Note        Date Time: 05/11/2013 10:47 PM  Patient Name: Adam Simmons  Attending Physician: Lawernce Ion, MD  Room: IC05/IC05-A   Admit Date: 04/24/2013  LOS: 17 days            Assessment:     Patient Active Problem List   Diagnosis   . Respiratory failure   . Pneumonia   . Hypokalemia   . ARDS (adult respiratory distress syndrome)   . DM (diabetes mellitus)   . AKI (acute kidney injury)   . Pneumococcal sepsis   Hyponatremia resolved.  resp failure, failing to wean.  Neuropathy of critical illness.  Hypokalemia / hypomagnesemia repleted    Plan:   Cont abx, vent, nutrition, decrease free water, trach / peg planned for Mon  PT/OT          Subjective:   Responsive, on vent    Medications:       Scheduled Meds: PRN Meds:         albuterol-ipratropium 3 mL Nebulization Q4H SCH   amLODIPine 5 mg per OG tube Daily   cefTRIAXone 2 g Intravenous Q24H SCH   chlorhexidine 15 mL Mouth/Throat BID   fluconazole 200 mg Intravenous Q24H SCH   lactobacillus/streoptococcus 1 capsule Oral Daily   linezolid 600 mg Oral Q12H SCH   [COMPLETED] methylprednisolone 40 mg Intravenous Once   nystatin  Topical BID   nystatin 1 application Topical BID   pantoprazole 40 mg Intravenous Daily   [COMPLETED] potassium chloride 20 mEq Oral Once   sodium chloride (PF) 10 mL Intracatheter Daily   sucralfate 1 g Oral Q6H   [DISCONTINUED] fluconazole 400 mg Intravenous Q24H [facility]   [DISCONTINUED] linezolid 600 mg Intravenous Q12H       Continuous Infusions:       . sodium chloride 65 mL/hr at 05/11/13 2247   . insulin (regular) infusion 4 Units/hr (05/11/13 2156)   . propofol 30 mcg/kg/min (05/11/13 2243)   . [DISCONTINUED] dextrose 50 mL/hr at 05/11/13 1630   . [DISCONTINUED] pantoprozole (PROTONIX) infusion 8 mg/hr (05/11/13 0449)         acetaminophen 500 mg Q4H PRN   albuterol 2.5 mg Q1H PRN   dextrose 15 g PRN   dextrose 25 mL PRN   dextrose 50 mL PRN   glucagon (rDNA) 1 mg PRN    magnesium sulfate 1 g PRN   midazolam 2 mg Q1H PRN   morphine 2 mg Q2H PRN   ondansetron 4 mg Q8H PRN   potassium chloride 20 mEq PRN   sodium phosphate IVPB 15 mmol PRN   sodium phosphate IVPB 25 mmol PRN   sodium phosphate IVPB 35 mmol PRN   zinc Oxide  PRN             Physical Exam:     Filed Vitals:    05/11/13 2000 05/11/13 2006 05/11/13 2100 05/11/13 2200   BP: 119/72  132/79 145/82   Pulse: 99 100 117 117   Temp: 99.9 F (37.7 C)      TempSrc:       Resp: 21 22 30 30    Height:       Weight:       SpO2: 99% 100% 99% 97%     Temp (24hrs), Avg:99.5 F (37.5 C), Min:99 F (37.2 C), Max:99.9 F (37.7 C)  02/19 0701 - 02/20 0700  In: 7500.4 [I.V.:5409.4]  Out: 2400 [Urine:2200]       General Appearance: on vent  Mental status: responsive, opens eyes, eye contact,   Neuro:  Generalized severe weakness  H & N: no jvd  Lungs: rapid shallow breathingt, bilat rhonchi  Cardiac: reg  Abdomen:  soft  Extremities: no cce  Skin: no rash      Data:       Vent Settings:    Vent Settings  Vent Mode: PRVC  FiO2: 40 %  Resp Rate (Set): 20   Vt (Set, mL): 500 mL  PIP Observed (cm H2O): 17 cm H2O  PEEP/EPAP: 5 cm H20  Pressure Support / IPAP: 10 cmH20  Mean Airway Pressure: 9 cmH20      Labs:     Recent CBC   Lab 05/11/13 0425 05/10/13 0311 05/09/13 0333   WBC 18.05* 17.76* 16.90*   RBC 3.79* 4.05* 3.87*   HGB 10.7* 11.5* 10.6*   HCT 33.4* 36.8* 33.9*   MCV 88.1 90.9 87.6   PLT 159 211 223         Lab 05/11/13 2148 05/11/13 1058 05/11/13 0425 05/10/13 1439 05/10/13 0319 05/09/13 0333 05/05/13 0426   NA 138 -- 142 148* -- -- --   K 3.6 3.5 3.2* -- -- -- --   CL 108* -- 110* 115* -- -- --   CO2 21* -- 21* 22 -- -- --   GLU 118* -- 113* 282* -- -- --   BUN 42.7* -- 47.6* 51.5* -- -- --   CREAT 1.0 -- 1.0 1.2 -- -- --   MG 1.6 -- -- -- 1.9 1.8 --   PHOS 3.3 -- -- -- 3.7 3.2 --   AST -- -- -- -- -- -- 29   ALT -- -- -- -- -- -- 33   ALKPHOS -- -- -- -- -- -- 104   BILITOTAL -- -- -- -- -- -- 0.6   BILIDIRECT -- -- -- --  -- -- --   LIP -- -- -- -- -- -- --   BNP -- -- -- -- -- -- --   PT -- -- -- -- -- -- --   INR -- -- -- -- -- -- --   PTT -- -- -- -- -- -- --   DDIMER -- -- -- -- -- -- --   CK -- -- -- -- -- -- --   CKMB -- -- -- -- -- -- --   TROPI -- -- -- -- -- -- --   MYOGLOBIN -- -- -- -- -- -- --           Rads:     Radiology Results (24 Hour)     ** No Results found for the last 24 hours. **          Radiological Imaging personally reviewed.    I have personally reviewed the patient's history and 24 hour interval events, along with vitals, labs, radiology images and  ventilator settings and additional findings found in detail within ICU team notes, with their care plans developed with and reviewed by me.         Signed by: Durward Fortes, MD  Date/Time: 05/11/2013 10:47 PM

## 2013-05-12 ENCOUNTER — Inpatient Hospital Stay: Payer: BC Managed Care – PPO

## 2013-05-12 LAB — CBC
Hematocrit: 32.2 % — ABNORMAL LOW (ref 42.0–52.0)
Hgb: 10.5 g/dL — ABNORMAL LOW (ref 13.0–17.0)
MCH: 28 pg (ref 28.0–32.0)
MCHC: 32.6 g/dL (ref 32.0–36.0)
MCV: 85.9 fL (ref 80.0–100.0)
MPV: 11.7 fL (ref 9.4–12.3)
Platelets: 163 10*3/uL (ref 140–400)
RBC: 3.75 10*6/uL — ABNORMAL LOW (ref 4.70–6.00)
RDW: 16 % — ABNORMAL HIGH (ref 12–15)
WBC: 20.17 10*3/uL — ABNORMAL HIGH (ref 3.50–10.80)

## 2013-05-12 LAB — BASIC METABOLIC PANEL
Anion Gap: 9 (ref 5.0–15.0)
BUN: 45.7 mg/dL — ABNORMAL HIGH (ref 9.0–21.0)
CO2: 21 mEq/L — ABNORMAL LOW (ref 22–29)
Calcium: 8.1 mg/dL — ABNORMAL LOW (ref 8.5–10.5)
Chloride: 109 mEq/L — ABNORMAL HIGH (ref 98–107)
Creatinine: 1.1 mg/dL (ref 0.7–1.3)
Glucose: 211 mg/dL — ABNORMAL HIGH (ref 70–100)
Potassium: 4.3 mEq/L (ref 3.5–5.1)
Sodium: 139 mEq/L (ref 136–145)

## 2013-05-12 LAB — GLUCOSE WHOLE BLOOD - POCT
Whole Blood Glucose POCT: 113 mg/dL — ABNORMAL HIGH (ref 70–100)
Whole Blood Glucose POCT: 117 mg/dL — ABNORMAL HIGH (ref 70–100)
Whole Blood Glucose POCT: 121 mg/dL — ABNORMAL HIGH (ref 70–100)
Whole Blood Glucose POCT: 133 mg/dL — ABNORMAL HIGH (ref 70–100)
Whole Blood Glucose POCT: 139 mg/dL — ABNORMAL HIGH (ref 70–100)
Whole Blood Glucose POCT: 142 mg/dL — ABNORMAL HIGH (ref 70–100)
Whole Blood Glucose POCT: 169 mg/dL — ABNORMAL HIGH (ref 70–100)
Whole Blood Glucose POCT: 176 mg/dL — ABNORMAL HIGH (ref 70–100)
Whole Blood Glucose POCT: 182 mg/dL — ABNORMAL HIGH (ref 70–100)
Whole Blood Glucose POCT: 188 mg/dL — ABNORMAL HIGH (ref 70–100)
Whole Blood Glucose POCT: 188 mg/dL — ABNORMAL HIGH (ref 70–100)
Whole Blood Glucose POCT: 190 mg/dL — ABNORMAL HIGH (ref 70–100)
Whole Blood Glucose POCT: 191 mg/dL — ABNORMAL HIGH (ref 70–100)
Whole Blood Glucose POCT: 195 mg/dL — ABNORMAL HIGH (ref 70–100)
Whole Blood Glucose POCT: 197 mg/dL — ABNORMAL HIGH (ref 70–100)
Whole Blood Glucose POCT: 199 mg/dL — ABNORMAL HIGH (ref 70–100)
Whole Blood Glucose POCT: 202 mg/dL — ABNORMAL HIGH (ref 70–100)
Whole Blood Glucose POCT: 203 mg/dL — ABNORMAL HIGH (ref 70–100)
Whole Blood Glucose POCT: 203 mg/dL — ABNORMAL HIGH (ref 70–100)
Whole Blood Glucose POCT: 214 mg/dL — ABNORMAL HIGH (ref 70–100)
Whole Blood Glucose POCT: 246 mg/dL — ABNORMAL HIGH (ref 70–100)
Whole Blood Glucose POCT: 257 mg/dL — ABNORMAL HIGH (ref 70–100)
Whole Blood Glucose POCT: 98 mg/dL (ref 70–100)

## 2013-05-12 LAB — APTT: PTT: 25 s (ref 23–37)

## 2013-05-12 LAB — PT/INR
PT INR: 1.1
PT: 14.2 s (ref 12.6–15.0)

## 2013-05-12 LAB — PHOSPHORUS: Phosphorus: 4.2 mg/dL (ref 2.3–4.7)

## 2013-05-12 LAB — MAGNESIUM: Magnesium: 1.9 mg/dL (ref 1.6–2.6)

## 2013-05-12 LAB — GFR: EGFR: >60

## 2013-05-12 MED ORDER — VANCOMYCIN ORAL SOLUTION 250 MG/10 ML UNIT DOSE
125.0000 mg | Freq: Four times a day (QID) | ORAL | Status: DC
Start: 2013-05-12 — End: 2013-05-15
  Administered 2013-05-12 – 2013-05-14 (×8): 125 mg via ORAL
  Filled 2013-05-12 (×16): qty 10

## 2013-05-12 MED ORDER — HEPARIN SODIUM (PORCINE) 5000 UNIT/ML IJ SOLN
40.0000 [IU]/kg | INTRAMUSCULAR | Status: DC | PRN
Start: 2013-05-12 — End: 2013-05-14
  Administered 2013-05-13 (×2): 3500 [IU] via INTRAVENOUS
  Filled 2013-05-12 (×2): qty 1

## 2013-05-12 MED ORDER — HEPARIN SODIUM (PORCINE) 5000 UNIT/ML IJ SOLN
80.0000 [IU]/kg | INTRAMUSCULAR | Status: DC | PRN
Start: 2013-05-12 — End: 2013-05-14
  Administered 2013-05-13: 6950 [IU] via INTRAVENOUS
  Filled 2013-05-12: qty 2

## 2013-05-12 MED ORDER — HEPARIN (PORCINE) IN D5W 50-5 UNIT/ML-% IV SOLN
18.0000 [IU]/kg/h | INTRAVENOUS | Status: DC
Start: 2013-05-12 — End: 2013-05-14
  Administered 2013-05-12: 18 [IU]/kg/h via INTRAVENOUS
  Administered 2013-05-13: 26 [IU]/kg/h via INTRAVENOUS
  Administered 2013-05-13: 24 [IU]/kg/h via INTRAVENOUS
  Filled 2013-05-12 (×3): qty 500

## 2013-05-12 NOTE — Progress Notes (Signed)
Patient responds when voice, opens eyes, flaccid slight movement of arms and legs.  Right upper extremity swollen, with good radial pulse, doppler ordered by Dr Nedra Hai.  Remains on sedation propofol, with versed prn, for increased respirations.  Scheduled for Trach, and peg on Monday 2/23.

## 2013-05-12 NOTE — Progress Notes (Addendum)
[facility]- Critical Care Note     ICU Daily Progress Note        Date Time: 05/12/2013 11:11 AM  Patient Name: Adam Simmons  Attending Physician: Lawernce Ion, MD  Room: IC05/IC05-A   Admit Date: 04/24/2013  LOS: 18 days        Assessment/Plan:   # Neurologic: sedation vacation. Critical care myopathy. Diffuse generalized weakness.     # Respiratory: multilobar Strep Pneumonia pneumonia, resolved ARDS.  Still tachypnic on ventilator. Cont chest physiotherapy and mucomyst. Failed multiple episodes of SBT, also global weakness. Plan for trach/peg Monday by Dr. Germaine Pomfret.  Pt's wife agrees to above plan.    # Nutrition: appreciate nutritionist recommendation, cont tube feeds    # Endocrine: Diabetes; uncontrolled, cont insulin gtt    # Cardiac: septic shock resolved. Off pressors. Echo indicated EF 50%    # Hematologic: Thrombocytopenia resolved.     #renal: acute kidney injury stable, appreciate Dr. Dalbert Batman group recs. Cont free water bolus q4hrs. Cont d5W.     # Infectious diseases: streptococcus pneumonia bacteremia/ pneumonia.  Appreciate Dr. Myrtis Ser consultation and evaluation. Continue antibiotics    Code Status: full code     I have personally reviewed the patient's history and 24 hour interval events, along with vitals, labs, radiology images, and nurses report.      discussed with patient's wife at the bedside and answered her questions.     addendum: patient had right upper extremity doppler prelim read indicating clot around picc line site.  Will order heparin gtt.     Subjective:     49 y.o. male h/o DM type 2, HTN who presents to the hospital with respiratory failure, from streptococcus pneumonia, ARDS, AKI and septic shock.       No overnight event    Hospital Course  2/3   ARDS / PNA / Sepsis  w/ resp failure- intubated,  Flu test-neg  Resp c+s= gm + cocci , x2BC= gm + cocci resembling strep  2/6 [place] placed for worsening creatinine, 14 L positive  2/8 dialysis  2/10 failed  SBT  2/11 tachypnic, readjusted ventilator tidal volume.   2/13 hemodialysis catheter removed, bronchoscopy with improved pulmonary toilet  2/15 pulmonary toilet, mucomyst, chest PT vest  2/17 fever 102F, cooling blanket    Medications:   Scheduled Meds:  Current Facility-Administered Medications   Medication Dose Route Frequency   . albuterol-ipratropium  3 mL Nebulization Q4H SCH   . amLODIPine  5 mg per OG tube Daily   . cefTRIAXone  2 g Intravenous Q24H SCH   . chlorhexidine  15 mL Mouth/Throat BID   . fluconazole  200 mg Intravenous Q24H SCH   . lactobacillus/streoptococcus  1 capsule Oral Daily   . linezolid  600 mg Oral Q12H SCH   . [COMPLETED] magnesium sulfate  1 g Intravenous Once   . [COMPLETED] methylprednisolone  40 mg Intravenous Once   . nystatin   Topical BID   . nystatin  1 application Topical BID   . pantoprazole  40 mg Intravenous Daily   . [COMPLETED] potassium chloride  20 mEq Oral Once   . sodium chloride (PF)  10 mL Intracatheter Daily   . sucralfate  1 g Oral Q6H   . vancomycin  125 mg Oral Q6H SCH   . [DISCONTINUED] linezolid  600 mg Intravenous Q12H         Continuous Infusions:       . sodium chloride  65 mL/hr at 05/11/13 2247   . insulin (regular) infusion 11 Units/hr (05/12/13 1010)   . propofol 40 mcg/kg/min (05/12/13 0936)   . [DISCONTINUED] dextrose 50 mL/hr at 05/11/13 1630   . [DISCONTINUED] pantoprozole (PROTONIX) infusion 8 mg/hr (05/11/13 0449)          Physical Exam:     Filed Vitals:    05/12/13 0900   BP: 131/73   Pulse: 111   Temp:    Resp: 24   SpO2: 98%         Intake/Output Summary (Last 24 hours) at 05/12/13 1111  Last data filed at 05/12/13 0600   Gross per 24 hour   Intake 4382.59 ml   Output   3675 ml   Net 707.59 ml     General Appearance: obese, able to track, intubated   Mental status: sedated  Neuro: awake, diffusely weak, intubated, not able to move any of his extremities.   Neck: supple   Lungs: bilateral rhonchi anteriorly, no wheeze  Cardiac: ns1, ns2, no  m/r/g, tachycardic   Abdomen: Obese, soft, nontender, hypoactive bowel sounds   Extremities: no lower extremity edema   Skin:  has a triangular mark on right forearm area that is scabbed over, has abrasion on left forearm, has grease on his hands, has abrasions on his sacral area, has various bruising on his upper extremities.      Data:       Invasive ICU Hemodynamics:    Invasive Hemodynamic Monitoring  CVP (mmHg): 22 mmHg  CO (L/min): 10.4 L/min  CI (L/min/m2): 4.3 L/min/m2    Art Line  Arterial Line BP: 139/70 mmHg  Arterial Line MAP (mmHg): 89 mmHg  Arterial Line Location: Left radial  Art Line Wave Form: Appropriate wave forms      Vent Settings:    Vent Settings  Vent Mode: PRVC  FiO2: 40 %  Resp Rate (Set): 20   Vt (Set, mL): 500 mL  PIP Observed (cm H2O): 13 cm H2O  PEEP/EPAP: 5 cm H20  Pressure Support / IPAP: 10 cmH20  Mean Airway Pressure: 9 cmH20        Labs:       Labs (last 72 hours):  Recent Labs   Basename 05/12/13 0409 05/11/13 0425 05/10/13 0311    WBC 20.17* 18.05* 17.76*    HGB 10.5* 10.7* 11.5*    HCT 32.2* 33.4* 36.8*    LABPLAT -- -- --     No results found for this basename: PT:3,INR:3,PTT:3 in the last 72 hours Recent Labs   Basename 05/12/13 0409 05/11/13 2148 05/11/13 1058 05/11/13 0425 05/10/13 0319    NA 139 138 -- 142 --    K 4.3 3.6 3.5 -- --    CL 109* 108* -- 110* --    CO2 21* 21* -- 21* --    BUN 45.7* 42.7* -- 47.6* --    CREAT 1.1 1.0 -- 1.0 --    GLU 211* 118* -- 113* --    CA 8.1* 8.2* -- 8.0* --    MG 1.9 1.6 -- -- 1.9    PHOS 4.2 3.3 -- -- 3.7                     Rads:   Radiological Imaging personally reviewed,and agree with radiology report including:       CXR  05/08/2013  Significantly improved infiltrates compared to 04/30/2013      I have personally reviewed the  patient's history and 24 hour interval events, along with vitals, labs, radiology images and nursing.         Signed by: Lawernce Ion, MD  Date/Time: 05/12/2013 11:11 AM

## 2013-05-12 NOTE — Progress Notes (Signed)
Infectious Disease            Progress Note    05/12/2013   Adam Simmons ZOX:[id number] is a 48 y.o. male, history of hypertension, diabetes mellitus, kidney stones. Admitted with septic shock, pneumonia, respiratory failure.    Subjective:     Adam Simmons today Symptoms: still fever, leukocytosis worsening,  diarrhea, rectal tube in place. Failed weaning trial, possible trach and PEG on Monday.    Objective:     Blood pressure 131/73, pulse 111, temperature 99.7 F (37.6 C), temperature source Core, resp. rate 24, height 1.727 m (5\' 8" ), weight 115 kg (253 lb 8.5 oz), SpO2 98.00%.    General Appearance: Intubated , open eyes.    HEENT: Pallor negative, Anicteric sclera.  Intubated  Lungs:  Bilateral decreased breath sounds  Heart: Normal rate.  S1 and S2.    Chest Wall: Symmetric chest wall expansion.   Abdomen: Abdomen is soft, scaphoid and non-distended. There are no signs of ascites. Bowel sounds are normal. Rectal tube in place  Neurological: Patient is intubated, open eyes    Laboratory And Diagnostic Studies:     Recent Labs   [facility] 05/12/13 0409 05/11/13 0425    WBC 20.17* 18.05*    HGB 10.5* 10.7*    HCT 32.2* 33.4*    PLT 163 159    NEUTRO -- --     Recent Labs   [facility] 05/12/13 0409 05/11/13 2148    NA 139 138    K 4.3 3.6    CL 109* 108*    CO2 21* 21*    BUN 45.7* 42.7*    CREAT 1.1 1.0    GLU 211* 118*    CA 8.1* 8.2*       Blood cultures:  Coagulase-negative staph  Sputum cultures: Candida species      Current Med's:     Current Facility-Administered Medications   Medication Dose Route Frequency   . albuterol-ipratropium  3 mL Nebulization Q4H SCH   . amLODIPine  5 mg per OG tube Daily   . cefTRIAXone  2 g Intravenous Q24H SCH   . chlorhexidine  15 mL Mouth/Throat BID   . fluconazole  200 mg Intravenous Q24H SCH   . lactobacillus/streoptococcus  1 capsule Oral Daily   . linezolid  600 mg Oral Q12H SCH   . [COMPLETED] magnesium sulfate  1 g Intravenous Once   . [COMPLETED]  methylprednisolone  40 mg Intravenous Once   . nystatin   Topical BID   . nystatin  1 application Topical BID   . pantoprazole  40 mg Intravenous Daily   . [COMPLETED] potassium chloride  20 mEq Oral Once   . sodium chloride (PF)  10 mL Intracatheter Daily   . sucralfate  1 g Oral Q6H   . vancomycin  125 mg Oral Q6H SCH   . [DISCONTINUED] linezolid  600 mg Intravenous Q12H             Assessment:      Condition.  Guarded   Septic shock; resolved   Strep pneumoniae sepsis   Coagulase-negative staph bacteremia     Respiratory failure, status post intubation   Bilateral pneumonia   Possible aspiration   Diabetes mellitus   Acute kidney injury; off dialysis   Diarrhea; C. Difficile negative     Plan:      Continue Rocephin   Continue Zyvox    Continue  Diflucan   Start empiric p.o. Vancomycin considering fever,  diarrhea, worsening leukocytosis   Continue ventilatory support   Continue supportive care   Correction of electrolytes   Nephrology follow up   Possible trach and PEG on Monday   Discussed with Dr. Roxan Hockey, M.D.,FACP  05/12/2013  9:22 AM

## 2013-05-12 NOTE — Progress Notes (Signed)
[facility], [employer].  Progress Note    Assessment:  1. AKI - Due to ATN in the setting of septic shock. S/p HD on 2/8. Has since recovered. Dialysis catheter was removed. Cr holding at 1.1 mg/dl with good urine output.    2. Hypernatremia - Resolved. On maintenance 1/2 NS  2. Pneumonia with ARDS- On ventilator   3. Septic shock - Recovered off pressors   5. DM   6. HTN  7. Anemia  8. Nutrition - On tube feeds    Plan:  1. Supportive care    Troy Sine, MD  Office - [phone]  ++++++++++++++++++++++++++++++++++++++++++++++++++++++++++++++  Subjective:  Intubated    Medications:  Scheduled Meds:  Current Facility-Administered Medications   Medication Dose Route Frequency   . albuterol-ipratropium  3 mL Nebulization Q4H SCH   . amLODIPine  5 mg per OG tube Daily   . cefTRIAXone  2 g Intravenous Q24H SCH   . chlorhexidine  15 mL Mouth/Throat BID   . fluconazole  200 mg Intravenous Q24H SCH   . lactobacillus/streoptococcus  1 capsule Oral Daily   . linezolid  600 mg Oral Q12H SCH   . [COMPLETED] magnesium sulfate  1 g Intravenous Once   . [COMPLETED] methylprednisolone  40 mg Intravenous Once   . nystatin   Topical BID   . nystatin  1 application Topical BID   . pantoprazole  40 mg Intravenous Daily   . sodium chloride (PF)  10 mL Intracatheter Daily   . sucralfate  1 g Oral Q6H   . vancomycin  125 mg Oral Q6H SCH     Continuous Infusions:     . sodium chloride 65 mL/hr at 05/12/13 1354   . insulin (regular) infusion 7 Units/hr (05/12/13 1510)   . propofol 40 mcg/kg/min (05/12/13 1554)   . [DISCONTINUED] dextrose 50 mL/hr at 05/11/13 1630     PRN Meds:acetaminophen, albuterol, dextrose, dextrose, dextrose, glucagon (rDNA), magnesium sulfate, midazolam, morphine, ondansetron, potassium chloride, sodium phosphate IVPB, sodium phosphate IVPB, sodium phosphate IVPB, zinc Oxide    Objective:  Vital signs in last 24 hours:  Temp:  [99.7 F (37.6 C)-100 F (37.8 C)] 99.7 F (37.6 C)  Heart  Rate:  [93-117] 111   Resp Rate:  [20-30] 24   BP: (102-145)/(59-86) 131/73 mmHg  FiO2:  [40 %] 40 %  Intake/Output last 24 hours:    Intake/Output Summary (Last 24 hours) at 05/12/13 1558  Last data filed at 05/12/13 1400   Gross per 24 hour   Intake 5094.74 ml   Output   3075 ml   Net 2019.74 ml     Intake/Output this shift:  I/O this shift:  In: 1012.15 [I.V.:862.15; IV Piggyback:150]  Out: -     Physical Exam:   Gen: WD M sedated   HEENT: Intubated   CV: S1 S2 N RRR   Chest: Clear anteriorly   Ab: ND NT soft no HSM +BS   GU: Foley   Ext: No C/E    Labs:    Lab 05/12/13 0409 05/11/13 2148 05/11/13 1058 05/11/13 0425 05/10/13 0319   GLU 211* 118* -- 113* --   BUN 45.7* 42.7* -- 47.6* --   CREAT 1.1 1.0 -- 1.0 --   CA 8.1* 8.2* -- 8.0* --   NA 139 138 -- 142 --   K 4.3 3.6 3.5 -- --   CL 109* 108* -- 110* --   CO2 21* 21* -- 21* --  ALB -- -- -- -- --   PHOS 4.2 3.3 -- -- 3.7   MG 1.9 1.6 -- -- 1.9       Lab 05/12/13 0409 05/11/13 0425 05/10/13 0311   WBC 20.17* 18.05* 17.76*   HGB 10.5* 10.7* 11.5*   HCT 32.2* 33.4* 36.8*   MCV 85.9 88.1 90.9   MCH 28.0 28.2 28.4   MCHC 32.6 32.0 31.3*   RDW 16* 16* 16*   MPV 11.7 11.6 11.7   PLT 163 159 211

## 2013-05-13 LAB — MAN DIFF ONLY
Band Neutrophils Absolute: 0.33 10*3/uL (ref 0.00–1.00)
Band Neutrophils: 2 %
Basophils Absolute Manual: 0.16 10*3/uL (ref 0.00–0.20)
Basophils Manual: 1 %
Eosinophils Absolute Manual: 1.95 10*3/uL — ABNORMAL HIGH (ref 0.00–0.70)
Eosinophils Manual: 12 %
Lymphocytes Absolute Manual: 0.81 10*3/uL (ref 0.50–4.40)
Lymphocytes Manual: 5 %
Metamyelocytes Absolute: 0.16 10*3/uL — ABNORMAL HIGH
Metamyelocytes: 1 %
Monocytes Absolute: 0.16 10*3/uL (ref 0.00–1.20)
Monocytes Manual: 1 %
Myelocytes Absolute: 0.16 10*3/uL — ABNORMAL HIGH
Myelocytes: 1 %
Neutrophils Absolute Manual: 12.53 10*3/uL — ABNORMAL HIGH (ref 1.80–8.10)
Nucleated RBC: 0 (ref 0–1)
Segmented Neutrophils: 77 %

## 2013-05-13 LAB — MAGNESIUM: Magnesium: 1.8 mg/dL (ref 1.6–2.6)

## 2013-05-13 LAB — CBC AND DIFFERENTIAL
Hematocrit: 28 % — ABNORMAL LOW (ref 42.0–52.0)
Hgb: 9.5 g/dL — ABNORMAL LOW (ref 13.0–17.0)
MCH: 29.3 pg (ref 28.0–32.0)
MCHC: 33.9 g/dL (ref 32.0–36.0)
MCV: 86.4 fL (ref 80.0–100.0)
MPV: 12.2 fL (ref 9.4–12.3)
Platelets: 172 10*3/uL (ref 140–400)
RBC: 3.24 10*6/uL — ABNORMAL LOW (ref 4.70–6.00)
RDW: 16 % — ABNORMAL HIGH (ref 12–15)
WBC: 16.27 10*3/uL — ABNORMAL HIGH (ref 3.50–10.80)

## 2013-05-13 LAB — GLUCOSE WHOLE BLOOD - POCT
Whole Blood Glucose POCT: 136 mg/dL — ABNORMAL HIGH (ref 70–100)
Whole Blood Glucose POCT: 149 mg/dL — ABNORMAL HIGH (ref 70–100)
Whole Blood Glucose POCT: 151 mg/dL — ABNORMAL HIGH (ref 70–100)
Whole Blood Glucose POCT: 156 mg/dL — ABNORMAL HIGH (ref 70–100)
Whole Blood Glucose POCT: 159 mg/dL — ABNORMAL HIGH (ref 70–100)
Whole Blood Glucose POCT: 163 mg/dL — ABNORMAL HIGH (ref 70–100)
Whole Blood Glucose POCT: 168 mg/dL — ABNORMAL HIGH (ref 70–100)
Whole Blood Glucose POCT: 171 mg/dL — ABNORMAL HIGH (ref 70–100)
Whole Blood Glucose POCT: 175 mg/dL — ABNORMAL HIGH (ref 70–100)
Whole Blood Glucose POCT: 177 mg/dL — ABNORMAL HIGH (ref 70–100)
Whole Blood Glucose POCT: 181 mg/dL — ABNORMAL HIGH (ref 70–100)
Whole Blood Glucose POCT: 182 mg/dL — ABNORMAL HIGH (ref 70–100)
Whole Blood Glucose POCT: 185 mg/dL — ABNORMAL HIGH (ref 70–100)
Whole Blood Glucose POCT: 186 mg/dL — ABNORMAL HIGH (ref 70–100)
Whole Blood Glucose POCT: 192 mg/dL — ABNORMAL HIGH (ref 70–100)
Whole Blood Glucose POCT: 193 mg/dL — ABNORMAL HIGH (ref 70–100)
Whole Blood Glucose POCT: 195 mg/dL — ABNORMAL HIGH (ref 70–100)
Whole Blood Glucose POCT: 199 mg/dL — ABNORMAL HIGH (ref 70–100)
Whole Blood Glucose POCT: 199 mg/dL — ABNORMAL HIGH (ref 70–100)
Whole Blood Glucose POCT: 200 mg/dL — ABNORMAL HIGH (ref 70–100)
Whole Blood Glucose POCT: 201 mg/dL — ABNORMAL HIGH (ref 70–100)
Whole Blood Glucose POCT: 204 mg/dL — ABNORMAL HIGH (ref 70–100)
Whole Blood Glucose POCT: 207 mg/dL — ABNORMAL HIGH (ref 70–100)
Whole Blood Glucose POCT: 215 mg/dL — ABNORMAL HIGH (ref 70–100)

## 2013-05-13 LAB — BASIC METABOLIC PANEL
Anion Gap: 12 (ref 5.0–15.0)
BUN: 38.9 mg/dL — ABNORMAL HIGH (ref 9.0–21.0)
CO2: 19 mEq/L — ABNORMAL LOW (ref 22–29)
Calcium: 7.9 mg/dL — ABNORMAL LOW (ref 8.5–10.5)
Chloride: 109 mEq/L — ABNORMAL HIGH (ref 98–107)
Creatinine: 1 mg/dL (ref 0.7–1.3)
Glucose: 188 mg/dL — ABNORMAL HIGH (ref 70–100)
Potassium: 3 mEq/L — ABNORMAL LOW (ref 3.5–5.1)
Sodium: 140 mEq/L (ref 136–145)

## 2013-05-13 LAB — APTT
PTT: 31 s (ref 23–37)
PTT: 45 s — ABNORMAL HIGH (ref 23–37)
PTT: 48 s — ABNORMAL HIGH (ref 23–37)

## 2013-05-13 LAB — PHOSPHORUS: Phosphorus: 3.4 mg/dL (ref 2.3–4.7)

## 2013-05-13 LAB — CELL MORPHOLOGY
Cell Morphology: ABNORMAL — AB
Platelet Estimate: NORMAL

## 2013-05-13 LAB — GFR: EGFR: >60

## 2013-05-13 NOTE — Progress Notes (Signed)
[facility]- Critical Care Note     ICU Daily Progress Note        Date Time: 05/13/2013 11:36 PM  Patient Name: Adam Simmons  Attending Physician: Lawernce Ion, MD  Room: IC05/IC05-A   Admit Date: 04/24/2013  LOS: 19 days            Assessment:     Patient Active Problem List   Diagnosis   . Respiratory failure   . Pneumonia   . Hypokalemia   . ARDS (adult respiratory distress syndrome)   . DM (diabetes mellitus)   . AKI (acute kidney injury)   . Pneumococcal sepsis   Prologued resp failure, deconditioning, AKI  Normalized kidney function  Hypokalemia repleted    Plan:   Trach/peg/ tomorrow  rehab, enteral feedings.        Subjective:   Responsive, on vent    Medications:       Scheduled Meds: PRN Meds:         albuterol-ipratropium 3 mL Nebulization Q4H SCH   amLODIPine 5 mg per OG tube Daily   cefTRIAXone 2 g Intravenous Q24H SCH   chlorhexidine 15 mL Mouth/Throat BID   fluconazole 200 mg Intravenous Q24H [facility]   lactobacillus/streoptococcus 1 capsule Oral Daily   linezolid 600 mg Oral Q12H SCH   nystatin  Topical BID   nystatin 1 application Topical BID   pantoprazole 40 mg Intravenous Daily   sodium chloride (PF) 10 mL Intracatheter Daily   sucralfate 1 g Oral Q6H   vancomycin 125 mg Oral Q6H SCH       Continuous Infusions:       . sodium chloride 65 mL/hr at 05/13/13 1725   . heparin 01093 units in dextrose 5% 500 mL 26 Units/kg/hr (05/13/13 1949)   . insulin (regular) infusion 9.5 Units/hr (05/13/13 2102)   . propofol 40 mcg/kg/min (05/13/13 2033)         acetaminophen 500 mg Q4H PRN   albuterol 2.5 mg Q1H PRN   dextrose 15 g PRN   dextrose 25 mL PRN   dextrose 50 mL PRN   glucagon (rDNA) 1 mg PRN   heparin (porcine) 40 Units/kg (Adjusted) PRN   heparin (porcine) 80 Units/kg (Adjusted) PRN   magnesium sulfate 1 g PRN   midazolam 2 mg Q1H PRN   morphine 2 mg Q2H PRN   ondansetron 4 mg Q8H PRN   potassium chloride 20 mEq PRN   sodium phosphate IVPB 15 mmol PRN   sodium phosphate IVPB 25 mmol PRN    sodium phosphate IVPB 35 mmol PRN   zinc Oxide  PRN             Physical Exam:     Filed Vitals:    05/13/13 2002 05/13/13 2100 05/13/13 2200 05/13/13 2330   BP:  129/67 143/81    Pulse: 108 114 116    Temp:  100.4 F (38 C)     TempSrc:  Core     Resp: 28 30 30     Height:       Weight:       SpO2: 99% 98% 99% 98%     Temp (24hrs), Avg:100.1 F (37.8 C), Min:99.5 F (37.5 C), Max:100.4 F (38 C)           02/21 0701 - 02/22 0700  In: 4889.87 [I.V.:2619.87]  Out: 3675 [Urine:3475]       General Appearance: on vent  Mental status: arousable, responsive  Neuro:  Generalized weakness, little ext movement  H & N: no jvd  Lungs: basilar rhonchi  Cardiac: reg  Abdomen:  nt  Extremities: trace edema  Skin: warm, no rash      Data:       Vent Settings:    Vent Settings  Vent Mode: PRVC  FiO2: 40 %  Resp Rate (Set): 20   Vt (Set, mL): 500 mL  PIP Observed (cm H2O): 24 cm H2O  PEEP/EPAP: 5 cm H20  Pressure Support / IPAP: 10 cmH20  Mean Airway Pressure: 13 cmH20      Labs:     Recent CBC   Lab 05/13/13 0632 05/12/13 0409 05/11/13 0425   WBC 16.27* 20.17* 18.05*   RBC 3.24* 3.75* 3.79*   HGB 9.5* 10.5* 10.7*   HCT 28.0* 32.2* 33.4*   MCV 86.4 85.9 88.1   PLT 172 163 159         Lab 05/13/13 1841 05/13/13 1129 05/13/13 0719 05/13/13 0409 05/12/13 2230 05/12/13 0409 05/11/13 2148   NA -- -- 140 -- -- 139 138   K -- -- 3.0* -- -- 4.3 3.6   CL -- -- 109* -- -- 109* 108*   CO2 -- -- 19* -- -- 21* 21*   GLU -- -- 188* -- -- 211* 118*   BUN -- -- 38.9* -- -- 45.7* 42.7*   CREAT -- -- 1.0 -- -- 1.1 1.0   MG -- -- -- 1.8 -- 1.9 1.6   PHOS -- -- -- 3.4 -- 4.2 3.3   AST -- -- -- -- -- -- --   ALT -- -- -- -- -- -- --   ALKPHOS -- -- -- -- -- -- --   BILITOTAL -- -- -- -- -- -- --   BILIDIRECT -- -- -- -- -- -- --   LIP -- -- -- -- -- -- --   BNP -- -- -- -- -- -- --   PT -- -- -- -- 14.2 -- --   INR -- -- -- -- 1.1 -- --   PTT 48* 45* -- 31 -- -- --   DDIMER -- -- -- -- -- -- --   CK -- -- -- -- -- -- --   CKMB -- -- -- -- -- -- --    TROPI -- -- -- -- -- -- --   MYOGLOBIN -- -- -- -- -- -- --           Rads:     Radiology Results (24 Hour)     ** No Results found for the last 24 hours. **          Radiological Imaging personally reviewed.    I have personally reviewed the patient's history and 24 hour interval events, along with vitals, labs, radiology images and  ventilator settings and additional findings found in detail within ICU team notes, with their care plans developed with and reviewed by me.         Signed by: Durward Fortes, MD  Date/Time: 05/13/2013 11:36 PM

## 2013-05-13 NOTE — Progress Notes (Signed)
[facility] [employer].  Progress Note    Assessment:  1. AKI - Due to ATN in the setting of septic shock. S/p HD on 2/8. Has since recovered. Dialysis catheter was removed. Cr holding at 1.0 mg/dl with good urine output.    2. Hypernatremia - Resolved. On maintenance 1/2 NS  2. Pneumonia with ARDS- On ventilator   3. Septic shock - Recovered off pressors   5. DM   6. HTN  7. Anemia  8. Nutrition - On tube feeds  9. Hypokalemia    Plan:  1. Supportive care  2. Replete K per ICU protocol    Troy Sine, MD  Office - [phone]  ++++++++++++++++++++++++++++++++++++++++++++++++++++++++++++++  Subjective:  Intubated    Medications:  Scheduled Meds:  Current Facility-Administered Medications   Medication Dose Route Frequency   . albuterol-ipratropium  3 mL Nebulization Q4H SCH   . amLODIPine  5 mg per OG tube Daily   . cefTRIAXone  2 g Intravenous Q24H SCH   . chlorhexidine  15 mL Mouth/Throat BID   . fluconazole  200 mg Intravenous Q24H SCH   . lactobacillus/streoptococcus  1 capsule Oral Daily   . linezolid  600 mg Oral Q12H SCH   . nystatin   Topical BID   . nystatin  1 application Topical BID   . pantoprazole  40 mg Intravenous Daily   . sodium chloride (PF)  10 mL Intracatheter Daily   . sucralfate  1 g Oral Q6H   . vancomycin  125 mg Oral Q6H SCH     Continuous Infusions:       . sodium chloride 65 mL/hr at 05/13/13 0535   . heparin 09811 units in dextrose 5% 500 mL 22 Units/kg/hr (05/13/13 0530)   . insulin (regular) infusion 8.5 Units/hr (05/13/13 0922)   . propofol 40 mcg/kg/min (05/13/13 0630)     PRN Meds:acetaminophen, albuterol, dextrose, dextrose, dextrose, glucagon (rDNA), heparin (porcine), heparin (porcine), magnesium sulfate, midazolam, morphine, ondansetron, potassium chloride, sodium phosphate IVPB, sodium phosphate IVPB, sodium phosphate IVPB, zinc Oxide    Objective:  Vital signs in last 24 hours:  Temp:  [99.5 F (37.5 C)-100.6 F (38.1 C)] 99.5 F (37.5 C)  Heart  Rate:  [102-111] 103   Resp Rate:  [23-37] 29   BP: (110-141)/(59-83) 136/74 mmHg  FiO2:  [40 %-100 %] 40 %  Intake/Output last 24 hours:    Intake/Output Summary (Last 24 hours) at 05/13/13 1031  Last data filed at 05/13/13 0600   Gross per 24 hour   Intake 4018.17 ml   Output   2875 ml   Net 1143.17 ml     Intake/Output this shift:       Physical Exam:   Gen: WD M sedated   HEENT: Intubated   CV: S1 S2 N tachy reg   Chest: Clear anteriorly   Ab: ND NT soft no HSM +BS   GU: Foley   Ext: No C/E    Labs:    Lab 05/13/13 0719 05/13/13 0409 05/12/13 0409 05/11/13 2148   GLU 188* -- 211* 118*   BUN 38.9* -- 45.7* 42.7*   CREAT 1.0 -- 1.1 1.0   CA 7.9* -- 8.1* 8.2*   NA 140 -- 139 138   K 3.0* -- 4.3 3.6   CL 109* -- 109* 108*   CO2 19* -- 21* 21*   ALB -- -- -- --   PHOS -- 3.4 4.2 3.3   MG --  1.8 1.9 1.6       Lab 05/13/13 0632 05/12/13 0409 05/11/13 0425   WBC 16.27* 20.17* 18.05*   HGB 9.5* 10.5* 10.7*   HCT 28.0* 32.2* 33.4*   MCV 86.4 85.9 88.1   MCH 29.3 28.0 28.2   MCHC 33.9 32.6 32.0   RDW 16* 16* 16*   MPV 12.2 11.7 11.6   PLT 172 163 159

## 2013-05-13 NOTE — Progress Notes (Signed)
Infectious Disease            Progress Note    05/13/2013   Nery Frappier ZOX:[id number],XBJ:[id number] is a 48 y.o. male, history of hypertension, diabetes mellitus, kidney stones. Admitted with septic shock, pneumonia, respiratory failure.    Subjective:     Adam Simmons today Symptoms: Low-grade fever, improving leukocytosis,  diarrhea, rectal tube in place. Awaiting trach and PEG on Monday.    Objective:     Blood pressure 136/74, pulse 103, temperature 99.5 F (37.5 C), temperature source Core, resp. rate 29, height 1.727 m (5\' 8" ), weight 115 kg (253 lb 8.5 oz), SpO2 99.00%.    General Appearance: Intubated , open eyes.    HEENT: Pallor negative, Anicteric sclera.  Intubated  Lungs:  Bilateral decreased breath sounds  Heart: Normal rate.  S1 and S2.    Chest Wall: Symmetric chest wall expansion.   Abdomen: Abdomen is soft, scaphoid and non-distended. There are no signs of ascites. Bowel sounds are normal. Rectal tube in place  Neurological: Patient is intubated, open eyes    Laboratory And Diagnostic Studies:     Recent Labs   [facility] 05/13/13 0632 05/12/13 0409    WBC 16.27* 20.17*    HGB 9.5* 10.5*    HCT 28.0* 32.2*    PLT 172 163    NEUTRO 77 --     Recent Labs   [facility] 05/13/13 0719 05/12/13 0409    NA 140 139    K 3.0* 4.3    CL 109* 109*    CO2 19* 21*    BUN 38.9* 45.7*    CREAT 1.0 1.1    GLU 188* 211*    CA 7.9* 8.1*       Current Med's:     Current Facility-Administered Medications   Medication Dose Route Frequency   . albuterol-ipratropium  3 mL Nebulization Q4H SCH   . amLODIPine  5 mg per OG tube Daily   . cefTRIAXone  2 g Intravenous Q24H SCH   . chlorhexidine  15 mL Mouth/Throat BID   . fluconazole  200 mg Intravenous Q24H SCH   . lactobacillus/streoptococcus  1 capsule Oral Daily   . linezolid  600 mg Oral Q12H SCH   . nystatin   Topical BID   . nystatin  1 application Topical BID   . pantoprazole  40 mg Intravenous Daily   . sodium chloride (PF)  10 mL Intracatheter Daily   .  sucralfate  1 g Oral Q6H   . vancomycin  125 mg Oral Q6H SCH             Assessment:      Condition.  Guarded   Septic shock; resolved   Strep pneumoniae sepsis   Coagulase-negative staph bacteremia     Respiratory failure, status post intubation   Bilateral pneumonia   Possible aspiration   Diabetes mellitus   Acute kidney injury; off dialysis   Diarrhea; C. Difficile negative     Plan:      Continue Rocephin   Continue Zyvox    Continue  Diflucan   Start empiric p.o. Vancomycin    Continue ventilatory support   Continue supportive care   Correction of electrolytes   Nephrology follow up   Possible trach and PEG on Monday          Stephania Macfarlane A Tinley Rought, M.D.,FACP  05/13/2013  9:44 AM

## 2013-05-13 NOTE — Progress Notes (Signed)
AM labs ordered.

## 2013-05-14 ENCOUNTER — Inpatient Hospital Stay: Payer: BC Managed Care – PPO

## 2013-05-14 ENCOUNTER — Encounter
Admission: EM | Disposition: A | Discharge status: Skilled Nursing Facility | Payer: Self-pay | Source: Home / Self Care | Attending: Internal Medicine

## 2013-05-14 ENCOUNTER — Encounter: Payer: Self-pay | Admitting: Certified Registered"

## 2013-05-14 ENCOUNTER — Inpatient Hospital Stay: Payer: BC Managed Care – PPO | Admitting: Certified Registered"

## 2013-05-14 HISTORY — PX: INSERTION, PEG: SHX4386

## 2013-05-14 HISTORY — PX: TRACHEOSTOMY: SHX5626

## 2013-05-14 LAB — MAN DIFF ONLY
Band Neutrophils Absolute: 1.14 10*3/uL — ABNORMAL HIGH (ref 0.00–1.00)
Band Neutrophils: 7 %
Basophils Absolute Manual: 0 10*3/uL (ref 0.00–0.20)
Basophils Manual: 0 %
Eosinophils Absolute Manual: 0.49 10*3/uL (ref 0.00–0.70)
Eosinophils Manual: 3 %
Lymphocytes Absolute Manual: 1.62 10*3/uL (ref 0.50–4.40)
Lymphocytes Manual: 10 %
Metamyelocytes Absolute: 0.16 10*3/uL — ABNORMAL HIGH
Metamyelocytes: 1 %
Monocytes Absolute: 0.81 10*3/uL (ref 0.00–1.20)
Monocytes Manual: 5 %
Neutrophils Absolute Manual: 12 10*3/uL — ABNORMAL HIGH (ref 1.80–8.10)
Nucleated RBC: 0 (ref 0–1)
Segmented Neutrophils: 74 %

## 2013-05-14 LAB — COMPREHENSIVE METABOLIC PANEL
ALT: 33 U/L (ref 0–55)
AST (SGOT): 19 U/L (ref 5–34)
Albumin/Globulin Ratio: 0.4 — ABNORMAL LOW (ref 0.9–2.2)
Albumin: 1.9 g/dL — ABNORMAL LOW (ref 3.5–5.0)
Alkaline Phosphatase: 81 U/L (ref 40–150)
Anion Gap: 10 (ref 5.0–15.0)
BUN: 31.9 mg/dL — ABNORMAL HIGH (ref 9.0–21.0)
Bilirubin, Total: 0.3 mg/dL (ref 0.2–1.2)
CO2: 22 mEq/L (ref 22–29)
Calcium: 8.1 mg/dL — ABNORMAL LOW (ref 8.5–10.5)
Chloride: 109 mEq/L — ABNORMAL HIGH (ref 98–107)
Creatinine: 0.8 mg/dL (ref 0.7–1.3)
Globulin: 4.7 g/dL — ABNORMAL HIGH (ref 2.0–3.6)
Glucose: 107 mg/dL — ABNORMAL HIGH (ref 70–100)
Potassium: 3.6 mEq/L (ref 3.5–5.1)
Protein, Total: 6.6 g/dL (ref 6.0–8.3)
Sodium: 141 mEq/L (ref 136–145)

## 2013-05-14 LAB — CBC AND DIFFERENTIAL
Hematocrit: 29.6 % — ABNORMAL LOW (ref 42.0–52.0)
Hgb: 9.6 g/dL — ABNORMAL LOW (ref 13.0–17.0)
MCH: 27.8 pg — ABNORMAL LOW (ref 28.0–32.0)
MCHC: 32.4 g/dL (ref 32.0–36.0)
MCV: 85.8 fL (ref 80.0–100.0)
MPV: 11.1 fL (ref 9.4–12.3)
Platelets: 242 10*3/uL (ref 140–400)
RBC: 3.45 10*6/uL — ABNORMAL LOW (ref 4.70–6.00)
RDW: 16 % — ABNORMAL HIGH (ref 12–15)
WBC: 16.22 10*3/uL — ABNORMAL HIGH (ref 3.50–10.80)

## 2013-05-14 LAB — MAGNESIUM: Magnesium: 1.8 mg/dL (ref 1.6–2.6)

## 2013-05-14 LAB — GLUCOSE WHOLE BLOOD - POCT
Whole Blood Glucose POCT: 129 mg/dL — ABNORMAL HIGH (ref 70–100)
Whole Blood Glucose POCT: 105 mg/dL — ABNORMAL HIGH (ref 70–100)
Whole Blood Glucose POCT: 98 mg/dL (ref 70–100)
Whole Blood Glucose POCT: 133 mg/dL — ABNORMAL HIGH (ref 70–100)
Whole Blood Glucose POCT: 177 mg/dL — ABNORMAL HIGH (ref 70–100)
Whole Blood Glucose POCT: 153 mg/dL — ABNORMAL HIGH (ref 70–100)
Whole Blood Glucose POCT: 158 mg/dL — ABNORMAL HIGH (ref 70–100)
Whole Blood Glucose POCT: 165 mg/dL — ABNORMAL HIGH (ref 70–100)
Whole Blood Glucose POCT: 147 mg/dL — ABNORMAL HIGH (ref 70–100)
Whole Blood Glucose POCT: 157 mg/dL — ABNORMAL HIGH (ref 70–100)
Whole Blood Glucose POCT: 169 mg/dL — ABNORMAL HIGH (ref 70–100)
Whole Blood Glucose POCT: 159 mg/dL — ABNORMAL HIGH (ref 70–100)
Whole Blood Glucose POCT: 176 mg/dL — ABNORMAL HIGH (ref 70–100)
Whole Blood Glucose POCT: 148 mg/dL — ABNORMAL HIGH (ref 70–100)
Whole Blood Glucose POCT: 138 mg/dL — ABNORMAL HIGH (ref 70–100)
Whole Blood Glucose POCT: 157 mg/dL — ABNORMAL HIGH (ref 70–100)
Whole Blood Glucose POCT: 152 mg/dL — ABNORMAL HIGH (ref 70–100)
Whole Blood Glucose POCT: 157 mg/dL — ABNORMAL HIGH (ref 70–100)
Whole Blood Glucose POCT: 165 mg/dL — ABNORMAL HIGH (ref 70–100)
Whole Blood Glucose POCT: 148 mg/dL — ABNORMAL HIGH (ref 70–100)
Whole Blood Glucose POCT: 148 mg/dL — ABNORMAL HIGH (ref 70–100)
Whole Blood Glucose POCT: 143 mg/dL — ABNORMAL HIGH (ref 70–100)
Whole Blood Glucose POCT: 151 mg/dL — ABNORMAL HIGH (ref 70–100)

## 2013-05-14 LAB — CELL MORPHOLOGY
Cell Morphology: ABNORMAL — AB
Platelet Estimate: NORMAL

## 2013-05-14 LAB — PT/INR
PT INR: 1
PT: 12.5 s — ABNORMAL LOW (ref 12.6–15.0)

## 2013-05-14 LAB — APTT: PTT: 51 s — ABNORMAL HIGH (ref 23–37)

## 2013-05-14 LAB — GFR: EGFR: >60

## 2013-05-14 LAB — PHOSPHORUS: Phosphorus: 3.8 mg/dL (ref 2.3–4.7)

## 2013-05-14 SURGERY — TRACHEOSTOMY
Anesthesia: Anesthesia General | Site: Neck | Wound class: Clean Contaminated

## 2013-05-14 MED ORDER — FENTANYL CITRATE 0.05 MG/ML IJ SOLN
INTRAMUSCULAR | Status: DC | PRN
Start: 2013-05-14 — End: 2013-05-14
  Administered 2013-05-14 (×2): 50 ug via INTRAVENOUS

## 2013-05-14 MED ORDER — MEPERIDINE HCL 25 MG/ML IJ SOLN
12.5000 mg | Freq: Two times a day (BID) | INTRAMUSCULAR | Status: DC | PRN
Start: 2013-05-14 — End: 2013-05-15

## 2013-05-14 MED ORDER — HYDROMORPHONE HCL PF 1 MG/ML IJ SOLN
0.5000 mg | INTRAMUSCULAR | Status: DC | PRN
Start: 2013-05-14 — End: 2013-05-16

## 2013-05-14 MED ORDER — ROCURONIUM BROMIDE 50 MG/5ML IV SOLN
INTRAVENOUS | Status: AC
Start: 2013-05-14 — End: ?
  Filled 2013-05-14: qty 5

## 2013-05-14 MED ORDER — FENTANYL CITRATE 0.05 MG/ML IJ SOLN
INTRAMUSCULAR | Status: AC
Start: 2013-05-14 — End: ?
  Filled 2013-05-14: qty 2

## 2013-05-14 MED ORDER — PROPOFOL 10 MG/ML IV EMUL
INTRAVENOUS | Status: AC
Start: 2013-05-14 — End: ?
  Filled 2013-05-14: qty 20

## 2013-05-14 MED ORDER — METOCLOPRAMIDE HCL 5 MG/ML IJ SOLN
10.0000 mg | Freq: Once | INTRAMUSCULAR | Status: DC | PRN
Start: 2013-05-14 — End: 2013-05-15

## 2013-05-14 MED ORDER — MORPHINE SULFATE 2 MG/ML IJ/IV SOLN (WRAP)
2.0000 mg | Status: DC | PRN
Start: 2013-05-14 — End: 2013-05-15

## 2013-05-14 MED ORDER — ROCURONIUM BROMIDE 50 MG/5ML IV SOLN
INTRAVENOUS | Status: DC | PRN
Start: 2013-05-14 — End: 2013-05-14
  Administered 2013-05-14: 10 mg via INTRAVENOUS
  Administered 2013-05-14: 50 mg via INTRAVENOUS
  Administered 2013-05-14: 20 mg via INTRAVENOUS

## 2013-05-14 MED ORDER — HEPARIN (PORCINE) IN D5W 50-5 UNIT/ML-% IV SOLN
18.0000 [IU]/kg/h | INTRAVENOUS | Status: DC
Start: 2013-05-14 — End: 2013-05-15
  Administered 2013-05-14: 18 [IU]/kg/h via INTRAVENOUS
  Filled 2013-05-14: qty 500

## 2013-05-14 MED ORDER — MIDAZOLAM HCL 2 MG/2ML IJ SOLN
INTRAMUSCULAR | Status: DC | PRN
Start: 2013-05-14 — End: 2013-05-14
  Administered 2013-05-14: 2 mg via INTRAVENOUS

## 2013-05-14 MED ORDER — MIDAZOLAM HCL 2 MG/2ML IJ SOLN
INTRAMUSCULAR | Status: AC
Start: 2013-05-14 — End: ?
  Filled 2013-05-14: qty 2

## 2013-05-14 MED ORDER — PROMETHAZINE HCL 25 MG/ML IJ SOLN
6.2500 mg | Freq: Once | INTRAMUSCULAR | Status: DC | PRN
Start: 2013-05-14 — End: 2013-05-15

## 2013-05-14 SURGICAL SUPPLY — 21 items
BLADE S/SU RIBBACK CARB STL 11 (Blade) ×3 IMPLANT
GLOVE SRG PLISPRN 8.5 BGL PI ULTRATOUCH (Glove) ×3
GLOVE SURGICAL 8.5 LATEX FREE POWDER FREE BEAD CUFF STERILE 301X109MM (Glove) ×2 IMPLANT
GOWN SURG MICROCOOL STRL XL (Gown) ×3 IMPLANT
KIT MINOR BREAST (Pack) ×3 IMPLANT
KIT PEG ENDOVIVE PUSH 24FR (Peg) ×3 IMPLANT
PACKET JELLY LUBRICATING 3GM (Patient Supply) ×3 IMPLANT
SHEET LAPAROTMY TRNSVRS 72X119 (Drape) ×3 IMPLANT
SOL IRR 0.9% NACL 500ML PLS PR BTL ISTNC (Irrigation Solutions) ×1
SOLN LUBRICATING JELLY 4.25OZ (Irrigation Solutions) ×3 IMPLANT
SOLUTION IRRIGATION 0.9% SDM CHLORIDE 500ML PR BTTL ISOTONIC NONPRGNC (Irrigation Solutions) ×2 IMPLANT
SOLUTION IRRIGATION 0.9% SODIUM CHLORIDE (Irrigation Solutions) ×2
SPONGE CHLRPRP TINT 26ML (Applicator) ×3 IMPLANT
SPONGE DRAIN 6PLY 4X2 AMD (Dressing) ×3 IMPLANT
SUTURE NABSB SLK 0 FSL PRMHND 30IN BRD (Suture) ×1
SUTURE PROLENE 0 CT1 30IN (Suture) ×3 IMPLANT
SUTURE SILK PERMA HAND BLACK 0 FSL L30 (Suture) ×2
SUTURE SILK PERMA HAND BLACK 0 FSL L30 IN BRAID NONABSORBABLE (Suture) ×2 IMPLANT
TUBE TRACHEOSTOMY L79 MM CUFF ROUND TIP (Tube) ×2 IMPLANT
TUBE TRACHEOSTOMY L79 MM CUFF ROUND TIP OBTURATOR DISPOSABLE INNER (Tube) ×2 IMPLANT
TUBE TRCH 8 SHLY 12.2MM 7.6MM 79MM LF (Tube) ×1 IMPLANT

## 2013-05-14 NOTE — Progress Notes (Signed)
PTT=51.  Heparin is ordered to be stopped at 0600 for surgery this a.m.  After discussing with Dr. Laural Benes the protocol will be held at this time and no bolus to be given and maintain current rate until 0600.     Heparin stopped at 0600 as ordered.

## 2013-05-14 NOTE — Op Note (Signed)
Procedure Date: 05/14/2013     Patient Type: I     SURGEON: Carma Lair MD  ASSISTANT:       PREOPERATIVE DIAGNOSIS:  Prolonged ventilatory dependence, status post acute respiratory distress  syndrome and pneumonia with result being renal failure with need of enteral  feeding option.       POSTOPERATIVE DIAGNOSES:  Prolonged ventilatory dependence, status post acute respiratory distress  syndrome and pneumonia with result being renal failure with need of enteral  feeding option.          TITLE OF PROCEDURE:  Tracheostomy placement and PEG tube placement.     ANESTHESIA:  General.     ESTIMATED BLOOD LOSS:  Minimal.     PATHOLOGY:  None.     COMPLICATIONS:  None.     CONDITION:  To recovery room in stable condition.     INDICATIONS:  This is a 48 year old obese male who presented in respiratory distress with  pneumonia resulting in ARDS as well as sepsis and acute renal failure.  The  patient was treated emergently with intubation and dialysis and has  responded to those treatment, however, the patient has undergone a  prolonged ventilatory wean and has failed ventilatory weaning trials.  The  risks and benefits of surgical intervention for tracheostomy tube as well  as PEG tube placement were discussed in detail with the patient's wife and  after this discussion, she agreed to the plan.     DESCRIPTION OF PROCEDURE:  The patient was brought to the operating room and identified as Adam Simmons.  After the administration of anesthesia, the patient's neck and  abdomen were prepped and draped in normal sterile fashion.  Attention was  paid to the tracheostomy site first.  Approximately a 2-1/2 to 3-cm  incision was made directly in the midline in the transverse position 2  fingerbreadths above the sternal notch.  The subcutaneous tissue and  platysma was opened with electrocautery, exposing the midline strap  muscles, which were opened in their avascular plane.  This exposed the  anterior surface of the trachea  as well as the isthmus of the thyroid.  The  isthmus was split with electrocautery completely exposing the anterior  surface of the trachea.  The second tracheal ring was identified and after  the ET tube was desufflated, this ring was notched with an 11 blade and  Metzenbaum scissors creating a flap/trapdoor.  This was stretched with a  tracheal spreader.  As the ET tube was pulled back and an 8 French cuffed  fenestrated tube was placed under direct visualization.  After it was  placed, there was excellent CO2 returned with little resistance and  ventilation and excellent oxygenation.  This tracheostomy was then secured  to the skin with 4-0 Prolene interrupted sutures and a tracheal tie.  After  this was complete, attention was then paid to the PEG.  An EGD scope was  used and the patient's OG tube was followed into the stomach.  There were  no gross abnormalities seen in the upper, mid, or lower esophagus.  The  stomach itself appeared normal without any evidence of ulcerative disease.   Upon insufflation of the stomach, a position for the tube was located in  the left subcostal region.  This was confirmed with transillumination and  pressure.  A small stab incision was made in this region and a needle was  placed under visualization.  The wire was placed through the needle.  The  wire was grasped with a scope and pulled through the mouth.  The PEG tube  was then placed over the wire and pulled to position with approximately a 4  cm mark on the skin and on confirming with endoscopy, revealed good  approximation to the stomach wall without too much tension.  At this time,  the scope was used to desufflate the stomach.  The securing device was  placed on the PEG tube at 4 cm.  The tube was placed to gravity.  This area  was bandaged.  The patient was then sent to the recovery room in the  intensive care unit in stable condition with all counts being correct at  the end of the case.           D:  05/14/2013 14:23  PM by Dr. [name], MD ([id number])  T:  05/14/2013 22:47 PM by       Everlean Cherry: [id number]) (Doc ID: [id number])

## 2013-05-14 NOTE — Progress Notes (Signed)
Patient had a Track and a PEG today.  Case Manager met with patients' wife and she still prefers patient to go to the [facility] facility in [place], [place].  CM talked to [place], the nurse liaison with the facility and she will start the process of authorization form pt's insurance, [employer].  As per wife she talked to pt's insurance and this facility, [facility], is in the net work.  Pt's wife understands that transportation might be an issue for insurance to approve from [place] to [place].  CM will f/u as needed.

## 2013-05-14 NOTE — Progress Notes (Signed)
[facility] [employer].  Progress Note    Assessment:  1. AKI - Due to ATN in the setting of septic shock. S/p HD on 2/8. Has since recovered. Dialysis catheter was removed. Cr holding at 1.0 mg/dl with good urine output.    2. Hypernatremia - Resolved. On maintenance 1/2 NS  2. Pneumonia with ARDS- On ventilator   3. Septic shock - Recovered off pressors   5. DM   6. HTN  7. Anemia  8. Nutrition - On tube feeds  9. Hypokalemia    Plan:  1. Supportive care  2. Replete K per ICU protocol  3. IV Heparin for Rt arm DVT  4. Trach and PEG today    Adam Drape, MD  Office - [phone]  ++++++++++++++++++++++++++++++++++++++++++++++++++++++++++++++  Subjective:  Intubated    Medications:  Scheduled Meds:  Current Facility-Administered Medications   Medication Dose Route Frequency   . albuterol-ipratropium  3 mL Nebulization Q4H SCH   . amLODIPine  5 mg per OG tube Daily   . cefTRIAXone  2 g Intravenous Q24H SCH   . chlorhexidine  15 mL Mouth/Throat BID   . fluconazole  200 mg Intravenous Q24H SCH   . lactobacillus/streoptococcus  1 capsule Oral Daily   . linezolid  600 mg Oral Q12H SCH   . nystatin   Topical BID   . nystatin  1 application Topical BID   . pantoprazole  40 mg Intravenous Daily   . sodium chloride (PF)  10 mL Intracatheter Daily   . sucralfate  1 g Oral Q6H   . vancomycin  125 mg Oral Q6H SCH     Continuous Infusions:       . sodium chloride 65 mL/hr at 05/14/13 0841   . heparin 53664 units in dextrose 5% 500 mL Stopped (05/14/13 0548)   . insulin (regular) infusion 2.5 Units/hr (05/14/13 0908)   . propofol 40 mcg/kg/min (05/14/13 0637)     PRN Meds:acetaminophen, albuterol, dextrose, dextrose, dextrose, glucagon (rDNA), heparin (porcine), heparin (porcine), magnesium sulfate, midazolam, morphine, ondansetron, potassium chloride, sodium phosphate IVPB, sodium phosphate IVPB, sodium phosphate IVPB, zinc Oxide    Objective:  Vital signs in last 24 hours:  Temp:  [99.5 F (37.5  C)-100.4 F (38 C)] 99.9 F (37.7 C)  Heart Rate:  [98-116] 101   Resp Rate:  [22-30] 22   BP: (111-143)/(63-81) 117/68 mmHg  FiO2:  [40 %-100 %] 40 %  Intake/Output last 24 hours:    Intake/Output Summary (Last 24 hours) at 05/14/13 0934  Last data filed at 05/14/13 0546   Gross per 24 hour   Intake 5791.3 ml   Output   4750 ml   Net 1041.3 ml     Intake/Output this shift:       Physical Exam:   Gen: WD M sedated   HEENT: Intubated   CV: S1 S2 N tachy reg   Chest: Clear anteriorly   Ab: ND NT soft no HSM +BS   GU: Foley   Ext: No C/E    Labs:    Lab 05/14/13 0312 05/13/13 0719 05/13/13 0409 05/12/13 0409   GLU 107* 188* -- 211*   BUN 31.9* 38.9* -- 45.7*   CREAT 0.8 1.0 -- 1.1   CA 8.1* 7.9* -- 8.1*   NA 141 140 -- 139   K 3.6 3.0* -- 4.3   CL 109* 109* -- 109*   CO2 22 19* -- 21*   ALB 1.9* -- -- --  PHOS 3.8 -- 3.4 4.2   MG 1.8 -- 1.8 1.9       Lab 05/14/13 0312 05/13/13 0632 05/12/13 0409   WBC 16.22* 16.27* 20.17*   HGB 9.6* 9.5* 10.5*   HCT 29.6* 28.0* 32.2*   MCV 85.8 86.4 85.9   MCH 27.8* 29.3 28.0   MCHC 32.4 33.9 32.6   RDW 16* 16* 16*   MPV 11.1 12.2 11.7   PLT 242 172 163

## 2013-05-14 NOTE — Progress Notes (Signed)
[facility]- Critical Care Note     ICU Daily Progress Note        Date Time: 05/14/2013 6:16 PM  Patient Name: Adam Simmons  Attending Physician: Lawernce Ion, MD  Room: IC05/IC05-A   Admit Date: 04/24/2013  LOS: 20 days            Assessment:     Patient Active Problem List   Diagnosis   . Respiratory failure   . Pneumonia   . Hypokalemia   . ARDS (adult respiratory distress syndrome)   . DM (diabetes mellitus)   . AKI (acute kidney injury)   . Pneumococcal sepsis     -s/p tracheostomy /peg for chronic resp failure, critical illness neuropathy  -K repletred  DVT R arm  Plan:   Vent support, nutrition, weaning process, rehab placement  Clinically improved.   Resume anticoagulation this evening.    Subjective:   Opens eyes, makes eye contact,    Medications:       Scheduled Meds: PRN Meds:         albuterol-ipratropium 3 mL Nebulization Q4H SCH   amLODIPine 5 mg per OG tube Daily   cefTRIAXone 2 g Intravenous Q24H SCH   chlorhexidine 15 mL Mouth/Throat BID   fluconazole 200 mg Intravenous Q24H SCH   lactobacillus/streoptococcus 1 capsule Oral Daily   linezolid 600 mg Oral Q12H SCH   nystatin  Topical BID   nystatin 1 application Topical BID   pantoprazole 40 mg Intravenous Daily   sodium chloride (PF) 10 mL Intracatheter Daily   sucralfate 1 g Oral Q6H   vancomycin 125 mg Oral Q6H SCH       Continuous Infusions:       . sodium chloride 65 mL/hr at 05/14/13 1140   . heparin 16109 units in dextrose 5% 500 mL     . insulin (regular) infusion 0.5 Units/hr (05/14/13 1758)   . propofol Stopped (05/14/13 1454)   . [DISCONTINUED] heparin 60454 units in dextrose 5% 500 mL Stopped (05/14/13 0548)         acetaminophen 500 mg Q4H PRN   albuterol 2.5 mg Q1H PRN   dextrose 15 g PRN   dextrose 25 mL PRN   dextrose 50 mL PRN   glucagon (rDNA) 1 mg PRN   magnesium sulfate 1 g PRN   midazolam 2 mg Q1H PRN   morphine 2 mg Q2H PRN   ondansetron 4 mg Q8H PRN   potassium chloride 20 mEq PRN   sodium phosphate IVPB 15 mmol PRN    sodium phosphate IVPB 25 mmol PRN   sodium phosphate IVPB 35 mmol PRN   zinc Oxide  PRN   [DISCONTINUED] heparin (porcine) 40 Units/kg (Adjusted) PRN   [DISCONTINUED] heparin (porcine) 80 Units/kg (Adjusted) PRN             Physical Exam:     Filed Vitals:    05/14/13 1555 05/14/13 1600 05/14/13 1700 05/14/13 1800   BP:  109/63 119/62 116/66   Pulse:  95 97 99   Temp:  98.7 F (37.1 C)     TempSrc:       Resp:  20 20 25    Height:       Weight:       SpO2: 99% 100% 100% 99%     Temp (24hrs), Avg:99.8 F (37.7 C), Min:98.7 F (37.1 C), Max:100.4 F (38 C)           02/22 0701 - 02/23  0700  In: 5791.3 [I.V.:3784.3]  Out: 4750 [Urine:4750]       General Appearance: comfortable on vent  Mental status: arousable  Neuro:  Generalized weakness  H & N: trach site clean, no bleeding  Lungs: basilkar rales  Cardiac: reg  Abdomen:  Peg site intact  Extremities: + edema R arm  Skin: warm      Data:       Vent Settings:    Vent Settings  Vent Mode: PRVC  FiO2: 40 %  Resp Rate (Set): 20   Vt (Set, mL): 500 mL  PIP Observed (cm H2O): 20 cm H2O  PEEP/EPAP: 5 cm H20  Pressure Support / IPAP: 10 cmH20  Mean Airway Pressure: 9 cmH20      Labs:     Recent CBC   Lab 05/14/13 0312 05/13/13 0632 05/12/13 0409   WBC 16.22* 16.27* 20.17*   RBC 3.45* 3.24* 3.75*   HGB 9.6* 9.5* 10.5*   HCT 29.6* 28.0* 32.2*   MCV 85.8 86.4 85.9   PLT 242 172 163         Lab 05/14/13 0312 05/13/13 1841 05/13/13 1129 05/13/13 0719 05/13/13 0409 05/12/13 2230 05/12/13 0409   NA 141 -- -- 140 -- -- 139   K 3.6 -- -- 3.0* -- -- 4.3   CL 109* -- -- 109* -- -- 109*   CO2 22 -- -- 19* -- -- 21*   GLU 107* -- -- 188* -- -- 211*   BUN 31.9* -- -- 38.9* -- -- 45.7*   CREAT 0.8 -- -- 1.0 -- -- 1.1   MG 1.8 -- -- -- 1.8 -- 1.9   PHOS 3.8 -- -- -- 3.4 -- 4.2   AST 19 -- -- -- -- -- --   ALT 33 -- -- -- -- -- --   ALKPHOS 81 -- -- -- -- -- --   BILITOTAL 0.3 -- -- -- -- -- --   BILIDIRECT -- -- -- -- -- -- --   LIP -- -- -- -- -- -- --   BNP -- -- -- -- -- -- --   PT  12.5* -- -- -- -- 14.2 --   INR 1.0 -- -- -- -- 1.1 --   PTT 51* 48* 45* -- -- -- --   DDIMER -- -- -- -- -- -- --   CK -- -- -- -- -- -- --   CKMB -- -- -- -- -- -- --   TROPI -- -- -- -- -- -- --   MYOGLOBIN -- -- -- -- -- -- --           Rads:     Radiology Results (24 Hour)     Procedure Component Value Units Date/Time    XR Chest AP Portable [[id number]] Collected:05/14/13 1241    Order Status:Completed  Updated:05/14/13 1246    Narrative:    CLINICAL HISTORY: Tracheostomy    AP portable view of the chest has been submitted and compared to the  prior study dated 05/12/2013.    Right lateral costophrenic angle has been excluded from the study.  Previously seen endotracheal tube and enteric tube have been removed.  There has been interval insertion of a tracheostomy tube. Right PICC  line distal tip remains in the region of the SVC. Low lung volumes  exaggerate the cardiomediastinal silhouette and crowd the  bronchovasculature in the lung bases.  No significant change in the  bibasilar patchy airspace opacities. No convincing  new focal  consolidation, large pleural effusion or obvious significant  pneumothorax is identified.      Impression:      Status post interval pacemaker of a tracheostomy tube. Persistent  bibasilar consolidations.    Latanya Maudlin, MD   05/14/2013 12:42 PM            Radiological Imaging personally reviewed.    I have personally reviewed the patient's history and 24 hour interval events, along with vitals, labs, radiology images and  ventilator settings and additional findings found in detail within ICU team notes, with their care plans developed with and reviewed by me.         Signed by: Durward Fortes, MD  Date/Time: 05/14/2013 6:16 PM

## 2013-05-14 NOTE — Progress Notes (Signed)
Infectious Disease            Progress Note    05/14/2013   Garl Speigner ZOX:[id number] is a 48 y.o. male, history of hypertension, diabetes mellitus, kidney stones. Admitted with septic shock, pneumonia, respiratory failure.    Subjective:     Adam Simmons today Symptoms: Low-grade fever, leukocytosis, improving. Trach and PEG today.    Objective:     Blood pressure 124/74, pulse 98, temperature 99.9 F (37.7 C), temperature source Core, resp. rate 23, height 1.727 m (5\' 8" ), weight 115 kg (253 lb 8.5 oz), SpO2 98.00%.    General Appearance: Intubated , open eyes.    HEENT: Pallor negative, Anicteric sclera.  Intubated  Lungs:  Bilateral decreased breath sounds  Heart: Normal rate.  S1 and S2.    Chest Wall: Symmetric chest wall expansion.   Abdomen: Abdomen is soft, scaphoid and non-distended. There are no signs of ascites. Bowel sounds are normal.  Neurological: Patient is intubated, open eyes    Laboratory And Diagnostic Studies:     Recent Labs   [facility] 05/14/13 0312 05/13/13 0632    WBC 16.22* 16.27*    HGB 9.6* 9.5*    HCT 29.6* 28.0*    PLT 242 172    NEUTRO 74 77     Recent Labs   Basename 05/14/13 0312 05/13/13 0719    NA 141 140    K 3.6 3.0*    CL 109* 109*    CO2 22 19*    BUN 31.9* 38.9*    CREAT 0.8 1.0    GLU 107* 188*    CA 8.1* 7.9*       Blood cultures:  Coagulase-negative staph  Sputum cultures: Candida species      Current Med's:     Current Facility-Administered Medications   Medication Dose Route Frequency   . albuterol-ipratropium  3 mL Nebulization Q4H SCH   . amLODIPine  5 mg per OG tube Daily   . cefTRIAXone  2 g Intravenous Q24H SCH   . chlorhexidine  15 mL Mouth/Throat BID   . fluconazole  200 mg Intravenous Q24H SCH   . lactobacillus/streoptococcus  1 capsule Oral Daily   . linezolid  600 mg Oral Q12H SCH   . nystatin   Topical BID   . nystatin  1 application Topical BID   . pantoprazole  40 mg Intravenous Daily   . sodium chloride (PF)  10 mL Intracatheter  Daily   . sucralfate  1 g Oral Q6H   . vancomycin  125 mg Oral Q6H SCH             Assessment:      Condition.  Guarded   Septic shock; resolved   Strep pneumoniae sepsis   Coagulase-negative staph bacteremia     Respiratory failure, status post intubation   Bilateral pneumonia   Possible aspiration   Diabetes mellitus   Acute kidney injury; off dialysis   Diarrhea; C. Difficile negative     Plan:      Continue Rocephin   Continue Zyvox    Continue  Diflucan   Continue  p.o. Vancomycin    Continue ventilatory support   Continue supportive care   Correction of electrolytes   Nephrology follow up   Possible trach and PEG today   Discussed with wife        Alfonzo Beers, M.D.,FACP  05/14/2013  8:11 AM

## 2013-05-14 NOTE — Anesthesia Postprocedure Evaluation (Signed)
At the conclusion of the procedure, the patient was uneventfully transported to the PACU in stable condition with good ventilatory exchange. Uneventful transition to PACU care.    At this time the patient is awake/easily arousable. The patient's respirations, and cardiovascular status have been evaluated and deemed stable. Post op nausea, vomiting and pain are being evaluated, treated and controlled as effectively as possible without compromising the patients respiratory and cardiovascular status.    Review of input and output information, assessment of cardiovascular course and current physical findings are consistent with adequate hydrational support. Please refer to PACU nursing documentation for confirmation of attainment of normothermia.    There were no anesthetic-related complications evident at this time.    The patient is recovering well and a smooth transition to the next phase of care is anticipated.

## 2013-05-14 NOTE — Progress Notes (Signed)
Patient opens eyes to name, and nods head to questions.  Continues on propofol gtt.  Report given to Dr Hyacinth Meeker, anesthesiologist for [facility] and Peg placement.  To OR at 9:45 AM, BS 157 prior to transfer, insulin gtt held.  Wife, Judeth Cornfield at bedside, Dr Hyacinth Meeker obtained consent for anesthesia.

## 2013-05-14 NOTE — Anesthesia Preprocedure Evaluation (Signed)
Anesthesia Evaluation    AIRWAY           CARDIOVASCULAR    cardiovascular exam normal       DENTAL         PULMONARY    pulmonary exam normal     OTHER FINDINGS                      Anesthesia Plan    ASA 4     general               (Pt has been on heparin drip for upper extremity DVT.  Heparin has been off since 0530 this AM.  Other labs evaluated.  RN in ICU concerned for potential DVT in LLE though no US done yet.  )      intravenous and inhalational induction   Detailed anesthesia plan: general endotracheal      Post Op: post-op ventilation  Post op pain management: per surgeon    informed consent obtained

## 2013-05-14 NOTE — PT Progress Note (Signed)
[facility]  [address]  [place], [place]. [address]    Department of Rehabilitation  [phone]    Physical Therapy Daily Treatment Note    Patient: Adam Simmons    MRN#: [id number]     Time of Treatment: Start Time: 1453 Stop Time: 1522 Time Calculation (min): 29 min    PT Visit Number: 5    Patient's medical condition is appropriate for Physical Therapy intervention at this time.    Precautions and Contraindications: Fall risk  Precautions  Other Precautions: Trach/PEG today    Assessment: Pt underwent trach and PEG procedures today. Much more alert during session but continues with decreased command following. Delayed processing time. Global weakness more evident as pt participating in therapy session today. Will require prolonged rehab stay to improve functional mobility independence prior to return home.  Assessment: Decreased UE strength;Decreased LE strength;Decreased endurance/activity tolerance;Decreased cognition;Decreased functional mobility Prognosis: Patient remains in ICU/critical care   Progress: Slow progress, medical status limitations     Plan:   Continue with Physical Therapy services to address weakness, mobility deficits. Focus next session on EOB/OOB as appropriate.  Treatment/Interventions: Neuromuscular re-education;LE strengthening/ROM;Functional transfer training;Endurance training;Cognitive reorientation;Patient/family training;Equipment eval/education;Bed mobility;Compensatory technique education   PT Frequency: 4-5x/wk     Discharge Recommendation:  (LTAC for long term vent weaning)    Subjective: Patient is agreeable to participation in the therapy session. Nursing clears patient for therapy. Pt awake, eyes open.  Nodding yes/not at times. Attempting to mouth words. Difficult to understand. Cognitive deficits still present.        Objective:  Observation of Patient/Vital Signs:  Patient is in bed with dressings, telemetry, PICC line, PEG, mechanical ventilation via  tracheostomy and condom catheter, ostomy/ileostomy in place.    Cognition  Arousal/Alertness: Delayed responses to stimuli  Attention Span: Attends to task with redirection  Following Commands: Follows one step commands with increased time;Follows one step commands with repetition;<25% for therex, simple facial expressions. Pt would not follow commands for facial expressions but when therapist stated "We are going to make funny faces" pt would make a funny face, but would not follow commands for what therapist asked (raise eyebrows, smile, stick out tongue).  Insights: Decreased awareness of deficits    Therapeutic Exercise- AA/PROM as below.  Shoulder flexion/extension Bx3 PROM  Elbow flexion/extension Bx3-5. AAROM occasionally for elbow extension. PROM flexion  Wrist flexion/extension Bx3-5 AAROM extension consistently B. PROM flexion  Finger flexion/extension Bx3. Pt able to wiggle fingers slightly. Slight squeeze L hand.    Hip/knee flexion Bx5, PROM flexion, slight AAROM extension  Hip abd/add Bx5, PROM, slight contraction noted L adduction  Hip IR/ER Bx5 PROM  Ankle DF/PF Bx5 A/AAROM    Heel cord stretch B x30 sec  Piriformis stretch B x10-20 sec. Grimaced with B IR.       Treatment Activities: ROM as above. Pt able to assist with some movements. Trace contraction noted in most of muscle groups. At times ?motivation vs weakness. Globally weak but pt with poor attention still. Continues with some delirium. Appeared to ask where he was/what was happening. Reoriented to situation. Explained importance of assisting and participating in therapy and during mobility with RN to improve strength. Wife present through most of session. Encouraged to assist pt with ROM as well.    Educated the patient to role of physical therapy, plan of care, goals of therapy and HEP, safety with mobility and ADLs.    At end of session pt supine in bed, all  medical equipment intact. RN aware. Wife present.      Delfin Edis, PT,  DPT  Pager #: [phone]

## 2013-05-14 NOTE — Progress Notes (Signed)
Nutritional Support Services  Nutrition Follow Up    Adam Simmons 48 y.o. male   MRN: [id number]    Nutrition Summary/Diet history:  Pt is vented and sedated.  s/p PEG/trach.      Nutrition Diagnosis:   Inadequate oral intake related to respiratory failure as evidenced by vent support.    Intervention:  1. Continue with current feeds.     Goal: Meet estimated needs with enteral nutrition support while vented.    Monitoring:   Evaluation:   1. Residuals < 500 mL < 80 mL  2. Weights   - 16.6 kg in the last 2 weeks  3. Net I&O   + 6.8 L since admission    Nutrition risk level: Moderate    Assessment Data:  Adm dx:  Respiratory failure   Patient Active Problem List   Diagnosis   . Respiratory failure   . Pneumonia   . Hypokalemia   . ARDS (adult respiratory distress syndrome)   . DM (diabetes mellitus)   . AKI (acute kidney injury)   . Pneumococcal sepsis   PMH:  has a past medical history of Hypertension; Diabetes mellitus; and Kidney stones.  PSH:  has past surgical history that includes Kidney stone surgery; TRACHEOSTOMY (05/14/2013); and INSERTION, PEG (05/14/2013).  Pertinent labs:   Lab 05/14/13 0312 05/13/13 0719 05/13/13 0409 05/12/13 0409 05/11/13 2148 05/11/13 1058 05/11/13 0425 05/10/13 0319   NA 141 140 -- 139 138 -- 142 --   K 3.6 3.0* -- 4.3 3.6 3.5 -- --   CL 109* 109* -- 109* 108* -- 110* --   CO2 22 19* -- 21* 21* -- 21* --   BUN 31.9* 38.9* -- 45.7* 42.7* -- 47.6* --   CREAT 0.8 1.0 -- 1.1 1.0 -- 1.0 --   GLU 107* 188* -- 211* 118* -- 113* --   CA 8.1* 7.9* -- 8.1* 8.2* -- 8.0* --   MG 1.8 -- 1.8 1.9 1.6 -- -- 1.9   PHOS 3.8 -- 3.4 4.2 3.3 -- -- 3.7   EGFR >60.0 >60.0 -- >60.0 >60.0 -- >60.0 --   Blood glucose range (48 hrs) 107-188 mg/dL (improving)  Pertinent meds: chloprhexidine, lactobacillus/streoptococcus, pantoprazole, sucralfate  Social history:  married  Food Intolerance/Religious/Ethnic preferences: none noted  Diet Order:  Orders Placed This Encounter   Procedures   . Diet NPO time specified    . Prosource no carb Frequency:: BID with meals; Quantity:: A. One   Enteral nutrition:   Indication: vented  Route: PEG  Frequency: continuous   Formula: Promote    Add Rate: 70 mL/hr   Formula provides: 1680 kcals, 105 g pro and 1409 mL free water  Additives: ProSource NoCarb 1 packet bid = 120 kcals and 30 g pro/d. (TF+ ProSource = 1800 kcals and 135 g pro/d)   Water flushes: 150 mL q 4 hrs  Residuals: < 80 mL   GI symptoms: abd distended, diarrhea  Hydration: non pitting edema, 1/2 NS @ 65 mL/hr  Skin: Stage II sacrum, II buttock, abrasions  Anthropometrics  Height: 172.7 cm (5\' 8" )  Weight: 115 kg (253 lb 8.5 oz)  Weight Change: -2.29   IBW/kg (Calculated) Male: 70.02 kg  IBW/kg (Calculated) Male: 63.62 kg  BMI (calculated): 42.6   Wt Readings from Last 30 Encounters:   05/12/13 115 kg (253 lb 8.5 oz)   05/12/13 115 kg (253 lb 8.5 oz)   05/12/13 115 kg (253 lb 8.5 oz)   05/12/13 115  kg (253 lb 8.5 oz)   Learning Needs: no  Estimated Needs:  Estimated Energy Needs  Total Energy Estimated Needs: 1680-1820 kcals/kg  Method for Estimating Needs: 24-26 kcals/kg IBW  Estimated Protein Needs  Total Protein Estimated Needs: 140 g pro/kg  Method for Estimating Needs: 2 g protein/kg  Fluid Needs  Method for Estimating Needs: per MD  No Known Allergies    Geryl Councilman, RD, CNSC, CSO  [facility] - [phone]

## 2013-05-14 NOTE — Brief Op Note (Signed)
BRIEF OP NOTE    Date Time: 05/14/2013 11:06 AM    Patient Name:   Adam Simmons    Date of Operation:   04/24/2013 - 05/14/2013    Providers Performing:   Surgeon(s):  Alen Blew, MD    Assistant (s):   Jodi Geralds, RN - Circulator  Luella Cook, RN - Scrub Person  Lannette Donath - First Assistant    Operative Procedure:   Procedure(s):  TRACHEOSTOMY  INSERTION, PEG    Preoperative Diagnosis:   Pre-Op Diagnosis Codes:     * Acute respiratory failure [518.81]    Postoperative Diagnosis:   Acute respiratory failure [518.81][    Anesthesia:   General    Estimated Blood Loss:    * No values recorded between 05/14/2013 10:18 AM and 05/14/2013 11:06 AM *    Implants:     Implant Name Type Inv. Item Serial No. Manufacturer Lot No. LRB No. Used Action   KIT PEG ENDOVIVE PUSH 24FR - [id number] Peg KIT PEG ENDOVIVE PUSH 24FR   [id number] N/A 1 Implanted   TUBE TRACH 86F SZ8 CUFFED - [id number] Tube TUBE TRACH 86F SZ8 CUFFED     [id number] N/A 1 Implanted       Drains:   Drains: Yes, Drain #1: Gastric Gastrostomy 24 Fr    Specimens:       Findings:       Complications:         Signed by: Alen Blew, MD                                                                           [place] [facility] OR

## 2013-05-14 NOTE — Transfer of Care (Signed)
Anesthesia Transfer of Care Note    Patient: Adam Simmons    Procedures performed: Procedure(s) with comments:  TRACHEOSTOMY - PEG AND TRACH PLACEMENT(PT IN ICU)  INSERTION, PEG    Anesthesia type: General ETT    Patient location:ICU    Last vitals:   Filed Vitals:    05/14/13 1122   BP: 152/78   Pulse: 108   Temp:    Resp: 15   SpO2: 99%       Post pain: Patient not complaining of pain, continue current therapy      Mental Status:awake    Respiratory Function: tracheostomy    Cardiovascular: stable    Nausea/Vomiting: patient not complaining of nausea or vomiting    Hydration Status: adequate    Post assessment: no apparent anesthetic complications, no reportable events and no evidence of recall

## 2013-05-14 NOTE — Progress Notes (Signed)
Patient off propofol gtt @ 14:54, awake, following commands, respirations low 20's, HR low 90's.

## 2013-05-15 LAB — GLUCOSE WHOLE BLOOD - POCT
Whole Blood Glucose POCT: 149 mg/dL — ABNORMAL HIGH (ref 70–100)
Whole Blood Glucose POCT: 156 mg/dL — ABNORMAL HIGH (ref 70–100)
Whole Blood Glucose POCT: 156 mg/dL — ABNORMAL HIGH (ref 70–100)
Whole Blood Glucose POCT: 163 mg/dL — ABNORMAL HIGH (ref 70–100)
Whole Blood Glucose POCT: 166 mg/dL — ABNORMAL HIGH (ref 70–100)
Whole Blood Glucose POCT: 167 mg/dL — ABNORMAL HIGH (ref 70–100)
Whole Blood Glucose POCT: 175 mg/dL — ABNORMAL HIGH (ref 70–100)
Whole Blood Glucose POCT: 175 mg/dL — ABNORMAL HIGH (ref 70–100)
Whole Blood Glucose POCT: 179 mg/dL — ABNORMAL HIGH (ref 70–100)
Whole Blood Glucose POCT: 179 mg/dL — ABNORMAL HIGH (ref 70–100)
Whole Blood Glucose POCT: 180 mg/dL — ABNORMAL HIGH (ref 70–100)
Whole Blood Glucose POCT: 183 mg/dL — ABNORMAL HIGH (ref 70–100)
Whole Blood Glucose POCT: 189 mg/dL — ABNORMAL HIGH (ref 70–100)
Whole Blood Glucose POCT: 190 mg/dL — ABNORMAL HIGH (ref 70–100)
Whole Blood Glucose POCT: 205 mg/dL — ABNORMAL HIGH (ref 70–100)

## 2013-05-15 LAB — MAN DIFF ONLY
Band Neutrophils Absolute: 0.43 10*3/uL (ref 0.00–1.00)
Band Neutrophils: 3 %
Basophils Absolute Manual: 0.14 10*3/uL (ref 0.00–0.20)
Basophils Manual: 1 %
Eosinophils Absolute Manual: 0.72 10*3/uL — ABNORMAL HIGH (ref 0.00–0.70)
Eosinophils Manual: 5 %
Lymphocytes Absolute Manual: 1.29 10*3/uL (ref 0.50–4.40)
Lymphocytes Manual: 9 %
Monocytes Absolute: 1 10*3/uL (ref 0.00–1.20)
Monocytes Manual: 7 %
Neutrophils Absolute Manual: 10.74 10*3/uL — ABNORMAL HIGH (ref 1.80–8.10)
Nucleated RBC: 0 (ref 0–1)
Segmented Neutrophils: 75 %

## 2013-05-15 LAB — COMPREHENSIVE METABOLIC PANEL
ALT: 30 U/L (ref 0–55)
AST (SGOT): 20 U/L (ref 5–34)
Albumin/Globulin Ratio: 0.5 — ABNORMAL LOW (ref 0.9–2.2)
Albumin: 2 g/dL — ABNORMAL LOW (ref 3.5–5.0)
Alkaline Phosphatase: 86 U/L (ref 40–150)
Anion Gap: 9 (ref 5.0–15.0)
BUN: 25.9 mg/dL — ABNORMAL HIGH (ref 9.0–21.0)
Bilirubin, Total: 0.5 mg/dL (ref 0.2–1.2)
CO2: 23 mEq/L (ref 22–29)
Calcium: 7.8 mg/dL — ABNORMAL LOW (ref 8.5–10.5)
Chloride: 107 mEq/L (ref 98–107)
Creatinine: 0.8 mg/dL (ref 0.7–1.3)
Globulin: 4.2 g/dL — ABNORMAL HIGH (ref 2.0–3.6)
Glucose: 178 mg/dL — ABNORMAL HIGH (ref 70–100)
Potassium: 3.4 mEq/L — ABNORMAL LOW (ref 3.5–5.1)
Protein, Total: 6.2 g/dL (ref 6.0–8.3)
Sodium: 139 mEq/L (ref 136–145)

## 2013-05-15 LAB — CELL MORPHOLOGY
Cell Morphology: ABNORMAL — AB
Platelet Estimate: NORMAL

## 2013-05-15 LAB — CBC AND DIFFERENTIAL
Hematocrit: 29.1 % — ABNORMAL LOW (ref 42.0–52.0)
Hgb: 9.4 g/dL — ABNORMAL LOW (ref 13.0–17.0)
MCH: 28.2 pg (ref 28.0–32.0)
MCHC: 32.3 g/dL (ref 32.0–36.0)
MCV: 87.4 fL (ref 80.0–100.0)
MPV: 10.8 fL (ref 9.4–12.3)
Platelets: 225 10*3/uL (ref 140–400)
RBC: 3.33 10*6/uL — ABNORMAL LOW (ref 4.70–6.00)
RDW: 17 % — ABNORMAL HIGH (ref 12–15)
WBC: 14.32 10*3/uL — ABNORMAL HIGH (ref 3.50–10.80)

## 2013-05-15 LAB — PHOSPHORUS: Phosphorus: 3.1 mg/dL (ref 2.3–4.7)

## 2013-05-15 LAB — APTT
PTT: 34 s (ref 23–37)
PTT: 71 s — ABNORMAL HIGH (ref 23–37)

## 2013-05-15 LAB — MAGNESIUM: Magnesium: 1.6 mg/dL (ref 1.6–2.6)

## 2013-05-15 LAB — GFR: EGFR: >60

## 2013-05-15 MED ORDER — HEPARIN (PORCINE) IN D5W 50-5 UNIT/ML-% IV SOLN
18.0000 [IU]/kg/h | INTRAVENOUS | Status: DC
Start: 2013-05-15 — End: 2013-05-20
  Administered 2013-05-15: 22 [IU]/kg/h via INTRAVENOUS
  Administered 2013-05-16: 26 [IU]/kg/h via INTRAVENOUS
  Administered 2013-05-16: 22 [IU]/kg/h via INTRAVENOUS
  Administered 2013-05-17 (×2): 28 [IU]/kg/h via INTRAVENOUS
  Administered 2013-05-17: 30 [IU]/kg/h via INTRAVENOUS
  Administered 2013-05-18 – 2013-05-19 (×4): 28 [IU]/kg/h via INTRAVENOUS
  Administered 2013-05-20: 25 [IU]/kg/h via INTRAVENOUS
  Filled 2013-05-15 (×11): qty 500

## 2013-05-15 MED ORDER — WARFARIN SODIUM 5 MG PO TABS
5.0000 mg | ORAL_TABLET | Freq: Every day | ORAL | Status: DC
Start: 2013-05-15 — End: 2013-05-17
  Administered 2013-05-15 – 2013-05-16 (×2): 5 mg via ORAL
  Filled 2013-05-15 (×2): qty 1

## 2013-05-15 MED ORDER — GLUCOSE 40 % PO GEL
15.0000 g | ORAL | Status: DC | PRN
Start: 2013-05-15 — End: 2013-05-23

## 2013-05-15 MED ORDER — INSULIN GLARGINE 100 UNIT/ML SC SOLN
10.0000 [IU] | Freq: Every day | SUBCUTANEOUS | Status: DC
Start: 2013-05-15 — End: 2013-05-15

## 2013-05-15 MED ORDER — HEPARIN SODIUM (PORCINE) 5000 UNIT/ML IJ SOLN
80.0000 [IU]/kg | INTRAMUSCULAR | Status: DC | PRN
Start: 2013-05-15 — End: 2013-05-20
  Administered 2013-05-15: 6950 [IU] via INTRAVENOUS
  Administered 2013-05-16: 80 [IU] via INTRAVENOUS
  Filled 2013-05-15 (×2): qty 2

## 2013-05-15 MED ORDER — GLUCAGON HCL (RDNA) 1 MG IJ SOLR
1.0000 mg | INTRAMUSCULAR | Status: DC | PRN
Start: 2013-05-15 — End: 2013-05-23

## 2013-05-15 MED ORDER — HEPARIN SODIUM (PORCINE) 5000 UNIT/ML IJ SOLN
80.0000 [IU]/kg | Freq: Once | INTRAMUSCULAR | Status: DC
Start: 2013-05-15 — End: 2013-05-20

## 2013-05-15 MED ORDER — INSULIN GLARGINE 100 UNIT/ML SC SOLN
20.0000 [IU] | Freq: Every day | SUBCUTANEOUS | Status: DC
Start: 2013-05-15 — End: 2013-05-23
  Administered 2013-05-15 – 2013-05-23 (×9): 20 [IU] via SUBCUTANEOUS
  Filled 2013-05-15 (×10): qty 200

## 2013-05-15 MED ORDER — HEPARIN SODIUM (PORCINE) 5000 UNIT/ML IJ SOLN
40.0000 [IU]/kg | INTRAMUSCULAR | Status: DC | PRN
Start: 2013-05-15 — End: 2013-05-20
  Administered 2013-05-17 (×2): 3500 [IU] via INTRAVENOUS
  Filled 2013-05-15 (×3): qty 1

## 2013-05-15 MED ORDER — DEXTROSE 50 % IV SOLN
25.0000 mL | INTRAVENOUS | Status: DC | PRN
Start: 2013-05-15 — End: 2013-05-23

## 2013-05-15 MED ORDER — INSULIN ASPART 100 UNIT/ML SC SOLN
1.0000 [IU] | SUBCUTANEOUS | Status: DC
Start: 2013-05-15 — End: 2013-05-22
  Administered 2013-05-15 – 2013-05-16 (×3): 2 [IU] via SUBCUTANEOUS
  Administered 2013-05-16 (×2): 4 [IU] via SUBCUTANEOUS
  Administered 2013-05-16 (×2): 2 [IU] via SUBCUTANEOUS
  Administered 2013-05-16: 4 [IU] via SUBCUTANEOUS
  Administered 2013-05-17: 7 [IU] via SUBCUTANEOUS
  Administered 2013-05-17: 4 [IU] via SUBCUTANEOUS
  Administered 2013-05-17: 2 [IU] via SUBCUTANEOUS
  Administered 2013-05-17: 4 [IU] via SUBCUTANEOUS
  Administered 2013-05-17 – 2013-05-18 (×6): 2 [IU] via SUBCUTANEOUS
  Administered 2013-05-18: 4 [IU] via SUBCUTANEOUS
  Administered 2013-05-19 (×4): 2 [IU] via SUBCUTANEOUS
  Administered 2013-05-19: 4 [IU] via SUBCUTANEOUS
  Administered 2013-05-19 – 2013-05-20 (×5): 2 [IU] via SUBCUTANEOUS
  Administered 2013-05-20: 4 [IU] via SUBCUTANEOUS
  Administered 2013-05-20 – 2013-05-21 (×6): 2 [IU] via SUBCUTANEOUS
  Administered 2013-05-22 (×3): 4 [IU] via SUBCUTANEOUS
  Administered 2013-05-22: 2 [IU] via SUBCUTANEOUS
  Filled 2013-05-15: qty 40
  Filled 2013-05-15 (×2): qty 20
  Filled 2013-05-15: qty 40
  Filled 2013-05-15 (×4): qty 20
  Filled 2013-05-15: qty 40
  Filled 2013-05-15 (×2): qty 20
  Filled 2013-05-15: qty 40
  Filled 2013-05-15 (×3): qty 20
  Filled 2013-05-15: qty 40
  Filled 2013-05-15: qty 20
  Filled 2013-05-15: qty 40
  Filled 2013-05-15 (×4): qty 20
  Filled 2013-05-15: qty 40
  Filled 2013-05-15 (×5): qty 20
  Filled 2013-05-15: qty 40
  Filled 2013-05-15 (×4): qty 20
  Filled 2013-05-15: qty 70
  Filled 2013-05-15: qty 40
  Filled 2013-05-15: qty 20
  Filled 2013-05-15: qty 40
  Filled 2013-05-15: qty 20
  Filled 2013-05-15: qty 30
  Filled 2013-05-15: qty 20

## 2013-05-15 NOTE — Progress Notes (Signed)
Pt's wife allowed to talk to her friend, Auburn Bilberry [phone]), who works at the [employer] in [place] and he is trying to help pt's wife to find more resources in the [place] as well.  CM explained to Mr. Lyda Jester that patient needs lower level of care from the acute Hospital to get weaned off the vent and Trach.  He understands and will f/u with the CM if he finds any facility to accept pt's insurance.        Pt's wife told the CM that she only wants her husband to go to the [facility].  Patients wife yelled at the Case Manager and verbalized that she does not want her husband (Patient) to go to the [facility] at all.        Case Manager tried to call patient's insurance at [phone] as well as [phone] to talked to the benefit dept and check which facilities ([facility] facilities are in the network).  The [facility] office is closed due to the inclement weather as per recorded message.  CM will f/u tomorrow again with insurance as needed.

## 2013-05-15 NOTE — Progress Notes (Signed)
Nutrition Support Services  Follow Up    ICU RN Clydie Braun called to inquire as to changing TF order from Promote to improve glucose management.  Agree with changing feeds to Glucerna 1.0 @ 75 mL/hr with ProSource NoCarb 3 packets/d = 1980 kcals, 120 g pro and 1535 mL free water.  Continue with free water flush of 150 mL q 4 hrs.    Geryl Councilman, RD, CNSC, CSO  [place] - [phone]

## 2013-05-15 NOTE — Progress Notes (Signed)
[facility], [employer].  Progress Note    Assessment:    -AKI due to ATN resolved, required HD for one treatment.  Serum creatinine very stable at 0.8 mg/dL with good metabolic control and excellent UOP  -BP stable   -DM   -Anemia     Plan:    -supportive care   -replete electrolytes as needed  -labs   -avoid nephrotoxins     Adam Medin, MD  Office [phone]  ++++++++++++++++++++++++++++++++++++++++++++++++++++++++++++++  Subjective:    Awake, trached, appears very confused     Medications:  Scheduled Meds:  Current Facility-Administered Medications   Medication Dose Route Frequency   . albuterol-ipratropium  3 mL Nebulization Q4H SCH   . cefTRIAXone  2 g Intravenous Q24H SCH   . chlorhexidine  15 mL Mouth/Throat BID   . fluconazole  200 mg Intravenous Q24H SCH   . heparin (porcine)  80 Units/kg (Adjusted) Intravenous Once   . insulin aspart  1-12 Units Subcutaneous Q4H   . insulin glargine  20 Units Subcutaneous Daily   . lactobacillus/streoptococcus  1 capsule Oral Daily   . nystatin   Topical BID   . nystatin  1 application Topical BID   . pantoprazole  40 mg Intravenous Daily   . sodium chloride (PF)  10 mL Intracatheter Daily   . sucralfate  1 g Oral Q6H   . warfarin  5 mg Oral Daily at 1800   . [DISCONTINUED] amLODIPine  5 mg per OG tube Daily   . [DISCONTINUED] insulin glargine  10 Units Subcutaneous Daily   . [DISCONTINUED] linezolid  600 mg Oral Q12H SCH   . [DISCONTINUED] vancomycin  125 mg Oral Q6H SCH     Continuous Infusions:     . sodium chloride 65 mL/hr at 05/14/13 1140   . heparin 09811 units in dextrose 5% 500 mL 22 Units/kg/hr (05/15/13 1151)   . [DISCONTINUED] heparin 91478 units in dextrose 5% 500 mL 18 Units/kg/hr (05/14/13 2304)   . [DISCONTINUED] insulin (regular) infusion 1.5 Units/hr (05/15/13 1100)   . [DISCONTINUED] propofol Stopped (05/14/13 1454)     PRN Meds:acetaminophen, albuterol, dextrose, dextrose, glucagon (rDNA), heparin (porcine), heparin  (porcine), HYDROmorphone, magnesium sulfate, midazolam, morphine, ondansetron, potassium chloride, sodium phosphate IVPB, sodium phosphate IVPB, sodium phosphate IVPB, zinc Oxide, [DISCONTINUED] dextrose, [DISCONTINUED] dextrose, [DISCONTINUED] dextrose, [DISCONTINUED] glucagon (rDNA), [DISCONTINUED] meperidine, [DISCONTINUED] metoclopramide  [DISCONTINUED] morphine, [DISCONTINUED] promethazine    Objective:  Vital signs in last 24 hours:  Temp:  [96.8 F (36 C)-98.7 F (37.1 C)] 97 F (36.1 C)  Heart Rate:  [87-103] 103   Resp Rate:  [20-27] 27   BP: (109-150)/(56-84) 126/71 mmHg  FiO2:  [40 %-100 %] 40 %  Intake/Output last 24 hours:    Intake/Output Summary (Last 24 hours) at 05/15/13 1535  Last data filed at 05/15/13 1400   Gross per 24 hour   Intake 1787.08 ml   Output   3950 ml   Net -2162.92 ml     Intake/Output this shift:  I/O this shift:  In: -   Out: 950 [Urine:950]    Physical Exam:   Gen: trached    CV: S1 S2 N   Chest: CTAB   Ab: ND NT    Ext: No C/E    Labs:    Lab 05/15/13 0504 05/14/13 0312 05/13/13 0719 05/13/13 0409   GLU 178* 107* 188* --   BUN 25.9* 31.9* 38.9* --   CREAT 0.8 0.8 1.0 --  CA 7.8* 8.1* 7.9* --   NA 139 141 140 --   K 3.4* 3.6 3.0* --   CL 107 109* 109* --   CO2 23 22 19* --   ALB 2.0* 1.9* -- --   PHOS 3.1 3.8 -- 3.4   MG 1.6 1.8 -- 1.8       Lab 05/15/13 0504 05/14/13 0312 05/13/13 0632   WBC 14.32* 16.22* 16.27*   HGB 9.4* 9.6* 9.5*   HCT 29.1* 29.6* 28.0*   MCV 87.4 85.8 86.4   MCH 28.2 27.8* 29.3   MCHC 32.3 32.4 33.9   RDW 17* 16* 16*   MPV 10.8 11.1 12.2   PLT 225 242 172

## 2013-05-15 NOTE — Progress Notes (Signed)
Case Manager faxed additional progress notes and lab values to [employer] (tel [phone]) at the fax # [phone] as she requested.  She will let the CM know when she hears back from the insurance company.  CM will f/u as needed.

## 2013-05-15 NOTE — PT Progress Note (Signed)
[facility]  [address]  [place], [place]. [address]    Department of Rehabilitation  [phone]    Physical Therapy Daily Treatment Note    Patient: Adam Simmons    MRN#: [id number]     Time of Treatment: Start Time: 1247 Stop Time: 1318 Time Calculation (min): 31 min    PT Visit Number: 6    Patient's medical condition is appropriate for Physical Therapy intervention at this time.    Precautions and Contraindications: Fall risk  Precautions  Other Precautions: trach/peg    Assessment: Pt with improved cognition and participation during session today. On PS/CPAP trial during session but respiratory status remained stable. Continues to require total A for all mobility. Will require prolonged rehab due to global deficits from prolonged immobility.  Assessment: Decreased UE strength;Decreased LE strength;Decreased endurance/activity tolerance;Decreased functional mobility;Decreased balance Prognosis: Good;With continued PT status post acute discharge;Patient remains in ICU/critical care   Progress: Progressing toward goals     Patient Goal: To go home      Plan:   Continue with Physical Therapy services to address weakness, mobility deficits. Focus next session on therex, sitting balance, OOB.  Treatment/Interventions: Neuromuscular re-education;Functional transfer training;LE strengthening/ROM;Endurance training;Cognitive reorientation;Bed mobility;Equipment eval/education;Patient/family training   PT Frequency: 4-5x/wk     Discharge Recommendation:  (LTAC for long term vent weaning)      Subjective: Patient is agreeable to participation in the therapy session. Patient is unable to indicate agreement for the therapy session but is able to participate in the selected activities. Nursing clears patient for therapy. RN cleared pt to get OOB. On PS/CPAP trial. Pt able to mouth words appropriately and no head yes/no during session.        Objective:  Observation of Patient/Vital Signs:  Patient is in bed  with telemetry, peripheral IV, mechanical ventilation via tracheostomy-on PS/CPAP trial and PEG tube, indwelling urinary catheter, rectal tube in place.    Cognition  Arousal/Alertness: Appropriate responses to stimuli  Attention Span: Appears intact  Following Commands: Follows one step commands without difficulty  Insights: Fully aware of deficits    Functional Mobility  Rolling: Dependent (Rolled B x2 for pericare and lift pad placement)  Transfers  Bed to Chair: Dependent (Using hoyer lift)       Therapeutic Exercise  Heelslides: Bx10, AAROM  Knee AROM Short Arc Quad: Bx10 AAROM  Ankle Pumps: Bx10 AAROM  Instruction for proper pacing for therex. No c/o pain during exercises.       Neuro Re-Ed  Sitting Balance:  (Total A to bring shoulders forward off backrest to reposition in chair)     Treatment Activities: Mobility and therex as above. Vitals stable throughout session/transfer despite being on PS/CPAP trial. Pt more motivated and participatory during session today.     Educated the patient to role of physical therapy, plan of care, goals of therapy and HEP, safety with mobility and ADLs.    At end of session pt seated upright in chair, RN aware. Wife present.      Delfin Edis, PT, DPT  Pager #: [phone]

## 2013-05-15 NOTE — Progress Notes (Signed)
Infectious Disease            Progress Note    05/15/2013   Adam Simmons [id number] is a 48 y.o. male, history of hypertension, diabetes mellitus, kidney stones. Admitted with septic shock, pneumonia, respiratory failure.    Subjective:     Adam Simmons today Symptoms: Afebrile, status post tracheostomy and G-tube placement, improving leukocytosis    Objective:     Blood pressure 110/69, pulse 93, temperature 97.6 F (36.4 C), temperature source Temporal Artery, resp. rate 20, height 1.727 m (5\' 8" ), weight 115 kg (253 lb 8.5 oz), SpO2 98.00%.    General Appearance: Awake, responsive    HEENT: Pallor negative, Anicteric sclera.  status post tracheostomy  Lungs:  Bilateral decreased breath sounds  Heart: Normal rate.  S1 and S2.    Chest Wall: Symmetric chest wall expansion.   Abdomen: Abdomen is soft, scaphoid and non-distended. There are no signs of ascites. Bowel sounds are normal.G-tube in place in place  Neurological: Patient is awake responsive    Laboratory And Diagnostic Studies:     Recent Labs   [facility] 05/15/13 0504 05/14/13 0312    WBC 14.32* 16.22*    HGB 9.4* 9.6*    HCT 29.1* 29.6*    PLT 225 242    NEUTRO 75 74     Recent Labs   Basename 05/15/13 0504 05/14/13 0312    NA 139 141    K 3.4* 3.6    CL 107 109*    CO2 23 22    BUN 25.9* 31.9*    CREAT 0.8 0.8    GLU 178* 107*    CA 7.8* 8.1*       Blood cultures:  Coagulase-negative staph  Sputum cultures: Candida species      Current Med's:     Current Facility-Administered Medications   Medication Dose Route Frequency   . albuterol-ipratropium  3 mL Nebulization Q4H SCH   . amLODIPine  5 mg per OG tube Daily   . cefTRIAXone  2 g Intravenous Q24H SCH   . chlorhexidine  15 mL Mouth/Throat BID   . fluconazole  200 mg Intravenous Q24H SCH   . heparin (porcine)  80 Units/kg (Adjusted) Intravenous Once   . lactobacillus/streoptococcus  1 capsule Oral Daily   . linezolid  600 mg Oral Q12H SCH   . nystatin   Topical BID   . nystatin   1 application Topical BID   . pantoprazole  40 mg Intravenous Daily   . sodium chloride (PF)  10 mL Intracatheter Daily   . sucralfate  1 g Oral Q6H   . vancomycin  125 mg Oral Q6H SCH             Assessment:      Condition.  Guarded   Septic shock; resolved   Strep pneumoniae sepsis   Coagulase-negative staph bacteremia     Respiratory failure, status post tracheostomy   Bilateral pneumonia   Possible aspiration   Diabetes mellitus   Acute kidney injury; off dialysis   Diarrhea; C. Difficile negative     Plan:      Continue Rocephin   Discontinue  Zyvox    Continue  Diflucan   Discontinue   p.o. Vancomycin    Continue supportive care   Correction of electrolytes   Nephrology follow up   Discussed with wife   Discussed with Dr.Lee        Alfonzo Beers, M.D.,FACP  05/15/2013  8:04  AM

## 2013-05-15 NOTE — Progress Notes (Signed)
[facility]- Critical Care Note     ICU Daily Progress Note        Date Time: 05/15/2013 11:26 AM  Patient Name: Adam Simmons  Attending Physician: Lawernce Ion, MD  Room: IC05/IC05-A   Admit Date: 04/24/2013  LOS: 21 days        Assessment/Plan:   # Neurologic: sedation vacation. Critical care myopathy. Diffuse generalized weakness.     # Respiratory: multilobar Strep Pneumonia pneumonia, resolved ARDS.  Still tachypnic on ventilator. Cont chest physiotherapy and mucomyst. S/p trach placement. Awaiting placement. Pt will benefit from vent weaning facility.    # Nutrition: appreciate nutritionist recommendation, cont tube feeds via peg    # Endocrine: Diabetes; uncontrolled, cont insulin gtt    # Cardiac: septic shock resolved. Off pressors. Echo indicated EF 50%    #renal: acute kidney injury stable, appreciate Dr. Dalbert Batman group recs. Cont free water bolus q4hrs. Cont d5W.     # Infectious diseases: streptococcus pneumonia bacteremia/ pneumonia.  Appreciate Dr. Myrtis Ser consultation and evaluation. Continue antibiotics    #Right arm DVT: cont heparin gtt. Start coumadin tonight. Check daily inr.     Code Status: full code     I have personally reviewed the patient's history and 24 hour interval events, along with vitals, labs, radiology images, and nurses report.      discussed with patient's wife at the bedside and answered her questions.        Subjective:     48 y.o. male h/o DM type 2, HTN who presents to the hospital with respiratory failure, from streptococcus pneumonia, ARDS, AKI and septic shock.      patient alert, diffusely weak    Hospital Course  2/3   ARDS / PNA / Sepsis  w/ resp failure- intubated,  Flu test-neg  Resp c+s= gm + cocci , x2BC= gm + cocci resembling strep  2/6 [place] placed for worsening creatinine, 14 L positive  2/8 dialysis  2/10 failed SBT  2/11 tachypnic, readjusted ventilator tidal volume.   2/13 hemodialysis catheter removed, bronchoscopy with improved  pulmonary toilet  2/15 pulmonary toilet, mucomyst, chest PT vest  2/17 fever 102F, cooling blanket  2/23 trach shiley #8, cuffed, peg   2/24 pressure support trial 10/5 x 2hrs tolerated    Medications:   Scheduled Meds:  Current Facility-Administered Medications   Medication Dose Route Frequency   . albuterol-ipratropium  3 mL Nebulization Q4H SCH   . amLODIPine  5 mg per OG tube Daily   . cefTRIAXone  2 g Intravenous Q24H SCH   . chlorhexidine  15 mL Mouth/Throat BID   . fluconazole  200 mg Intravenous Q24H SCH   . heparin (porcine)  80 Units/kg (Adjusted) Intravenous Once   . lactobacillus/streoptococcus  1 capsule Oral Daily   . linezolid  600 mg Oral Q12H SCH   . nystatin   Topical BID   . nystatin  1 application Topical BID   . pantoprazole  40 mg Intravenous Daily   . sodium chloride (PF)  10 mL Intracatheter Daily   . sucralfate  1 g Oral Q6H   . vancomycin  125 mg Oral Q6H SCH         Continuous Infusions:       . sodium chloride 65 mL/hr at 05/14/13 1140   . heparin 16109 units in dextrose 5% 500 mL 22 Units/kg/hr (05/15/13 0631)   . insulin (regular) infusion 1.5 Units/hr (05/15/13 1100)   . propofol Stopped (  05/14/13 1454)   . [DISCONTINUED] heparin 16109 units in dextrose 5% 500 mL Stopped (05/14/13 0548)   . [DISCONTINUED] heparin 60454 units in dextrose 5% 500 mL 18 Units/kg/hr (05/14/13 2304)          Physical Exam:     Filed Vitals:    05/15/13 1000   BP: 117/70   Pulse: 96   Temp: 96.8 F (36 C)   Resp: 22   SpO2: 99%         Intake/Output Summary (Last 24 hours) at 05/15/13 1126  Last data filed at 05/15/13 1000   Gross per 24 hour   Intake 2091.2 ml   Output   3530 ml   Net -1438.8 ml     General Appearance: obese, able to track, trached  Mental status: alert  Neuro: awake, diffusely weak,trached, he is making attempts of moving his legs but all he can move is wriggling his ankles. He responds and smiles at times.  Neck: supple   Lungs: bilateral rhonchi anteriorly, no wheeze  Cardiac: ns1, ns2,  no m/r/g, tachycardic   Abdomen: Obese, soft, nontender, hypoactive bowel sounds, peg site intact, no erythema  Extremities: no lower extremity edema   Skin:  has a triangular mark on right forearm area that is scabbed over, has abrasion on left forearm, has grease on his hands, has abrasions on his sacral area, has various bruising on his upper extremities.      Data:       Invasive ICU Hemodynamics:    Invasive Hemodynamic Monitoring  CVP (mmHg): 22 mmHg  CO (L/min): 10.4 L/min  CI (L/min/m2): 4.3 L/min/m2    Art Line  Arterial Line BP: 139/70 mmHg  Arterial Line MAP (mmHg): 89 mmHg  Arterial Line Location: Left radial  Art Line Wave Form: Appropriate wave forms      Vent Settings:    Vent Settings  Vent Mode: PRVC  FiO2: 40 %  Resp Rate (Set): 20   Vt (Set, mL): 500 mL  PIP Observed (cm H2O): 17 cm H2O  PEEP/EPAP: 5 cm H20  Pressure Support / IPAP: 10 cmH20  Mean Airway Pressure: 10 cmH20        Labs:       Labs (last 72 hours):  Recent Labs   Basename 05/15/13 0504 05/14/13 0312 05/13/13 0632    WBC 14.32* 16.22* 16.27*    HGB 9.4* 9.6* 9.5*    HCT 29.1* 29.6* 28.0*    LABPLAT -- -- --     Recent Labs   Basename 05/15/13 0504 05/14/13 0312 05/13/13 1841 05/12/13 2230    PT -- 12.5* -- 14.2    INR -- 1.0 -- 1.1    PTT 34 51* 48* --    Recent Labs   Basename 05/15/13 0504 05/14/13 0312 05/13/13 0719 05/13/13 0409    NA 139 141 140 --    K 3.4* 3.6 3.0* --    CL 107 109* 109* --    CO2 23 22 19* --    BUN 25.9* 31.9* 38.9* --    CREAT 0.8 0.8 1.0 --    GLU 178* 107* 188* --    CA 7.8* 8.1* 7.9* --    MG 1.6 1.8 -- 1.8    PHOS 3.1 3.8 -- 3.4                 Rads:   Radiological Imaging personally reviewed,and agree with radiology report including:  CXR  05/08/2013  Significantly improved infiltrates compared to 04/30/2013      I have personally reviewed the patient's history and 24 hour interval events, along with vitals, labs, radiology images and nursing.         Signed by: Lawernce Ion, MD  Date/Time: 05/15/2013  11:26 AM

## 2013-05-16 LAB — CBC AND DIFFERENTIAL
Hematocrit: 29.1 % — ABNORMAL LOW (ref 42.0–52.0)
Hgb: 9.3 g/dL — ABNORMAL LOW (ref 13.0–17.0)
MCH: 27.9 pg — ABNORMAL LOW (ref 28.0–32.0)
MCHC: 32 g/dL (ref 32.0–36.0)
MCV: 87.4 fL (ref 80.0–100.0)
MPV: 11 fL (ref 9.4–12.3)
Platelets: 271 10*3/uL (ref 140–400)
RBC: 3.33 10*6/uL — ABNORMAL LOW (ref 4.70–6.00)
RDW: 17 % — ABNORMAL HIGH (ref 12–15)
WBC: 13.73 10*3/uL — ABNORMAL HIGH (ref 3.50–10.80)

## 2013-05-16 LAB — GLUCOSE WHOLE BLOOD - POCT
Whole Blood Glucose POCT: 196 mg/dL — ABNORMAL HIGH (ref 70–100)
Whole Blood Glucose POCT: 207 mg/dL — ABNORMAL HIGH (ref 70–100)
Whole Blood Glucose POCT: 209 mg/dL — ABNORMAL HIGH (ref 70–100)
Whole Blood Glucose POCT: 212 mg/dL — ABNORMAL HIGH (ref 70–100)
Whole Blood Glucose POCT: 214 mg/dL — ABNORMAL HIGH (ref 70–100)

## 2013-05-16 LAB — MAN DIFF ONLY
Band Neutrophils Absolute: 0.96 10*3/uL (ref 0.00–1.00)
Band Neutrophils: 7 %
Basophils Absolute Manual: 0 10*3/uL (ref 0.00–0.20)
Basophils Manual: 0 %
Eosinophils Absolute Manual: 0.41 10*3/uL (ref 0.00–0.70)
Eosinophils Manual: 3 %
Lymphocytes Absolute Manual: 0.96 10*3/uL (ref 0.50–4.40)
Lymphocytes Manual: 7 %
Metamyelocytes Absolute: 0.14 10*3/uL — ABNORMAL HIGH
Metamyelocytes: 1 %
Monocytes Absolute: 0.96 10*3/uL (ref 0.00–1.20)
Monocytes Manual: 7 %
Neutrophils Absolute Manual: 10.3 10*3/uL — ABNORMAL HIGH (ref 1.80–8.10)
Nucleated RBC: 0 (ref 0–1)
Segmented Neutrophils: 75 %

## 2013-05-16 LAB — BASIC METABOLIC PANEL
Anion Gap: 8 (ref 5.0–15.0)
BUN: 23.8 mg/dL — ABNORMAL HIGH (ref 9.0–21.0)
CO2: 24 mEq/L (ref 22–29)
Calcium: 8 mg/dL — ABNORMAL LOW (ref 8.5–10.5)
Chloride: 105 mEq/L (ref 98–107)
Creatinine: 0.8 mg/dL (ref 0.7–1.3)
Glucose: 215 mg/dL — ABNORMAL HIGH (ref 70–100)
Potassium: 3.8 mEq/L (ref 3.5–5.1)
Sodium: 137 mEq/L (ref 136–145)

## 2013-05-16 LAB — CELL MORPHOLOGY
Cell Morphology: ABNORMAL — AB
Platelet Estimate: NORMAL

## 2013-05-16 LAB — APTT
PTT: 44 s — ABNORMAL HIGH (ref 23–37)
PTT: 75 s — ABNORMAL HIGH (ref 23–37)
PTT: 54 s — ABNORMAL HIGH (ref 23–37)

## 2013-05-16 LAB — MAGNESIUM: Magnesium: 1.6 mg/dL (ref 1.6–2.6)

## 2013-05-16 LAB — PHOSPHORUS: Phosphorus: 3.3 mg/dL (ref 2.3–4.7)

## 2013-05-16 LAB — GFR: EGFR: >60

## 2013-05-16 MED ORDER — MORPHINE SULFATE 10 MG/5ML PO SOLN
2.5000 mg | ORAL | Status: DC | PRN
Start: 2013-05-16 — End: 2013-05-23

## 2013-05-16 MED ORDER — ACETAMINOPHEN 160 MG/5ML PO SOLN
325.0000 mg | Freq: Four times a day (QID) | ORAL | Status: DC | PRN
Start: 2013-05-16 — End: 2013-05-19
  Administered 2013-05-16 – 2013-05-17 (×2): 325 mg via ORAL
  Filled 2013-05-16 (×2): qty 20.3

## 2013-05-16 MED ORDER — MAGNESIUM SULFATE IN D5W 10-5 MG/ML-% IV SOLN
1.0000 g | Freq: Once | INTRAVENOUS | Status: DC
Start: 2013-05-16 — End: 2013-05-16

## 2013-05-16 NOTE — OT Progress Note (Signed)
[facility]  [address]  [place], [place]. [address]    Department of Rehabilitation Services  [phone]    Occupational Therapy Treatment Note       Patient:  Adam Simmons MRN#:  [id number]  INTENSIVE CARE [place] IC05/IC05-A    Time of treatment: Start Time: 0950 Stop Time: 1035   Time Calculation (min): 45 min    OT Visit Number: 4    Precautions and Contraindications:  Fall risk       Assessment: Patient limiting neck movement to R side of room despite instruction. Patient demonstrating trace muscle movements through finger and hands. Minimal shoulder shrug. Patient attempted to keep head at midline when assisted to EOB.     Assessment  Assessment: decreased independence with ADLs;balance deficits;decreased strength;decreased ROM;decreased cognition  Prognosis: Patient remains in ICU/critical care  Progress: Improving as expected    Patient Goal  Patient Goal: wants to get stronger       Plan: Continue with Occupational therapy services in acute care to address decreased independence with ADL's and generalized weakness. Focus next therapy session on sitting EOB. Look at potential E-stim to increase NM Re-ed in B UE. .      OT Plan  Treatment Interventions: ADL retraining;Functional transfer training;Neuro muscular reeducation;Patient/Family training  Discharge Recommendation:  (LTACH)  OT Frequency Recommended: 1-2x/wk  OT - Next Visit Recommendation: 05/18/13        Discharge Recommendation:  (LTACH)           Subjective:   .    Patient's medical condition is appropriate for Occupational Therapy intervention at this time.  Patient is agreeable to participation in the therapy session. Family and/or guardian are agreeable to patient's participation in the therapy session.       Objective:  Observation of Patient/Vital Signs:  Patient is in bed with telemetry, peripheral IV, PICC line, mechanical ventilation via tracheostomy and indwelling urinary catheter, rectal tube in  place.    Cognition  Arousal/Alertness: Appropriate responses to stimuli  Attention Span: Appears intact  Orientation Level: Oriented to person;Oriented to place  Memory: Decreased recall of recent events  Following Commands: Follows one step commands with increased time;5 times  Insights: Fully aware of deficits (fearful of having more pain )    Functional Mobility  Rolling: Dependent  Supine to Sit Transfers:  (total assistance. )  Chair Transfers:  (dependent using sky lift )  Patient unable to assist B LE off EOB. Patient initiated bringing head off bed to assist with getting shoulders off surface to come up into a seated position at EOB. Patient unable to control trunk at all to be able to sit without total assistance. Returned to supine and used sky lift to get patient up OOB into chair. Patient unable to assist in rolling side to side to get sling under him for lift.     Self-care and Home Management  Grooming: maximal assistance;wash/dry face (total care. Attempted to get hand to face )  LE Dressing: maximal assistance (total assistance to don pressure relief boots ) unable to initiate movements to be able to engage with ADL's at this time     Educated the patient to role of occupational therapy, plan of care, goals of therapy and safety with mobility and ADLs.    Patient left without needs and call bell within reach.  RN notified of session outcome.    Lear Ng, OTR/L  Acute Care Rehabilitation Clinical Coordinator  Pager [phone]  Ext.  [phone]

## 2013-05-16 NOTE — Progress Notes (Signed)
CM tried to call the [facility] of [place] insurance and office will open at 10:00 am as per the recording.  CM will f/u as needed.

## 2013-05-16 NOTE — PT Progress Note (Signed)
[facility]  [address]  [place], [place]. [address]    Department of Rehabilitation  [phone]    Physical Therapy Daily Treatment Note    Patient: Adam Simmons    MRN#: [id number]   Unit: IC05    Time of Treatment: Start Time: 1508 Stop Time: 1548    Time Calculation: 40 min  PT Visit Number: 7  Patient's medical condition is appropriate for Physical Therapy intervention at this time.  Precautions: trach/peg    Assessment:  Patient continues to present with profound weakness and limited tolerance for activity.  Patient will benefit from in-patient rehab to maximize functional outcome.  Remaining deficits include:  Decreased LE and UE strength; Decreased endurance/activity tolerance;Impaired motor control;Decreased functional mobility  Prognosis: Patient remains in ICU/critical care   Progress: Slow progress, decreased activity tolerance and medical status limitations.     Plan:  Continue with Physical Therapy services to address functional mobility, endurance, and strength deficits.  Focus next session on Neuromuscular re-education; Bed mobility; Functional transfer training; and LE strengthening exercise   PT Frequency: 4-5x/wk      Discharge Recommendation: (LTAC for long term vent weaning)     Subjective:  Patient is agreeable to participation in the therapy session.  Nursing clears patient for therapy.  Spouse present.    Pain Assessment: No/denies pain     Objective:  Observation of patient vital signs performed by nursing staff, see flow chart for details.   Patient is seated in a bedside chair with dressings, telemetry monitor, O2 saturation monitor, peripheral IV, PICC line, mechanical ventilation via tracheostomy, PEG tube, indwelling urinary catheter, and rectal tube in place.     Functional Mobility  Rolling: Dependent  Positioning in Bed:  Dependent     Transfers  Chair to Bed: Dependent  Device Used for Functional Transfer: hoyer lift     Therapeutic Exercise   PROM and Deep  pressure UE/LE to try to increase body awareness.   Hip/knee flexion Bx5   Ankle PF B x 10 against manual resistance      Educated the patient and spouse to role of physical therapy, plan of care, goals of therapy and encouraged spouse to have pt perform any movement/there ex throughout the day to decrease effects of immobility and increase circulation.  Patient left without needs.

## 2013-05-16 NOTE — Progress Notes (Signed)
[facility], [employer].  Progress Note    Assessment:    -AKI due to ATN resolved, required HD for one treatment.  Serum creatinine very stable at 0.8 mg/dL .  -BP stable   -DM   -Anemia     Plan:  -Reduce IV fluid  -supportive care   -replete electrolytes as needed  -labs   -avoid nephrotoxins     Adam Drape, MD  Office - [phone]  ++++++++++++++++++++++++++++++++++++++++++++++++++++++++++++++  Subjective:    Awake, trached, appears very confused     Medications:  Scheduled Meds:  Current Facility-Administered Medications   Medication Dose Route Frequency   . albuterol-ipratropium  3 mL Nebulization Q4H SCH   . cefTRIAXone  2 g Intravenous Q24H SCH   . chlorhexidine  15 mL Mouth/Throat BID   . fluconazole  200 mg Intravenous Q24H SCH   . heparin (porcine)  80 Units/kg (Adjusted) Intravenous Once   . insulin aspart  1-12 Units Subcutaneous Q4H   . insulin glargine  20 Units Subcutaneous Daily   . lactobacillus/streoptococcus  1 capsule Oral Daily   . nystatin   Topical BID   . nystatin  1 application Topical BID   . pantoprazole  40 mg Intravenous Daily   . sodium chloride (PF)  10 mL Intracatheter Daily   . sucralfate  1 g Oral Q6H   . warfarin  5 mg Oral Daily at 1800   . [DISCONTINUED] amLODIPine  5 mg per OG tube Daily   . [DISCONTINUED] insulin glargine  10 Units Subcutaneous Daily   . [DISCONTINUED] linezolid  600 mg Oral Q12H SCH   . [DISCONTINUED] vancomycin  125 mg Oral Q6H SCH     Continuous Infusions:       . sodium chloride 65 mL/hr at 05/16/13 0800   . heparin 09811 units in dextrose 5% 500 mL 26 Units/kg/hr (05/16/13 0800)   . [DISCONTINUED] insulin (regular) infusion Stopped (05/15/13 1400)   . [DISCONTINUED] propofol Stopped (05/14/13 1454)     PRN Meds:acetaminophen, albuterol, dextrose, dextrose, glucagon (rDNA), heparin (porcine), heparin (porcine), HYDROmorphone, magnesium sulfate, midazolam, morphine, ondansetron, potassium chloride, sodium phosphate IVPB, sodium  phosphate IVPB, sodium phosphate IVPB, zinc Oxide, [DISCONTINUED] dextrose, [DISCONTINUED] dextrose, [DISCONTINUED] dextrose, [DISCONTINUED] glucagon (rDNA), [DISCONTINUED] meperidine, [DISCONTINUED] metoclopramide  [DISCONTINUED] morphine, [DISCONTINUED] promethazine    Objective:  Vital signs in last 24 hours:  Temp:  [96.8 F (36 C)-97.7 F (36.5 C)] 97.7 F (36.5 C)  Heart Rate:  [95-110] 102   Resp Rate:  [20-27] 25   BP: (114-141)/(61-78) 130/71 mmHg  FiO2:  [40 %] 40 %  Intake/Output last 24 hours:    Intake/Output Summary (Last 24 hours) at 05/16/13 0916  Last data filed at 05/16/13 0800   Gross per 24 hour   Intake   4439 ml   Output   2550 ml   Net   1889 ml     Intake/Output this shift:  I/O this shift:  In: 100 [NG/GT:100]  Out: -     Physical Exam:   Gen: trached    CV: S1 S2 N   Chest: CTAB   Ab: ND NT    Ext: No C/E    Labs:    Lab 05/16/13 0418 05/15/13 0504 05/14/13 0312   GLU 215* 178* 107*   BUN 23.8* 25.9* 31.9*   CREAT 0.8 0.8 0.8   CA 8.0* 7.8* 8.1*   NA 137 139 141   K 3.8 3.4* 3.6  CL 105 107 109*   CO2 24 23 22    ALB -- 2.0* 1.9*   PHOS 3.3 3.1 3.8   MG 1.6 1.6 1.8       Lab 05/16/13 0418 05/15/13 0504 05/14/13 0312   WBC 13.73* 14.32* 16.22*   HGB 9.3* 9.4* 9.6*   HCT 29.1* 29.1* 29.6*   MCV 87.4 87.4 85.8   MCH 27.9* 28.2 27.8*   MCHC 32.0 32.3 32.4   RDW 17* 17* 16*   MPV 11.0 10.8 11.1   PLT 271 225 242

## 2013-05-16 NOTE — Progress Notes (Signed)
Infectious Disease            Progress Note    05/16/2013   Adam Simmons [id number] is a 48 y.o. male, history of hypertension, diabetes mellitus, kidney stones. Admitted with septic shock, pneumonia, respiratory failure.    Subjective:     Adam Simmons today Symptoms: Afebrile, more responsive, status post tracheostomy and G-tube placement, improving leukocytosis    Objective:     Blood pressure 126/74, pulse 101, temperature 97.7 F (36.5 C), temperature source Temporal Artery, resp. rate 22, height 1.727 m (5\' 8" ), weight 115 kg (253 lb 8.5 oz), SpO2 100.00%.    General Appearance: Awake, responsive , much more comfortable today  HEENT: Pallor negative, Anicteric sclera.  status post tracheostomy  Lungs:  Bilateral decreased breath sounds  Heart: Normal rate.  S1 and S2.    Chest Wall: Symmetric chest wall expansion.   Abdomen: Abdomen is soft, scaphoid and non-distended. There are no signs of ascites. Bowel sounds are normal.G-tube in place in place  Neurological: Patient is awake, responsive    Laboratory And Diagnostic Studies:     Recent Labs   [facility] 05/16/13 0418 05/15/13 0504    WBC 13.73* 14.32*    HGB 9.3* 9.4*    HCT 29.1* 29.1*    PLT 271 225    NEUTRO 75 75     Recent Labs   Basename 05/16/13 0418 05/15/13 0504    NA 137 139    K 3.8 3.4*    CL 105 107    CO2 24 23    BUN 23.8* 25.9*    CREAT 0.8 0.8    GLU 215* 178*    CA 8.0* 7.8*       Blood cultures:  Coagulase-negative staph  Sputum cultures: Candida species      Current Med's:     Current Facility-Administered Medications   Medication Dose Route Frequency   . albuterol-ipratropium  3 mL Nebulization Q4H SCH   . cefTRIAXone  2 g Intravenous Q24H SCH   . chlorhexidine  15 mL Mouth/Throat BID   . fluconazole  200 mg Intravenous Q24H SCH   . heparin (porcine)  80 Units/kg (Adjusted) Intravenous Once   . insulin aspart  1-12 Units Subcutaneous Q4H   . insulin glargine  20 Units Subcutaneous Daily   .  lactobacillus/streoptococcus  1 capsule Oral Daily   . nystatin   Topical BID   . nystatin  1 application Topical BID   . pantoprazole  40 mg Intravenous Daily   . sodium chloride (PF)  10 mL Intracatheter Daily   . sucralfate  1 g Oral Q6H   . warfarin  5 mg Oral Daily at 1800   . [DISCONTINUED] amLODIPine  5 mg per OG tube Daily   . [DISCONTINUED] insulin glargine  10 Units Subcutaneous Daily   . [DISCONTINUED] linezolid  600 mg Oral Q12H SCH   . [DISCONTINUED] vancomycin  125 mg Oral Q6H SCH             Assessment:      Condition.  Guarded   Septic shock; resolved   Strep pneumoniae sepsis   Coagulase-negative staph bacteremia     Respiratory failure, status post tracheostomy   Bilateral pneumonia   Possible aspiration   Diabetes mellitus   Acute kidney injury; resolved   Diarrhea; C. Difficile negative     Plan:      Continue Rocephin   Continue  Diflucan   Continue supportive care  Correction of electrolytes   Nephrology follow up   Discussed with wife   Physical therapy        Alfonzo Beers, M.D.,FACP  05/16/2013  8:43 AM

## 2013-05-16 NOTE — Progress Notes (Addendum)
[facility]- Critical Care Note     ICU Daily Progress Note        Date Time: 05/16/2013 9:55 PM  Patient Name: Adam Simmons  Attending Physician: Lawernce Ion, MD  Room: IC05/IC05-A   Admit Date: 04/24/2013  LOS: 22 days            Assessment:     Patient Active Problem List   Diagnosis   . Respiratory failure   . Pneumonia   . Hypokalemia   . ARDS (adult respiratory distress syndrome)   . DM (diabetes mellitus)   . AKI (acute kidney injury)   . Pneumococcal sepsis   s/p tracheostomy / peg  Vent day 22  OOB to chai  Undergoing 2 periods of PS vent trials, 4 hrs, tolerated well  Pt/ot  Hypomagnesemia repleted.  R arm DVT on coumadin    Plan:   Rehab placement, weaning process.        Subjective:   Awake, follows simpole commands    Medications:       Scheduled Meds: PRN Meds:         albuterol-ipratropium 3 mL Nebulization Q4H SCH   cefTRIAXone 2 g Intravenous Q24H SCH   chlorhexidine 15 mL Mouth/Throat BID   fluconazole 200 mg Intravenous Q24H SCH   heparin (porcine) 80 Units/kg (Adjusted) Intravenous Once   insulin aspart 1-12 Units Subcutaneous Q4H   insulin glargine 20 Units Subcutaneous Daily   lactobacillus/streoptococcus 1 capsule Oral Daily   nystatin  Topical BID   nystatin 1 application Topical BID   pantoprazole 40 mg Intravenous Daily   sodium chloride (PF) 10 mL Intracatheter Daily   sucralfate 1 g Oral Q6H   warfarin 5 mg Oral Daily at 1800       Continuous Infusions:       . sodium chloride 35 mL/hr at 05/16/13 1822   . heparin 16109 units in dextrose 5% 500 mL 26 Units/kg/hr (05/16/13 1346)         acetaminophen 325 mg Q6H PRN   acetaminophen 500 mg Q4H PRN   albuterol 2.5 mg Q1H PRN   dextrose 15 g PRN   dextrose 25 mL PRN   glucagon (rDNA) 1 mg PRN   heparin (porcine) 40 Units/kg (Adjusted) PRN   heparin (porcine) 80 Units/kg (Adjusted) PRN   magnesium sulfate 1 g PRN   midazolam 2 mg Q1H PRN   morphine 2.5 mg Q2H PRN   morphine 2 mg Q2H PRN   ondansetron 4 mg Q8H PRN   potassium chloride  20 mEq PRN   sodium phosphate IVPB 15 mmol PRN   sodium phosphate IVPB 25 mmol PRN   sodium phosphate IVPB 35 mmol PRN   zinc Oxide  PRN   [DISCONTINUED] HYDROmorphone 0.5 mg Q5 Min PRN             Physical Exam:     Filed Vitals:    05/16/13 1900 05/16/13 1933 05/16/13 2000 05/16/13 2100   BP: 126/71  118/65 121/69   Pulse: 93  90 90   Temp: 97 F (36.1 C)      TempSrc: Temporal Artery      Resp: 21  21 25    Height:       Weight:       SpO2: 100% 100% 100% 100%     Temp (24hrs), Avg:97 F (36.1 C), Min:96.7 F (35.9 C), Max:97.7 F (36.5 C)  02/24 0701 - 02/25 0700  In: 4339 [I.V.:2519]  Out: 2550 [Urine:2550]       General Appearance: on vent  Mental status: responsive, alerts  Neuro:  Moves hands, feet, 2-3/5 strength  H & N: no lesions, trach site clean  Lungs: few basilar rales  Cardiac: reg  Abdomen:  Soft, peg in place  Extremities: trace edema  Skin: warm      Data:       Vent Settings:    Vent Settings  Vent Mode: PRVC  FiO2: 40 %  Resp Rate (Set): 20   Vt (Set, mL): 500 mL  PIP Observed (cm H2O): 22 cm H2O  PEEP/EPAP: 5 cm H20  Pressure Support / IPAP: 10 cmH20  Mean Airway Pressure: 11 cmH20      Labs:     Recent CBC   Lab 05/16/13 0418 05/15/13 0504 05/14/13 0312   WBC 13.73* 14.32* 16.22*   RBC 3.33* 3.33* 3.45*   HGB 9.3* 9.4* 9.6*   HCT 29.1* 29.1* 29.6*   MCV 87.4 87.4 85.8   PLT 271 225 242         Lab 05/16/13 1055 05/16/13 0418 05/15/13 1602 05/15/13 0504 05/14/13 0312 05/12/13 2230   NA -- 137 -- 139 141 --   K -- 3.8 -- 3.4* 3.6 --   CL -- 105 -- 107 109* --   CO2 -- 24 -- 23 22 --   GLU -- 215* -- 178* 107* --   BUN -- 23.8* -- 25.9* 31.9* --   CREAT -- 0.8 -- 0.8 0.8 --   MG -- 1.6 -- 1.6 1.8 --   PHOS -- 3.3 -- 3.1 3.8 --   AST -- -- -- 20 19 --   ALT -- -- -- 30 33 --   ALKPHOS -- -- -- 86 81 --   BILITOTAL -- -- -- 0.5 0.3 --   BILIDIRECT -- -- -- -- -- --   LIP -- -- -- -- -- --   BNP -- -- -- -- -- --   PT -- -- -- -- 12.5* 14.2   INR -- -- -- -- 1.0 1.1   PTT 75* 44* 71* --  -- --   DDIMER -- -- -- -- -- --   CK -- -- -- -- -- --   CKMB -- -- -- -- -- --   TROPI -- -- -- -- -- --   MYOGLOBIN -- -- -- -- -- --           Rads:     Radiology Results (24 Hour)     ** No Results found for the last 24 hours. **          Radiological Imaging personally reviewed.    I have personally reviewed the patient's history and 24 hour interval events, along with vitals, labs, radiology images and  ventilator settings and additional findings found in detail within ICU team notes, with their care plans developed with and reviewed by me.         Signed by: Durward Fortes, MD  Date/Time: 05/16/2013 9:56 PM

## 2013-05-16 NOTE — Progress Notes (Signed)
Case Manager called the [employer] [place] again and talked to Skip Mayer., the nurse, the Authorization apartment at [phone] to get authorization for the [facility] facility in [place].  Pending reference number for LTACH is [id number].  CM faxed clinical information to Skip Mayer at fax number [phone]

## 2013-05-16 NOTE — Progress Notes (Addendum)
Pt remains on Heparin gtt moderate nomogram, ptt=75 at 1100, pt remains on 26 units/kg/hr, next ptt 05/16/13 at 2300. Antionette Fairy Turner-Swartz

## 2013-05-16 NOTE — Plan of Care (Signed)
Problem: Pain  Goal: Patient's pain/discomfort is manageable  Outcome: Progressing  Pt given Tylenol via Peg tube for generalized arthritis "joint pain" to left ankle, left shoulder, and right knee.  Dr Lesle Reek ordered liquid morphine for pain for HS dose.     Problem: Impaired Mobility  Goal: Mobility/activity is maintained at optimum level for patient  Outcome: Progressing  Pt assisted with 2 person assist with portable sling lift to recliner chair from 1100-1500, pt repositioned frequently in chair as pt has profound weakness unable to lift arms or legs off bed, pt able to very weakly grip and wiggle toes and fingers. Pt worked with PT/OT today.      Problem: Mechanical Ventilation  Goal: Patent Airway maintained (Mechanical Ventiliation)  Outcome: Progressing  Pt tolerated 4 hrs PS today while OOB to recliner chair with portable sling lift.  Pt suctioned pale pink-yellow sputum via #8 shiley trach, large amt blood-tinged drainage around site sutured intact.     Comments:   Antionette Fairy [facility]

## 2013-05-17 LAB — APTT
PTT: 71 s — ABNORMAL HIGH (ref 23–37)
PTT: 63 s — ABNORMAL HIGH (ref 23–37)

## 2013-05-17 LAB — BASIC METABOLIC PANEL
Anion Gap: 9 (ref 5.0–15.0)
BUN: 18.7 mg/dL (ref 9.0–21.0)
CO2: 25 mEq/L (ref 22–29)
Calcium: 8.1 mg/dL — ABNORMAL LOW (ref 8.5–10.5)
Chloride: 103 mEq/L (ref 98–107)
Creatinine: 0.8 mg/dL (ref 0.7–1.3)
Glucose: 299 mg/dL — ABNORMAL HIGH (ref 70–100)
Potassium: 3.8 mEq/L (ref 3.5–5.1)
Sodium: 137 mEq/L (ref 136–145)

## 2013-05-17 LAB — PT/INR
PT INR: 1.2
PT: 14.8 s (ref 12.6–15.0)

## 2013-05-17 LAB — GLUCOSE WHOLE BLOOD - POCT
Whole Blood Glucose POCT: 230 mg/dL — ABNORMAL HIGH (ref 70–100)
Whole Blood Glucose POCT: 275 mg/dL — ABNORMAL HIGH (ref 70–100)
Whole Blood Glucose POCT: 399 mg/dL — ABNORMAL HIGH (ref 70–100)
Whole Blood Glucose POCT: 158 mg/dL — ABNORMAL HIGH (ref 70–100)
Whole Blood Glucose POCT: 162 mg/dL — ABNORMAL HIGH (ref 70–100)

## 2013-05-17 LAB — CBC
Hematocrit: 28.8 % — ABNORMAL LOW (ref 42.0–52.0)
Hgb: 9.3 g/dL — ABNORMAL LOW (ref 13.0–17.0)
MCH: 27.8 pg — ABNORMAL LOW (ref 28.0–32.0)
MCHC: 32.3 g/dL (ref 32.0–36.0)
MCV: 86.2 fL (ref 80.0–100.0)
MPV: 10.9 fL (ref 9.4–12.3)
Platelets: 316 10*3/uL (ref 140–400)
RBC: 3.34 10*6/uL — ABNORMAL LOW (ref 4.70–6.00)
RDW: 17 % — ABNORMAL HIGH (ref 12–15)
WBC: 13.38 10*3/uL — ABNORMAL HIGH (ref 3.50–10.80)

## 2013-05-17 LAB — GFR: EGFR: >60

## 2013-05-17 LAB — MAGNESIUM: Magnesium: 1.7 mg/dL (ref 1.6–2.6)

## 2013-05-17 MED ORDER — INSULIN ASPART 100 UNIT/ML SC SOLN
15.0000 [IU] | Freq: Once | SUBCUTANEOUS | Status: AC
Start: 2013-05-17 — End: 2013-05-17
  Administered 2013-05-17: 15 [IU] via SUBCUTANEOUS
  Filled 2013-05-17: qty 150

## 2013-05-17 MED ORDER — WARFARIN SODIUM 5 MG PO TABS
7.5000 mg | ORAL_TABLET | Freq: Every day | ORAL | Status: DC
Start: 2013-05-17 — End: 2013-05-21
  Administered 2013-05-17 – 2013-05-20 (×4): 7.5 mg via ORAL
  Filled 2013-05-17 (×4): qty 2

## 2013-05-17 MED FILL — Piperacillin Sod-Tazobactam Sod For Inj 4.5 GM (4-0.5 GM): INTRAVENOUS | Qty: 100 | Status: AC

## 2013-05-17 NOTE — Progress Notes (Signed)
Infectious Disease            Progress Note    05/17/2013   Adam Simmons [id number] is a 48 y.o. male, history of hypertension, diabetes mellitus, kidney stones. Admitted with septic shock, pneumonia, respiratory failure.    Subjective:     Adam Simmons today Symptoms: Afebrile, improving leukocytosis. Sitting in chair. No new complaints, still some diarrhea.    Objective:     Blood pressure 125/69, pulse 97, temperature 96.7 F (35.9 C), temperature source Temporal Artery, resp. rate 20, height 1.727 m (5\' 8" ), weight 115 kg (253 lb 8.5 oz), SpO2 100.00%.    General Appearance: Awake, responsive , much more comfortable today  HEENT: Pallor negative, Anicteric sclera.  status post tracheostomy  Lungs:  Bilateral decreased breath sounds  Heart: Normal rate.  S1 and S2.    Chest Wall: Symmetric chest wall expansion.   Abdomen: Abdomen is soft, scaphoid and non-distended. There are no signs of ascites. Bowel sounds are normal.G-tube in place   Neurological: Patient is awake, responsive    Laboratory And Diagnostic Studies:     Recent Labs   [facility] 05/17/13 0629 05/16/13 0418 05/15/13 0504    WBC 13.38* 13.73* --    HGB 9.3* 9.3* --    HCT 28.8* 29.1* --    PLT 316 271 --    NEUTRO -- 75 75     Recent Labs   Basename 05/17/13 0629 05/16/13 0418    NA 137 137    K 3.8 3.8    CL 103 105    CO2 25 24    BUN 18.7 23.8*    CREAT 0.8 0.8    GLU 299* 215*    CA 8.1* 8.0*         Current Med's:     Current Facility-Administered Medications   Medication Dose Route Frequency   . albuterol-ipratropium  3 mL Nebulization Q4H SCH   . cefTRIAXone  2 g Intravenous Q24H SCH   . chlorhexidine  15 mL Mouth/Throat BID   . fluconazole  200 mg Intravenous Q24H SCH   . heparin (porcine)  80 Units/kg (Adjusted) Intravenous Once   . insulin aspart  1-12 Units Subcutaneous Q4H   . insulin glargine  20 Units Subcutaneous Daily   . lactobacillus/streoptococcus  1 capsule Oral Daily   . nystatin   Topical BID   .  nystatin  1 application Topical BID   . pantoprazole  40 mg Intravenous Daily   . sodium chloride (PF)  10 mL Intracatheter Daily   . sucralfate  1 g Oral Q6H   . warfarin  5 mg Oral Daily at 1800   . [DISCONTINUED] magnesium sulfate  1 g Intravenous Once             Assessment:      Condition.  Guarded   Septic shock; resolved   Strep pneumoniae sepsis   Coagulase-negative staph bacteremia     Respiratory failure, status post tracheostomy   Bilateral pneumonia   Possible aspiration   Diabetes mellitus   Acute kidney injury; resolved   Diarrhea; C. Difficile negative     Plan:      Continue Rocephin   Continue  Diflucan   Continue supportive care   Correction of electrolytes   Nephrology follow up   Physical therapy   Discussed with patient's wife        Alfonzo Beers, M.D.,FACP  05/17/2013  9:13 AM

## 2013-05-17 NOTE — Progress Notes (Addendum)
[facility]- Critical Care Note     ICU Daily Progress Note        Date Time: 05/17/2013 11:04 AM  Patient Name: Adam Simmons  Attending Physician: Lawernce Ion, MD  Room: IC05/IC05-A   Admit Date: 04/24/2013  LOS: 23 days        Assessment/Plan:   # Neurologic: alert. Critical care myopathy. Diffuse generalized weakness.     # Respiratory: multilobar Strep Pneumonia pneumonia, resolved ARDS.  Still tachypnic on ventilator. Cont chest physiotherapy and mucomyst. S/p trach placement. Awaiting placement. Pt will benefit from vent weaning facility [facility])    # Nutrition: appreciate nutritionist recommendation, cont tube feeds via peg    # Endocrine: Diabetes;  Cont lantus and SSI    # Cardiac: Echo indicated EF 50%    #renal: acute kidney injury stable, appreciate Dr. Dalbert Batman group recs. Cont free water bolus q4hrs.     # Infectious diseases: streptococcus pneumonia bacteremia/ pneumonia.  Appreciate Dr. Myrtis Ser consultation and evaluation. Continue antibiotics    #Right arm DVT: cont heparin gtt. Cont coumadin. Check daily inr.     Code Status: full code     I have personally reviewed the patient's history and 24 hour interval events, along with vitals, labs, radiology images, and nurses report.      discussed with patient's wife at the bedside and answered her questions.        Subjective:     48 y.o. male h/o DM type 2, HTN who presents to the hospital with respiratory failure, from streptococcus pneumonia, ARDS, AKI and septic shock.      patient alert, able to move his head a lot more.  Doing well sitting a chair, doing well on pressure support trial.     Hospital Course  2/3   ARDS / PNA / Sepsis  w/ resp failure- intubated,  Flu test-neg  Resp c+s= gm + cocci , x2BC= gm + cocci resembling strep  2/6 [place] placed for worsening creatinine, 14 L positive  2/8 dialysis  2/10 failed SBT  2/11 tachypnic, readjusted ventilator tidal volume.   2/13 hemodialysis catheter removed, bronchoscopy with  improved pulmonary toilet  2/15 pulmonary toilet, mucomyst, chest PT vest  2/17 fever 102F, cooling blanket  2/23 trach shiley #8, cuffed, peg   2/24 pressure support trial 10/5 x 2hrs tolerated  2/26 PS trial 10/5 x 4 hrs bid    Medications:   Scheduled Meds:  Current Facility-Administered Medications   Medication Dose Route Frequency   . albuterol-ipratropium  3 mL Nebulization Q4H SCH   . cefTRIAXone  2 g Intravenous Q24H SCH   . chlorhexidine  15 mL Mouth/Throat BID   . fluconazole  200 mg Intravenous Q24H SCH   . heparin (porcine)  80 Units/kg (Adjusted) Intravenous Once   . insulin aspart  1-12 Units Subcutaneous Q4H   . insulin glargine  20 Units Subcutaneous Daily   . lactobacillus/streoptococcus  1 capsule Oral Daily   . nystatin   Topical BID   . nystatin  1 application Topical BID   . pantoprazole  40 mg Intravenous Daily   . sodium chloride (PF)  10 mL Intracatheter Daily   . sucralfate  1 g Oral Q6H   . warfarin  5 mg Oral Daily at 1800   . [DISCONTINUED] magnesium sulfate  1 g Intravenous Once         Continuous Infusions:       . sodium chloride 35 mL/hr at  05/17/13 0800   . heparin 36644 units in dextrose 5% 500 mL 28 Units/kg/hr (05/17/13 0800)          Physical Exam:     Filed Vitals:    05/17/13 1000   BP: 130/75   Pulse: 99   Temp:    Resp: 20   SpO2: 100%         Intake/Output Summary (Last 24 hours) at 05/17/13 1104  Last data filed at 05/17/13 0600   Gross per 24 hour   Intake   3765 ml   Output   3330 ml   Net    435 ml     General Appearance: obese, able to track, trached  Mental status: alert  Neuro: awake, diffusely weak,trached, he is making attempts of moving his legs but all he can move is wriggling his ankles. He responds and smiles at times.  Neck: supple   Lungs: bilateral rhonchi anteriorly, no wheeze  Cardiac: ns1, ns2, no m/r/g, tachycardic   Abdomen: Obese, soft, nontender, hypoactive bowel sounds, peg site intact, no erythema  Extremities: no lower extremity edema   Skin:  has a  triangular mark on right forearm area that is scabbed over, has abrasion on left forearm, has grease on his hands, has abrasions on his sacral area, has various bruising on his upper extremities.      Data:     Vent Settings:    Vent Settings  Vent Mode: PS/CPAP  FiO2: 40 %  Resp Rate (Set): 20   Vt (Set, mL): 500 mL  PIP Observed (cm H2O): 15 cm H2O  PEEP/EPAP: 5 cm H20  Pressure Support / IPAP: 10 cmH20  Mean Airway Pressure: 8 cmH20        Labs:       Labs (last 72 hours):  Recent Labs   Basename 05/17/13 0629 05/16/13 0418 05/15/13 0504    WBC 13.38* 13.73* 14.32*    HGB 9.3* 9.3* 9.4*    HCT 28.8* 29.1* 29.1*    LABPLAT -- -- --     Recent Labs   Basename 05/17/13 0629 05/16/13 2249 05/16/13 1055    PT -- -- --    INR -- -- --    PTT 71* 54* 75*    Recent Labs   Basename 05/17/13 0629 05/16/13 0418 05/15/13 0504    NA 137 137 139    K 3.8 3.8 3.4*    CL 103 105 107    CO2 25 24 23     BUN 18.7 23.8* 25.9*    CREAT 0.8 0.8 0.8    GLU 299* 215* 178*    CA 8.1* 8.0* 7.8*    MG 1.7 1.6 1.6    PHOS -- 3.3 3.1                 Rads:   Radiological Imaging personally reviewed,and agree with radiology report including:       CXR  05/08/2013  Significantly improved infiltrates compared to 04/30/2013      I have personally reviewed the patient's history and 24 hour interval events, along with vitals, labs, radiology images and nursing.         Signed by: Lawernce Ion, MD  Date/Time: 05/17/2013 11:04 AM

## 2013-05-17 NOTE — Progress Notes (Signed)
Case Manager sent referral to the [facility] in [place], [place]     CM also sent referral to the Skilled nursing facility, the [facility] in [place], [place].  Adam Simmons, the nurse liaison [phone]) called and told the CM that they do accept patient with Vent and Trach weaning.  She will review the clinical information and let the CM know in their facility is in the network of [employer]  ([place]).      This information was discussed with the patient as well as his wife, Adam Simmons, in the room and CM will f/u as needed.

## 2013-05-17 NOTE — Progress Notes (Signed)
Trach and PEG sites look good. Progressing on ventilatory wean. Tolerating tube feeds.    No surgical issues at this time

## 2013-05-17 NOTE — Progress Notes (Signed)
Nutritional Support Services  Nutrition Follow Up    Wessley Emert 48 y.o. male   MRN: [id number]    Nutrition Summary/Diet history:  Pt is vented and sedated.  s/p PEG/trach.  Propofol stopped on 2/23.    Nutrition Diagnosis:   Inadequate oral intake related to respiratory failure as evidenced by vent support.    Intervention:  1. Continue with current feeds.     Goal: Meet estimated needs with enteral nutrition support while vented.    Monitoring:   Evaluation:   1. Residuals < 500 mL < 5 mL  2. Weights   - 6.9 kg in the last 2 weeks  3. Net I&O   + 7.7 L since admission    Nutrition risk level: Moderate    Assessment Data:  Adm dx:  Respiratory failure   Patient Active Problem List   Diagnosis   . Respiratory failure   . Pneumonia   . Hypokalemia   . ARDS (adult respiratory distress syndrome)   . DM (diabetes mellitus)   . AKI (acute kidney injury)   . Pneumococcal sepsis   PMH:  has a past medical history of Hypertension; Diabetes mellitus; and Kidney stones.  PSH:  has past surgical history that includes Kidney stone surgery; TRACHEOSTOMY (05/14/2013); and INSERTION, PEG (05/14/2013).  Pertinent labs:  Lab 05/17/13 0629 05/16/13 0418 05/15/13 0504 05/14/13 0312 05/13/13 0719 05/13/13 0409 05/12/13 0409   NA 137 137 139 141 140 -- --   K 3.8 3.8 3.4* 3.6 3.0* -- --   CL 103 105 107 109* 109* -- --   CO2 25 24 23 22  19* -- --   BUN 18.7 23.8* 25.9* 31.9* 38.9* -- --   CREAT 0.8 0.8 0.8 0.8 1.0 -- --   GLU 299* 215* 178* 107* 188* -- --   CA 8.1* 8.0* 7.8* 8.1* 7.9* -- --   MG 1.7 1.6 1.6 1.8 -- 1.8 --   PHOS -- 3.3 3.1 3.8 -- 3.4 4.2   EGFR >60.0 >60.0 >60.0 >60.0 >60.0 -- --   Blood glucose range (48 hrs) 156-299 mg/dL  Pertinent meds: chloprhexidine, lactobacillus/streoptococcus, pantoprazole, sucralfate, insulin glargine, warfarin  Social history:  married  Food Intolerance/Religious/Ethnic preferences: none noted  Diet Order:  Orders Placed This Encounter   Procedures   . Diet NPO time specified   . Prosource no  carb Frequency:: TID with meals; Quantity:: A. One   . Tube feeding-CONTINUOUS   Enteral nutrition:   Indication: vented  Route: PEG  Frequency: continuous  Formula: Glucerna 1.0    Rate: 75 mL/hr   Formula provides: 1800 kcals, 75 g pro and 1535 mL free water  Additives: ProSource NoCarb 1 packet tid = 180 kcals and 45 g pro/d. (TF+ ProSource = 1980 kcals, 120 g pro and 1535 mL free water)   Water flushes: 150 mL q 4 hrs  Residuals: < 5 mL   GI symptoms: diarrhea  Hydration: +2, 1/2 NS @ 35 mL/hr  Skin: Stage II sacrum, II buttock, abrasions  Anthropometrics  Height: 172.7 cm (5\' 8" )  Weight: 115 kg (253 lb 8.5 oz)  Weight Change: -2.29   IBW/kg (Calculated) Male: 70.02 kg  IBW/kg (Calculated) Male: 63.62 kg  BMI (calculated): 42.6   Wt Readings from Last 30 Encounters:   05/12/13 115 kg (253 lb 8.5 oz)   05/12/13 115 kg (253 lb 8.5 oz)   05/12/13 115 kg (253 lb 8.5 oz)   05/12/13 115 kg (253 lb 8.5 oz)  Learning Needs: no  Estimated Needs:  Estimated Energy Needs  Total Energy Estimated Needs: 1890-2100 kcals/d  Method for Estimating Needs: 27-30 kcals/kg IBW  Estimated Protein Needs  Total Protein Estimated Needs: 91-105 g pro/d  Method for Estimating Needs: 1.3-1.5 g pro/kg IBW  Fluid Needs  Method for Estimating Needs: per MD  No Known Allergies    Geryl Councilman, RD, CNSC, CSO  Spectralink - [phone]

## 2013-05-17 NOTE — Progress Notes (Signed)
Case Manager called the customer service of [facility] [place] at  [phone] and talked to Royetta Crochet.  She told the CM that [facility] in [place], [place] [facility] in [place], facilities are in the [facility] hospital in [place] have 3 facilities and they all out of net work.  [facility], [facility], both, facilities in [place] and [address] area, are out of network.      As per Misty Stanley with [employer] this Patient will be responsible for $1400/- deductibles out of pocket to go to the out of net work facility and may be any other care, if it is not covered as per patient's insurance policy.      For ambulance Transportation we do not need authorization and there is no restriction for mileage for transport.      This information was given to patient and his wife.  Patient want to know if there is any network facility in the [place] area.  CM will find out if there is any LTACH facility and then send a referral to that facility.  CM will f/u as needed.

## 2013-05-17 NOTE — Plan of Care (Signed)
Problem: Mechanical Ventilation  Goal: Tracheostomy will be maintained  Outcome: Progressing  Pt tolerated PS for 4 hrs and then Trach collar today.  Pt placed back on previous vent settings per Dr Nedra Hai.  Pt tolerated both well.  Pt suctioned moderate amt tan yellow via trach, pt with strong productive cough with trach collar.          Comments:   Pt's PTT=63, pt given 3500 unit bolus per nomogram and heparin gtt increased to 30 units/kg/hr, next ptt at 0100.  Nurse report given to [employer]. Antionette Fairy Turner-Swartz

## 2013-05-18 LAB — CBC
Hematocrit: 28.7 % — ABNORMAL LOW (ref 42.0–52.0)
Hgb: 9.3 g/dL — ABNORMAL LOW (ref 13.0–17.0)
MCH: 27.7 pg — ABNORMAL LOW (ref 28.0–32.0)
MCHC: 32.4 g/dL (ref 32.0–36.0)
MCV: 85.4 fL (ref 80.0–100.0)
MPV: 10.3 fL (ref 9.4–12.3)
Platelets: 318 10*3/uL (ref 140–400)
RBC: 3.36 10*6/uL — ABNORMAL LOW (ref 4.70–6.00)
RDW: 17 % — ABNORMAL HIGH (ref 12–15)
WBC: 12.89 10*3/uL — ABNORMAL HIGH (ref 3.50–10.80)

## 2013-05-18 LAB — APTT
PTT: 107 s (ref 23–37)
PTT: 92 s (ref 23–37)
PTT: 77 s — ABNORMAL HIGH (ref 23–37)

## 2013-05-18 LAB — BASIC METABOLIC PANEL
Anion Gap: 9 (ref 5.0–15.0)
BUN: 18.1 mg/dL (ref 9.0–21.0)
CO2: 26 mEq/L (ref 22–29)
Calcium: 8.6 mg/dL (ref 8.5–10.5)
Chloride: 101 mEq/L (ref 98–107)
Creatinine: 0.7 mg/dL (ref 0.7–1.3)
Glucose: 208 mg/dL — ABNORMAL HIGH (ref 70–100)
Potassium: 3.8 mEq/L (ref 3.5–5.1)
Sodium: 136 mEq/L (ref 136–145)

## 2013-05-18 LAB — GLUCOSE WHOLE BLOOD - POCT
Whole Blood Glucose POCT: 163 mg/dL — ABNORMAL HIGH (ref 70–100)
Whole Blood Glucose POCT: 171 mg/dL — ABNORMAL HIGH (ref 70–100)
Whole Blood Glucose POCT: 173 mg/dL — ABNORMAL HIGH (ref 70–100)
Whole Blood Glucose POCT: 191 mg/dL — ABNORMAL HIGH (ref 70–100)
Whole Blood Glucose POCT: 193 mg/dL — ABNORMAL HIGH (ref 70–100)
Whole Blood Glucose POCT: 194 mg/dL — ABNORMAL HIGH (ref 70–100)

## 2013-05-18 LAB — PT/INR
PT INR: 1.3
PT: 15.9 s — ABNORMAL HIGH (ref 12.6–15.0)

## 2013-05-18 LAB — GFR: EGFR: >60

## 2013-05-18 MED ORDER — ALPRAZOLAM 0.5 MG PO TABS
0.5000 mg | ORAL_TABLET | Freq: Every evening | ORAL | Status: DC
Start: 2013-05-18 — End: 2013-05-23
  Administered 2013-05-18 – 2013-05-22 (×5): 0.5 mg via GASTROSTOMY
  Filled 2013-05-18 (×5): qty 1

## 2013-05-18 NOTE — Progress Notes (Signed)
Infectious Disease            Progress Note    05/18/2013   Adam Simmons ZOX:[id number] is a 48 y.o. male, history of hypertension, diabetes mellitus, kidney stones. Admitted with septic shock, pneumonia, respiratory failure.    Subjective:     Adam Simmons today Symptoms: Afebrile, comfortable trach collar,  improving leukocytosis. Sitting in chair. No new complaints, still  Diarrhea, rectal tube in place.    Objective:     Blood pressure 140/102, pulse 95, temperature 96.6 F (35.9 C), temperature source Temporal Artery, resp. rate 13, height 1.727 m (5\' 8" ), weight 115 kg (253 lb 8.5 oz), SpO2 100.00%.    General Appearance: Awake, responsive , on trach collar  HEENT: Pallor negative, Anicteric sclera.  status post tracheostomy; on trach collar  Lungs:  Bilateral decreased breath sounds  Heart: Normal rate.  S1 and S2.    Chest Wall: Symmetric chest wall expansion.   Abdomen: Abdomen is soft, scaphoid and non-distended. There are no signs of ascites. Bowel sounds are normal.G-tube in place   Neurological: Patient is awake, responsive    Laboratory And Diagnostic Studies:     Recent Labs   [facility] 05/18/13 0413 05/17/13 0629 05/16/13 0418    WBC 12.89* 13.38* --    HGB 9.3* 9.3* --    HCT 28.7* 28.8* --    PLT 318 316 --    NEUTRO -- -- 75     Recent Labs   Basename 05/18/13 0413 05/17/13 0629    NA 136 137    K 3.8 3.8    CL 101 103    CO2 26 25    BUN 18.1 18.7    CREAT 0.7 0.8    GLU 208* 299*    CA 8.6 8.1*         Current Med's:     Current Facility-Administered Medications   Medication Dose Route Frequency   . albuterol-ipratropium  3 mL Nebulization Q4H SCH   . cefTRIAXone  2 g Intravenous Q24H SCH   . chlorhexidine  15 mL Mouth/Throat BID   . fluconazole  200 mg Intravenous Q24H SCH   . heparin (porcine)  80 Units/kg (Adjusted) Intravenous Once   . insulin aspart  1-12 Units Subcutaneous Q4H   . insulin glargine  20 Units Subcutaneous Daily   . lactobacillus/streoptococcus  1  capsule Oral Daily   . nystatin   Topical BID   . nystatin  1 application Topical BID   . pantoprazole  40 mg Intravenous Daily   . sodium chloride (PF)  10 mL Intracatheter Daily   . sucralfate  1 g Oral Q6H   . warfarin  7.5 mg Oral Daily at 1800   . [DISCONTINUED] warfarin  5 mg Oral Daily at 1800             Assessment:      Condition.  Guarded   Septic shock; resolved   Strep pneumoniae sepsis   Coagulase-negative staph bacteremia     Respiratory failure, status post tracheostomy   Bilateral pneumonia   Possible aspiration   Diabetes mellitus   Acute kidney injury; resolved   Diarrhea; C. Difficile negative     Plan:      Continue Rocephin   Continue  Diflucan   Continue supportive care   Nephrology follow up   Discussed with patient's wife   Physical therapy      Alfonzo Beers, M.D.,FACP  05/18/2013  2:45 PM

## 2013-05-18 NOTE — Progress Notes (Signed)
[facility]- Critical Care Note     ICU Daily Progress Note        Date Time: 05/18/2013 7:05 PM  Patient Name: Adam Simmons  Attending Physician: Lawernce Ion, MD  Room: IC05/IC05-A   Admit Date: 04/24/2013  LOS: 24 days            Assessment:     Patient Active Problem List   Diagnosis   . Respiratory failure   . Pneumonia   . Hypokalemia   . ARDS (adult respiratory distress syndrome)   . DM (diabetes mellitus)   . AKI (acute kidney injury)   . Pneumococcal sepsis   s/p trach / peg 2/23  Doing well on trach collar, oob,   Improving ext strength  DVT R arm on anticoag rx.    Plan:   Swallow eval, pt/ot  Rest on PS vent at night.  Rehab placement        Subjective:   comfortable    Medications:       Scheduled Meds: PRN Meds:         albuterol-ipratropium 3 mL Nebulization Q4H SCH   cefTRIAXone 2 g Intravenous Q24H SCH   chlorhexidine 15 mL Mouth/Throat BID   fluconazole 200 mg Intravenous Q24H SCH   heparin (porcine) 80 Units/kg (Adjusted) Intravenous Once   insulin aspart 1-12 Units Subcutaneous Q4H   insulin glargine 20 Units Subcutaneous Daily   lactobacillus/streoptococcus 1 capsule Oral Daily   nystatin  Topical BID   nystatin 1 application Topical BID   pantoprazole 40 mg Intravenous Daily   sodium chloride (PF) 10 mL Intracatheter Daily   sucralfate 1 g Oral Q6H   warfarin 7.5 mg Oral Daily at 1800       Continuous Infusions:       . sodium chloride 35 mL/hr at 05/17/13 1816   . heparin 46962 units in dextrose 5% 500 mL 28 Units/kg/hr (05/18/13 0952)         acetaminophen 325 mg Q6H PRN   acetaminophen 500 mg Q4H PRN   albuterol 2.5 mg Q1H PRN   dextrose 15 g PRN   dextrose 25 mL PRN   glucagon (rDNA) 1 mg PRN   heparin (porcine) 40 Units/kg (Adjusted) PRN   heparin (porcine) 80 Units/kg (Adjusted) PRN   magnesium sulfate 1 g PRN   midazolam 2 mg Q1H PRN   morphine 2.5 mg Q2H PRN   morphine 2 mg Q2H PRN   ondansetron 4 mg Q8H PRN   potassium chloride 20 mEq PRN   sodium phosphate IVPB 15 mmol PRN    sodium phosphate IVPB 25 mmol PRN   sodium phosphate IVPB 35 mmol PRN   zinc Oxide  PRN             Physical Exam:     Filed Vitals:    05/18/13 1500 05/18/13 1600 05/18/13 1700 05/18/13 1800   BP: 138/79 129/71 131/80    Pulse: 98 97 96    Temp:    96.3 F (35.7 C)   TempSrc:    Temporal Artery   Resp: 18 21 25     Height:       Weight:       SpO2: 100% 100% 100%      Temp (24hrs), Avg:97 F (36.1 C), Min:96.3 F (35.7 C), Max:97.5 F (36.4 C)           02/26 0701 - 02/27 0700  In: 4997 [I.V.:2012]  Out: 4050 [Urine:4050]  General Appearance: nad, on trach collar, breathing spont  Mental status: alert  Neuro:  2-3 / 5 strength UE, 3-4 / 5  LE  H & N: trach site clean  Lungs: clear  Cardiac: reg  Abdomen:  nt  Extremities: trace edema R UE > L  Skin:       Data:       Vent Settings:    Vent Settings  Vent Mode: PRVC  FiO2: 40 %  Resp Rate (Set): 20   Vt (Set, mL): 500 mL  PIP Observed (cm H2O): 16 cm H2O  PEEP/EPAP: 5 cm H20  Pressure Support / IPAP: 5 cmH20  Mean Airway Pressure: 9 cmH20      Labs:     Recent CBC   Lab 05/18/13 0413 05/17/13 0629 05/16/13 0418   WBC 12.89* 13.38* 13.73*   RBC 3.36* 3.34* 3.33*   HGB 9.3* 9.3* 9.3*   HCT 28.7* 28.8* 29.1*   MCV 85.4 86.2 87.4   PLT 318 316 271         Lab 05/18/13 1709 05/18/13 0413 05/18/13 0037 05/17/13 1207 05/17/13 0629 05/16/13 0418 05/15/13 0504 05/14/13 0312   NA -- 136 -- -- 137 137 -- --   K -- 3.8 -- -- 3.8 3.8 -- --   CL -- 101 -- -- 103 105 -- --   CO2 -- 26 -- -- 25 24 -- --   GLU -- 208* -- -- 299* 215* -- --   BUN -- 18.1 -- -- 18.7 23.8* -- --   CREAT -- 0.7 -- -- 0.8 0.8 -- --   MG -- -- -- -- 1.7 1.6 1.6 --   PHOS -- -- -- -- -- 3.3 3.1 3.8   AST -- -- -- -- -- -- 20 19   ALT -- -- -- -- -- -- 30 33   ALKPHOS -- -- -- -- -- -- 86 81   BILITOTAL -- -- -- -- -- -- 0.5 0.3   BILIDIRECT -- -- -- -- -- -- -- --   LIP -- -- -- -- -- -- -- --   BNP -- -- -- -- -- -- -- --   PT -- 15.9* -- 14.8 -- -- -- 12.5*   INR -- 1.3 -- 1.2 -- -- -- 1.0    PTT 77* 92* 107* -- -- -- -- --   DDIMER -- -- -- -- -- -- -- --   CK -- -- -- -- -- -- -- --   CKMB -- -- -- -- -- -- -- --   TROPI -- -- -- -- -- -- -- --   MYOGLOBIN -- -- -- -- -- -- -- --           Rads:     Radiology Results (24 Hour)     ** No Results found for the last 24 hours. **            Signed by: Durward Fortes, MD  Date/Time: 05/18/2013 7:05 PM

## 2013-05-18 NOTE — PT Progress Note (Signed)
[facility]  [address]  [place], [place]. [address]    Department of Rehabilitation  [phone]    Physical Therapy Daily Treatment Note    Patient: Adam Simmons    MRN#: [id number]     Time of Treatment: Start Time: 1155 Stop Time: 1222 Time Calculation (min): 27 min    PT Visit Number: 8    Patient's medical condition is appropriate for Physical Therapy intervention at this time.    Precautions and Contraindications: Fall risk, trach collar trials.       Assessment: Pt presents with global weakness BUE/LE, however LE appear stronger vs UE. Poor sitting balance and trunk control due to weakness. Cognition clear, at times pt voicing over trach. Will require prolonged rehab to return to PLOF.  Assessment: Decreased LE strength;Decreased UE strength;Decreased endurance/activity tolerance;Decreased functional mobility;Decreased balance;Impaired coordination Prognosis: Good;With continued PT status post acute discharge   Progress: Progressing toward goals     Patient Goal: To get better      Plan:   Continue with Physical Therapy services to address weakness. Focus next session on therex, sitting balance.  Treatment/Interventions: Neuromuscular re-education;Functional transfer training;LE strengthening/ROM;Endurance training;Bed mobility;Compensatory technique education   PT Frequency: 4-5x/wk     Discharge Recommendation:  (LTAC for vent weaning)      Subjective: Patient is agreeable to participation in the therapy session. Nursing clears patient for therapy. No c/o pain currently. Pt nodding yes/no to questions, mouthing words, at times voicing over trach. At times frustrated with impaired communication.          Objective:  Observation of Patient/Vital Signs:  Patient is in bedside chair with telemetry, peripheral IV, O2 via trach collar and indwelling urinary catheter, PEG tube, rectal tube in place.    Cognition  Arousal/Alertness: Appropriate responses to stimuli  Attention Span: Appears  intact  Memory: Appears intact  Following Commands: Follows all commands and directions without difficulty  Insights: Fully aware of deficits    Pt OOB to chair with RN staff using lift.    Therapeutic Exercise  Hip Flexion: Seated marching Bx10, AAROM  Hip adduction v pillow, Bx10  Knee AROM : LAQ Bx10 for extension. Bx10 focusing on eccentric lowering  Ankle Pumps: Bx10-15  Elbow flex/ext Bx5. Active extension noted consistently B. Trace bicep/elbow flexion.  Instruction for pacing and performance of therex. AAROM for most for mm control. LE quickly fatigued, rest break provided between all therex.     Neuro Re-Ed  Sitting Balance: with assistance;with instruction;with support (Max A to bring shoulders forward toward HOB. Pt able to maintain upright sitting for very short time, slowly leaning back toward chair. Performed 3 sets, 5 trials each. Some with better control then others. UE very weak so difficulty using UE to support trunk. Performed scap retractions x5.)     Treatment Activities: mobility and therex as above. Pt improving with LE strength, much slower for UE strength. Pt motivated to improve but limited by global weakness and endurance deficits.    Educated the patient to role of physical therapy, plan of care, goals of therapy and HEP, safety with mobility and ADLs.    At end of session pt reclined in chair, BUE elevated on pillows. RN aware. Wife present.      Delfin Edis, PT, DPT  Pager #: [phone]

## 2013-05-19 LAB — PT/INR
PT INR: 1.6
PT: 18.4 s — ABNORMAL HIGH (ref 12.6–15.0)

## 2013-05-19 LAB — BASIC METABOLIC PANEL
Anion Gap: 14 (ref 5.0–15.0)
BUN: 19.6 mg/dL (ref 9.0–21.0)
CO2: 24 mEq/L (ref 22–29)
Calcium: 8.9 mg/dL (ref 8.5–10.5)
Chloride: 98 mEq/L (ref 98–107)
Creatinine: 0.7 mg/dL (ref 0.7–1.3)
Glucose: 174 mg/dL — ABNORMAL HIGH (ref 70–100)
Potassium: 3.9 mEq/L (ref 3.5–5.1)
Sodium: 136 mEq/L (ref 136–145)

## 2013-05-19 LAB — CBC
Hematocrit: 30.9 % — ABNORMAL LOW (ref 42.0–52.0)
Hgb: 10.2 g/dL — ABNORMAL LOW (ref 13.0–17.0)
MCH: 28.1 pg (ref 28.0–32.0)
MCHC: 33 g/dL (ref 32.0–36.0)
MCV: 85.1 fL (ref 80.0–100.0)
MPV: 10.3 fL (ref 9.4–12.3)
Platelets: 319 10*3/uL (ref 140–400)
RBC: 3.63 10*6/uL — ABNORMAL LOW (ref 4.70–6.00)
RDW: 17 % — ABNORMAL HIGH (ref 12–15)
WBC: 13.35 10*3/uL — ABNORMAL HIGH (ref 3.50–10.80)

## 2013-05-19 LAB — APTT
PTT: 75 s — ABNORMAL HIGH (ref 23–37)
PTT: 85 s (ref 23–37)

## 2013-05-19 LAB — GFR: EGFR: >60

## 2013-05-19 LAB — GLUCOSE WHOLE BLOOD - POCT
Whole Blood Glucose POCT: 175 mg/dL — ABNORMAL HIGH (ref 70–100)
Whole Blood Glucose POCT: 178 mg/dL — ABNORMAL HIGH (ref 70–100)
Whole Blood Glucose POCT: 185 mg/dL — ABNORMAL HIGH (ref 70–100)
Whole Blood Glucose POCT: 199 mg/dL — ABNORMAL HIGH (ref 70–100)
Whole Blood Glucose POCT: 205 mg/dL — ABNORMAL HIGH (ref 70–100)

## 2013-05-19 MED ORDER — PANTOPRAZOLE ORAL SUSPENSION 40 MG/20 ML UD
40.0000 mg | Freq: Every day | ORAL | Status: DC
Start: 2013-05-20 — End: 2013-05-23
  Administered 2013-05-20 – 2013-05-23 (×4): 40 mg via ORAL
  Filled 2013-05-19 (×5): qty 20

## 2013-05-19 NOTE — Plan of Care (Signed)
Problem: Potential for Compromised Skin Integrity  Goal: Skin integrity is maintained or improved  Assess and monitor skin integrity. Identify patients at risk for skin breakdown on admission and per policy. Collaborate with interdisciplinary team and initiate plans and interventions as needed.   Outcome: Progressing  Intervention: Turn patient  Pt sat up on chair and was instructed to move extremities as much as possible.   Intervention: Relieve pressure to bony prominences  Pillows were placed under bony prominences.   Intervention: Avoid shearing  One lift sheet placed under pt.       Problem: Inadequate airway clearance  Goal: Patent Airway maintained  Outcome: Progressing  Trach care done and inner cannula changed.  Intervention: Suction secretions as needed  Airway suctioned prn.       Comments:   Continue to monitor.

## 2013-05-19 NOTE — SLP Eval Note (Signed)
[facility]  [address]  [place], [place]. [address]    Department of Rehabilitation Services  [phone]    Speech and Language Therapy Bedside Swallow Evaluation    Patient: Adam Simmons    MRN#: [id number]     Time of Treatment: SLP Received On: 05/19/13  Start Time: 0945  Stop Time: 1100  Time Calculation (min): 75 min    SLP Visit Number: 1    Consult received for Adam Simmons for SLP Bedside Swallow Evaluation and Treatment and Passy Muir Speaking Valve Consult.     Assessment:   Pt presents with mild weak and mildly discoordinated swallowing functioning. Swallow reflex was mildly delayed. Laryngeal elevation was reduced. Taught pt two swallows per bite, respiration and phonation cycle for optimal voice production, cough and swallowing. No overt clinical s/sx aspiration with puree texture. No evidence of aspiration with tracheal suctioning. Swallowing and respiratory progress appear improved to begin both swallowing trials and speaking valve use.     Plan/ Recommendations:   SLP Frequency Recommended: 5x per week  Follow up treatments: oral motor exercises;pharyngeal exercises;thermal stimulation;diet tolerance monitoring;strategies training  Duration of Treatment: Weekly Reassessment  Diet Solids Recommendation: NPO except ice chips, small PO trials (upto 4 ounces) puree, applesauce, spoon thick juice  Precautions/Compensations: Awake/alert;Upright 90 degrees for all oral intake;45 degrees upright after meals;Alternate solids and liquids;Swallow multiple times per bite/sip;No straws;Small bites/sips;Eat/feed slowly;External pacing;Provide verbal instruction (cuff deflated and PMSV on)  Recommendation Discussed With: : Patient;Caregiver;Physician;Nurse;Posted bedside  Recommended Form of Meds: PEG tube  Small PO trials of applesauce or spoon thick juice by tsp with 1:1 assist with RN with PMSV on (cuff deflated) BID for 04/23/2013 and 05/20/2013.  SLP will reassess swallowing 05/21/2013 with  liquids and candidacy for starting a PO diet.    Goals:  1. Pt will swallow puree textures without clinical s/sx aspiration, without evidence of aspiration via tracheal suctioning over 48 hours  2. Pt will swallow twice for each presentations to decrease risk of pharyngeal residue with < min prompts over 48 hours.  3. Pt will wear PMSV for all PO therapeutic trials without SOB, without increase WOB, without desaturations over 48 hours  4. Pt will swallow a mechanical soft diet without clinical s/sx aspiration, without evidence of aspiration via tracheal suctioning over 6 SLP sessions  5. Pt will swallow ice chips without clinical s/sx aspiration with PMSV on over 72 hours  6. Pt will swallow Nectar Thick liquids x 4 ounces without clinical s/sx aspiration and without evidence of aspiration via tracheal suctioning over 72 hours  7. Pt and wife will verbalize and demonstrate an understanding of aspiration precautions and safe feeding techniques over 5 SLP sessions.     Discharge Recommendations:   Recommendations: Rehab consult;Acute Rehab    Medical Diagnosis: Respiratory failure [518.81]  Pneumonia [486]  Respiratory failure  AKI  pt remains febrile, culture tips of both the quintin and TLC    History of Present Illness: Adam Simmons is a 48 y.o. male admitted on 04/24/2013 with   Patient Active Problem List   Diagnosis   . Respiratory failure   . Pneumonia   . Hypokalemia   . ARDS (adult respiratory distress syndrome)   . DM (diabetes mellitus)   . AKI (acute kidney injury)   . Pneumococcal sepsis      Past Medical History   Diagnosis Date   . Hypertension    . Diabetes mellitus    . Kidney stones  Past Surgical History   Procedure Date   . Kidney stone surgery    . Tracheostomy 05/14/2013     Procedure: TRACHEOSTOMY;  Surgeon: Alen Blew, MD;  Location: [facility];  Service: General;  Laterality: N/A;  PEG AND TRACH PLACEMENT(PT IN ICU)   . Insertion, peg 05/14/2013     Procedure: INSERTION,  PEG;  Surgeon: Alen Blew, MD;  Location: [facility];  Service: General;  Laterality: N/A;       History/Current Status  Respiratory Status: history of pneumonia;tracheostomy (add size/type in comment);suctioning;mechanical ventilation support  Behavior/Mental Status: Awake/alert;Able to follow directions;Cooperative;Pleasant mood  Nutrition: PEG02/23/2015, continuous feeds at 2ml/hour. Tolerating TF per RN.     Subjective:   Patient is agreeable to participation in the therapy session. Family and/or guardian are agreeable to patient's participation in the therapy session. Nursing clears patient for therapy. Patient's medical condition is appropriate for Speech therapy intervention at this time. Wife bedside and participated.     Objective:   Observation of Patient/Vital Signs:  Patient is seated in a cardiac chair with telemetry, mechanical ventilation via tracheostomy and PEG tube, indwelling urinary catheter in place. No c/o pain.   Oral Motor Skills  Oral Motor Skills: exceptions to [facility]  Oral Motor Impairments: coordination;strength  Deglutition Skills  Position: reclined (add angle in comment) (70 degrees)  Food(s) Tested: honey thick liquid;puree  Oral Stage: adequate  Pharyngeal Stage: delayed response;reduced laryngeal elevation  Esophageal Stage:  (at risk)    Adam Simmons, M.S. CCC/SLP  Speech Language Pathologist  [place]  [phone]  05/19/2013        [facility]  [address]  [place], [place]. [address]    Department of Rehabilitation Services  [phone]    Speech and Language Therapy Speaking Valve Evaluation with Passy Muir Speaking Valve (PMSV)     Patient: Adam Simmons    MRN#: [id number]     Treatment Time: Time Calculation  SLP Received On: 05/19/13  Start Time: 0945  Stop Time: 1100  Time Calculation (min): 75 min    Consult received for Adam Simmons for SLP Speaking Valve Evaluation and Treatment.    Medical Diagnosis: Respiratory  failure [518.81]  Pneumonia [486]  Respiratory failure  AKI  pt remains febrile, culture tips of both the quintin and TLC    History of Present Illness: Adam Simmons is a 48 y.o. male admitted on 04/24/2013 with   Patient Active Problem List   Diagnosis   . Respiratory failure   . Pneumonia   . Hypokalemia   . ARDS (adult respiratory distress syndrome)   . DM (diabetes mellitus)   . AKI (acute kidney injury)   . Pneumococcal sepsis   Past Medical/Surgical History:  Past Medical History   Diagnosis Date   . Hypertension    . Diabetes mellitus    . Kidney stones       Past Surgical History   Procedure Date   . Kidney stone surgery    . Tracheostomy 05/14/2013     Procedure: TRACHEOSTOMY;  Surgeon: Alen Blew, MD;  [facility];  Service: General;  Laterality: N/A;  PEG AND TRACH PLACEMENT(PT IN ICU)   . Insertion, peg 05/14/2013     Procedure: INSERTION, PEG;  Surgeon: Alen Blew, MD;  Location: [facility];  Service: General;  Laterality: N/A;       Subjective: Patient is agreeable to participation in the therapy session. Family and/or  guardian are agreeable to patient's participation in the therapy session. Nursing clears patient for therapy. Patient's medical condition is appropriate for Speech therapy intervention at this time. Wife at bedside and participated.   Objective: Observation of Patient/Vital Signs:  Patient is seated in a cardiac chair with telemetry, mechanical ventilation via tracheostomy, PEG Tube and indwelling urinary catheter in place.  Respiratory Status: s/p tracheostomy 05/14/2013 with #8 fenestrated trach, Pressure Support Ventilator settings per RN.  Tracheostomy: Trach Collar now in daytime  Tracheostomy  Resp Rate: 19   SpO2: 100 %  FiO2: 40 %  Vent Settings:- Nocturnal  Adult Ventilator Settings  Vent Mode: PRVC  Vt (Set, mL): 500 mL  Resp Rate (Set): 20   PEEP/EPAP: 5 cm H20  Pressure Support / IPAP: 15 cmH20  FiO2: 40  %  Cognition:  Cognition  Arousal/Alertness: Appropriate responses to stimuli  Attention Span: Appears intact  Orientation Level: Oriented to person;Oriented to place  Memory: Appears intact  Following Commands: Follows all commands and directions without difficulty  Insights: Fully aware of deficits  Oral Motor: WNL  Speaking Valve Oral Communication Trial: Ordered by MD, Pt was on Trach Collar yesterday for daytime hours. LOA and awareness appropriate for PMSV assessment and trial. Cough productive.   Starting Vital: O2 Sats 95%, RR 19  Ending Vitals: O2 Sats 99%, RR 19  Treatment Activities: PMSV Education and Instructions, Respiration and Phonation instructions, models   Educated the patient to role of speech therapy, plan of care, goals of therapy, PMSV use and instructions.     Speaking Valve Assessment: Pt wore PMSV (Aqua) for one hour. Cuff deflated, mild thick opaque secretions. Cough productive. Voice weak and breathy. Coordination for speech impaired. Increased voicing with moderate verbal prompts and models for respiration and phonation. After approx 45 Pt become more fatigue and did c/o "feels sore in my throat." Denied pain. He did demonstrate awareness into fatigue.     Speaking Valve Recommendations:   1. Cuff deflation after suctioning  2. PMSV placement BID 05/19/13 for one hour trial each  3. PMSV with all PO therapeutic trials of PO  4. Trach Collar with PMSV  5. PMSV information in room, PMSV precautions and usage recommendations posted in room    Speaking Valve Goals:  1. Pt will vocalize at phrase length with moderate verbal prompts and models for respiration, phonation sequence and timing over 3 sessions.   2. Wife and Pt will verbalize an understanding of PMSV and parameters of use over 5 sessions.     Adam Simmons L. Granville Simmons, M.S. CCC/SLP  Speech Language Pathologist  [facility]  [phone]  05/19/2013

## 2013-05-19 NOTE — Progress Notes (Signed)
Infectious Disease            Progress Note    05/19/2013   Adam Simmons UEA:[id number]:[id number] is a 48 y.o. male, history of hypertension, diabetes mellitus, kidney stones. Admitted with septic shock, pneumonia, respiratory failure.    Subjective:     Adam Simmons today Symptoms: Afebrile, on trach collar,  Slightly worse leukocytosis. Improving diarrhea, rectal tube removed. Sitting in chair    Objective:     Blood pressure 114/74, pulse 98, temperature 97.5 F (36.4 C), temperature source Temporal Artery, resp. rate 16, height 1.727 m (5\' 8" ), weight 115 kg (253 lb 8.5 oz), SpO2 100.00%.    General Appearance: Awake, responsive , on trach collar  HEENT: Pallor negative, Anicteric sclera.  status post tracheostomy; on trach collar  Lungs:  Bilateral decreased breath sounds  Heart: Normal rate.  S1 and S2.    Chest Wall: Symmetric chest wall expansion.   Abdomen: Abdomen is soft, scaphoid and non-distended. There are no signs of ascites. Bowel sounds are normal.G-tube in place   Neurological: Patient is awake, responsive    Laboratory And Diagnostic Studies:     Recent Labs   [facility] 05/19/13 0408 05/18/13 0413    WBC 13.35* 12.89*    HGB 10.2* 9.3*    HCT 30.9* 28.7*    PLT 319 318    NEUTRO -- --     Recent Labs   [facility] 05/19/13 0408 05/18/13 0413    NA 136 136    K 3.9 3.8    CL 98 101    CO2 24 26    BUN 19.6 18.1    CREAT 0.7 0.7    GLU 174* 208*    CA 8.9 8.6         Current Med's:     Current Facility-Administered Medications   Medication Dose Route Frequency   . albuterol-ipratropium  3 mL Nebulization Q4H SCH   . ALPRAZolam  0.5 mg Per G Tube QHS   . chlorhexidine  15 mL Mouth/Throat BID   . heparin (porcine)  80 Units/kg (Adjusted) Intravenous Once   . insulin aspart  1-12 Units Subcutaneous Q4H   . insulin glargine  20 Units Subcutaneous Daily   . lactobacillus/streoptococcus  1 capsule Oral Daily   . nystatin   Topical BID   . nystatin  1 application Topical BID   . pantoprazole  40  mg Intravenous Daily   . sodium chloride (PF)  10 mL Intracatheter Daily   . sucralfate  1 g Oral Q6H   . warfarin  7.5 mg Oral Daily at 1800   . [DISCONTINUED] cefTRIAXone  2 g Intravenous Q24H SCH   . [DISCONTINUED] fluconazole  200 mg Intravenous Q24H SCH             Assessment:      Condition.  Guarded   Septic shock; resolved   Strep pneumoniae sepsis   Coagulase-negative staph bacteremia     Respiratory failure, status post tracheostomy   Bilateral pneumonia   Possible aspiration   Diabetes mellitus   Acute kidney injury; resolved   Diarrhea; C. Difficile negative    DVT right extremity    Plan:      Discontinue  Rocephin   Discontinue  Diflucan   Continue supportive care   Nephrology follow up   Discussed with patient's wife   Physical therapy      Alfonzo Beers, M.D.,FACP  05/19/2013  10:06 AM

## 2013-05-19 NOTE — Progress Notes (Addendum)
[facility]- Critical Care Note     ICU Daily Progress Note        Date Time: 05/19/2013 11:12 AM  Patient Name: Adam Simmons  Attending Physician: Lawernce Ion, MD  Room: IC05/IC05-A   Admit Date: 04/24/2013  LOS: 25 days        Assessment/Plan:   # Neurologic: alert. Critical care myopathy. Diffuse generalized weakness that is slowly improving.     # Respiratory: multilobar Strep Pneumonia pneumonia, resolved ARDS. S/p trach placement. Awaiting placement. Pt will benefit from vent weaning facility (LTAC). Requires vent at night.     # Nutrition: appreciate nutritionist recommendation, cont tube feeds via peg    # Endocrine: Diabetes;  Cont lantus and SSI    # Cardiac: Echo indicated EF 50%    #renal: acute kidney injury stable, appreciate Dr. Dalbert Batman group recs. Cont free water bolus q4hrs.     # Infectious diseases: streptococcus pneumonia bacteremia/ pneumonia.  Appreciate Dr. Myrtis Ser consultation and evaluation. Continue antibiotics    #Right arm DVT: cont heparin gtt. Cont coumadin. Check daily inr. Goal INR 2-3.      Code Status: full code     I have personally reviewed the patient's history and 24 hour interval events, along with vitals, labs, radiology images, and nurses report.      discussed with patient's wife at the bedside and answered her questions.        Subjective:     48 y.o. male h/o DM type 2, HTN who presents to the hospital with respiratory failure, from streptococcus pneumonia, ARDS, AKI and septic shock.      patient alert, able to move his head a lot more. Now able to raise his legs up a few inches off the floor.   Doing well sitting a chair, doing well on trach collar trial.     Hospital Course  2/3   ARDS / PNA / Sepsis  w/ resp failure- intubated,  Flu test-neg  Resp c+s= gm + cocci , x2BC= gm + cocci resembling strep  2/6 [place] placed for worsening creatinine, 14 L positive  2/8 dialysis  2/10 failed SBT  2/11 tachypnic, readjusted ventilator tidal volume.   2/13  hemodialysis catheter removed, bronchoscopy with improved pulmonary toilet  2/15 pulmonary toilet, mucomyst, chest PT vest  2/17 fever 102F, cooling blanket  2/23 trach shiley #8, cuffed, peg   2/24 pressure support trial 10/5 x 2hrs tolerated  2/26 PS trial 10/5 x 4 hrs bid  2/28 trach collar in am   Medications:   Scheduled Meds:  Current Facility-Administered Medications   Medication Dose Route Frequency   . albuterol-ipratropium  3 mL Nebulization Q4H SCH   . ALPRAZolam  0.5 mg Per G Tube QHS   . chlorhexidine  15 mL Mouth/Throat BID   . heparin (porcine)  80 Units/kg (Adjusted) Intravenous Once   . insulin aspart  1-12 Units Subcutaneous Q4H   . insulin glargine  20 Units Subcutaneous Daily   . lactobacillus/streoptococcus  1 capsule Oral Daily   . nystatin   Topical BID   . nystatin  1 application Topical BID   . pantoprazole  40 mg Intravenous Daily   . sodium chloride (PF)  10 mL Intracatheter Daily   . sucralfate  1 g Oral Q6H   . warfarin  7.5 mg Oral Daily at 1800   . [DISCONTINUED] cefTRIAXone  2 g Intravenous Q24H SCH   . [DISCONTINUED] fluconazole  200 mg Intravenous  Q24H SCH         Continuous Infusions:       . sodium chloride 35 mL/hr at 05/17/13 1816   . heparin 16109 units in dextrose 5% 500 mL 28 Units/kg/hr (05/19/13 0659)          Physical Exam:     Filed Vitals:    05/19/13 1000   BP: 118/71   Pulse: 97   Temp:    Resp: 19   SpO2: 100%         Intake/Output Summary (Last 24 hours) at 05/19/13 1112  Last data filed at 05/19/13 1017   Gross per 24 hour   Intake   3757 ml   Output   2550 ml   Net   1207 ml     General Appearance: obese, able to slowly speak with passer muir valve, trached  Mental status: alert  Neuro: awake, diffusely weak but getting slowly stronger,trached, He responds and smiles at times.Now able to raise his legs up a few inches off the floor.     Neck: supple   Lungs: bilateral rhonchi anteriorly, no wheeze  Cardiac: ns1, ns2, no m/r/g, tachycardic   Abdomen: Obese, soft,  nontender, hypoactive bowel sounds, peg site intact, no erythema  Extremities: no lower extremity edema   Skin:  Right arm swelling    Data:     Vent Settings:    Vent Settings  Vent Mode: PRVC  FiO2: 40 %  Resp Rate (Set): 20   Vt (Set, mL): 500 mL  PIP Observed (cm H2O): 16 cm H2O  PEEP/EPAP: 5 cm H20  Pressure Support / IPAP: 15 cmH20  Mean Airway Pressure: 9 cmH20        Labs:       Labs (last 72 hours):  Recent Labs   [facility] 05/19/13 0408 05/18/13 0413 05/17/13 0629    WBC 13.35* 12.89* 13.38*    HGB 10.2* 9.3* 9.3*    HCT 30.9* 28.7* 28.8*    LABPLAT -- -- --     Recent Labs   Basename 05/19/13 0408 05/19/13 0404 05/18/13 1709 05/18/13 0413 05/17/13 1207    PT 18.4* -- -- 15.9* 14.8    INR 1.6 -- -- 1.3 1.2    PTT -- 75* 77* 92* --    Recent Labs   Basename 05/19/13 0408 05/18/13 0413 05/17/13 0629    NA 136 136 137    K 3.9 3.8 3.8    CL 98 101 103    CO2 24 26 25     BUN 19.6 18.1 18.7    CREAT 0.7 0.7 0.8    GLU 174* 208* 299*    CA 8.9 8.6 8.1*    MG -- -- 1.7    PHOS -- -- --                 Rads:   Radiological Imaging personally reviewed,and agree with radiology report including:       CXR  05/14/2013   mild right lower lobe infiltrate      I have personally reviewed the patient's history and 24 hour interval events, along with vitals, labs, radiology images and nursing.         Signed by: Lawernce Ion, MD  Date/Time: 05/19/2013 11:12 AM

## 2013-05-19 NOTE — Progress Notes (Signed)
This note also relates to the following rows which could not be included:  Concentration Heparin - Cannot attach notes to extension rows  Dose (units/kg/hr) Heparin  - Cannot attach notes to extension rows  PTT (read only) - Cannot attach notes to extension rows  Rate Heparin - Cannot attach notes to extension rows       05/19/13 1612   heparin 16109 units in dextrose 5% 500 mL infusion (premix)   Action taken per nomogram? Other (comment)  (none- wnl)   Date of Next PTT? 05/20/13   Time of Next PTT? 0400    Theraputic per heparin moderate intensity nomogram. Recheck ptt in 12 hours 05/20/13 @ 0400. Adam Simmons

## 2013-05-19 NOTE — SLP Progress Note (Signed)
Speech Language Pathology  [facility]   [address]   [place], [place]. [address]   Department of Rehabilitation Services   [phone]     Speech and Language Therapy Speaking Valve Evaluation with Passy Muir Speaking Valve (PMSV)   Patient: Adam Simmons MRN#: [id number]   Treatment Time: Time Calculation   SLP Received On: 05/19/13   Start Time: 0945   Stop Time: 1100   Time Calculation (min): 75 min     Consult received for Adam Simmons for SLP Speaking Valve Evaluation and Treatment.     Speaking Valve Assessment: Pt wore PMSV (Aqua) for one hour. Cuff deflated, mild thick opaque secretions. Cough productive. Voice weak and breathy. Coordination for speech impaired. Increased voicing with moderate verbal prompts and models for respiration and phonation. After approx 45 Pt become more fatigue and did c/o "feels sore in my throat." Denied pain. He did demonstrate awareness into fatigue.     Speaking Valve Recommendations:   1. Cuff deflation after suctioning   2. PMSV placement BID 05/19/13 for one hour trial each   3. PMSV with all PO therapeutic trials of PO   4. Trach Collar with PMSV   5. PMSV information in room, PMSV precautions and usage recommendations posted in room     Speaking Valve Goals:   1. Pt will vocalize at phrase length with moderate verbal prompts and models for respiration, phonation sequence and timing over 3 sessions.   2. Wife and Pt will verbalize an understanding of PMSV and parameters of use over 5 sessions.     Medical Diagnosis: Respiratory failure [518.81]   Pneumonia [486]   Respiratory failure   AKI   pt remains febrile, culture tips of both the quintin and TLC   History of Present Illness: Adam Simmons is a 48 y.o. male admitted on 04/24/2013 with   Patient Active Problem List    Diagnosis    .  Respiratory failure    .  Pneumonia    .  Hypokalemia    .  ARDS (adult respiratory distress syndrome)    .  DM (diabetes mellitus)    .  AKI (acute kidney injury)    .   Pneumococcal sepsis    Past Medical/Surgical History:   Past Medical History    Diagnosis  Date    .  Hypertension     .  Diabetes mellitus     .  Kidney stones       Past Surgical History    Procedure  Date    .  Kidney stone surgery     .  Tracheostomy  05/14/2013      Procedure: TRACHEOSTOMY; Surgeon: Alen Blew, MD; [facility]; Service: General; Laterality: N/A; PEG AND TRACH PLACEMENT(PT IN ICU)    .  Insertion, peg  05/14/2013      Procedure: INSERTION, PEG; Surgeon: Alen Blew, MD; Location: [facility]; Service: General; Laterality: N/A;      Subjective: Patient is agreeable to participation in the therapy session. Family and/or guardian are agreeable to patient's participation in the therapy session. Nursing clears patient for therapy. Patient's medical condition is appropriate for Speech therapy intervention at this time. Wife at bedside and participated.     Objective: Observation of Patient/Vital Signs: Patient is seated in a cardiac chair with telemetry, mechanical ventilation via tracheostomy, PEG Tube and indwelling urinary catheter in place.   Respiratory Status: s/p tracheostomy 05/14/2013 with #8 fenestrated trach,  Pressure Support Ventilator settings per RN.   Tracheostomy: Trach Collar now in daytime   Tracheostomy   Resp Rate: 19   SpO2: 100 %   FiO2: 40 %   Vent Settings:- Nocturnal   Adult Ventilator Settings   Vent Mode: PRVC   Vt (Set, mL): 500 mL   Resp Rate (Set): 20   PEEP/EPAP: 5 cm H20   Pressure Support / IPAP: 15 cmH20   FiO2: 40 %   Cognition:   Cognition   Arousal/Alertness: Appropriate responses to stimuli   Attention Span: Appears intact   Orientation Level: Oriented to person;Oriented to place   Memory: Appears intact   Following Commands: Follows all commands and directions without difficulty   Insights: Fully aware of deficits   Oral Motor: WNL   Speaking Valve Oral Communication Trial: Ordered by MD, Pt was on Trach Collar yesterday  for daytime hours. LOA and awareness appropriate for PMSV assessment and trial. Cough productive.   Starting Vital: O2 Sats 95%, RR 19   Ending Vitals: O2 Sats 99%, RR 19   Treatment Activities: PMSV Education and Instructions, Respiration and Phonation instructions, models   Educated the patient to role of speech therapy, plan of care, goals of therapy, PMSV use and instructions.     Adaiah Jaskot L. Granville Lewis, M.S. CCC/SLP   Speech Language Pathologist   [facility]   [phone]   05/19/2013

## 2013-05-20 LAB — CBC
Hematocrit: 30.1 % — ABNORMAL LOW (ref 42.0–52.0)
Hgb: 10 g/dL — ABNORMAL LOW (ref 13.0–17.0)
MCH: 28.1 pg (ref 28.0–32.0)
MCHC: 33.2 g/dL (ref 32.0–36.0)
MCV: 84.6 fL (ref 80.0–100.0)
MPV: 10.8 fL (ref 9.4–12.3)
Platelets: 317 10*3/uL (ref 140–400)
RBC: 3.56 10*6/uL — ABNORMAL LOW (ref 4.70–6.00)
RDW: 17 % — ABNORMAL HIGH (ref 12–15)
WBC: 12.98 10*3/uL — ABNORMAL HIGH (ref 3.50–10.80)

## 2013-05-20 LAB — BASIC METABOLIC PANEL
Anion Gap: 14 (ref 5.0–15.0)
BUN: 19.5 mg/dL (ref 9.0–21.0)
CO2: 26 mEq/L (ref 22–29)
Calcium: 9.2 mg/dL (ref 8.5–10.5)
Chloride: 95 mEq/L — ABNORMAL LOW (ref 98–107)
Creatinine: 0.8 mg/dL (ref 0.7–1.3)
Glucose: 226 mg/dL — ABNORMAL HIGH (ref 70–100)
Potassium: 4.1 mEq/L (ref 3.5–5.1)
Sodium: 135 mEq/L — ABNORMAL LOW (ref 136–145)

## 2013-05-20 LAB — GLUCOSE WHOLE BLOOD - POCT
Whole Blood Glucose POCT: 216 mg/dL — ABNORMAL HIGH (ref 70–100)
Whole Blood Glucose POCT: 191 mg/dL — ABNORMAL HIGH (ref 70–100)
Whole Blood Glucose POCT: 168 mg/dL — ABNORMAL HIGH (ref 70–100)
Whole Blood Glucose POCT: 166 mg/dL — ABNORMAL HIGH (ref 70–100)
Whole Blood Glucose POCT: 182 mg/dL — ABNORMAL HIGH (ref 70–100)
Whole Blood Glucose POCT: 139 mg/dL — ABNORMAL HIGH (ref 70–100)

## 2013-05-20 LAB — PT/INR
PT INR: 2.1
PT: 23 s — ABNORMAL HIGH (ref 12.6–15.0)

## 2013-05-20 LAB — APTT
PTT: 138 s (ref 23–37)
PTT: 67 s — ABNORMAL HIGH (ref 23–37)

## 2013-05-20 LAB — GFR: EGFR: >60

## 2013-05-20 NOTE — Progress Notes (Signed)
Infectious Disease            Progress Note    05/20/2013   Adam Simmons LOV:[id number] is a 48 y.o. male, history of hypertension, diabetes mellitus, kidney stones. Admitted with septic shock, pneumonia, respiratory failure.    Subjective:     Adam Simmons today Symptoms: No new complaints. Afebrile, on trach collar,   Improving leukocytosis. Improving diarrhea. Sitting in chair    Objective:     Blood pressure 144/84, pulse 88, temperature 97.1 F (36.2 C), temperature source Temporal Artery, resp. rate 25, height 1.727 m (5\' 8" ), weight 115 kg (253 lb 8.5 oz), SpO2 100.00%.    General Appearance: Awake, responsive , on trach collar  HEENT: Pallor negative, Anicteric sclera.  status post tracheostomy; on trach collar  Lungs:  Bilateral decreased breath sounds  Heart: Normal rate.  S1 and S2.    Chest Wall: Symmetric chest wall expansion.   Abdomen: Abdomen is soft, scaphoid and non-distended. There are no signs of ascites. Bowel sounds are normal. G-tube in place   Neurological: Patient is awake, responsive, sitting in chair    Laboratory And Diagnostic Studies:     Recent Labs   [facility] 05/20/13 0427 05/19/13 0408    WBC 12.98* 13.35*    HGB 10.0* 10.2*    HCT 30.1* 30.9*    PLT 317 319    NEUTRO -- --     Recent Labs   [facility] 05/20/13 0427 05/19/13 0408    NA 135* 136    K 4.1 3.9    CL 95* 98    CO2 26 24    BUN 19.5 19.6    CREAT 0.8 0.7    GLU 226* 174*    CA 9.2 8.9         Current Med's:     Current Facility-Administered Medications   Medication Dose Route Frequency   . albuterol-ipratropium  3 mL Nebulization Q4H SCH   . ALPRAZolam  0.5 mg Per G Tube QHS   . chlorhexidine  15 mL Mouth/Throat BID   . heparin (porcine)  80 Units/kg (Adjusted) Intravenous Once   . insulin aspart  1-12 Units Subcutaneous Q4H   . insulin glargine  20 Units Subcutaneous Daily   . lactobacillus/streoptococcus  1 capsule Oral Daily   . nystatin   Topical BID   . nystatin  1 application Topical BID   .  pantoprazole  40 mg Oral Daily   . sodium chloride (PF)  10 mL Intracatheter Daily   . sucralfate  1 g Oral Q6H   . warfarin  7.5 mg Oral Daily at 1800   . [DISCONTINUED] pantoprazole  40 mg Intravenous Daily             Assessment:      Condition.  Guarded   Septic shock; resolved   Strep pneumoniae sepsis   Coagulase-negative staph bacteremia     Respiratory failure, status post tracheostomy   Bilateral pneumonia   Possible aspiration   Diabetes mellitus   Acute kidney injury; resolved   Diarrhea; C. Difficile negative    DVT right extremity    Plan:      Will monitor without antibiotics   Continue supportive care   Nephrology follow up   Discussed with patient's wife   Physical therapy   Discussed with Dr. Roxan Hockey, M.D.,FACP  05/20/2013  10:19 AM

## 2013-05-20 NOTE — Progress Notes (Signed)
[facility]- Critical Care Note     ICU Daily Progress Note        Date Time: 05/20/2013 11:50 AM  Patient Name: [facility]  Attending Physician: Lawernce Ion, MD  Room: IC05/IC05-A   Admit Date: 04/24/2013  LOS: 26 days        Assessment/Plan:   # Neurologic: alert. Critical care myopathy. Diffuse generalized weakness that is slowly improving.     # Respiratory: multilobar Strep Pneumonia pneumonia, resolved ARDS.  Still tachypnic on ventilator. Cont chest physiotherapy and mucomyst. S/p trach placement. Awaiting placement. Pt will benefit from vent weaning facility (LTAC). Requires vent at night.     # Nutrition: appreciate nutritionist recommendation, cont tube feeds via peg    # Endocrine: Diabetes;  Cont lantus and SSI    # Cardiac: Echo indicated EF 50%    #renal: acute kidney injury stable, appreciate Dr. Dalbert Batman group recs. Cont free water bolus q4hrs.     # Infectious diseases: streptococcus pneumonia bacteremia/ pneumonia.  Appreciate Dr. Myrtis Ser consultation and evaluation. Continue antibiotics    #Right arm DVT: therapeutic INR.   Cont coumadin 7.5 mg qhs.  D/c heparin gtt. If INR>2.5, plan for coumadin to decrease to 5mg  qhs.    Code Status: full code     I have personally reviewed the patient's history and 24 hour interval events, along with vitals, labs, radiology images, and nurses report.      discussed with patient's wife at the bedside and answered her questions.        Subjective:     48 y.o. male h/o DM type 2, HTN who presents to the hospital with respiratory failure, from streptococcus pneumonia, ARDS, AKI and septic shock.      patient alert, able to move his head a lot more. Now able to raise his legs up a few inches off the floor.   Doing well sitting a chair, doing well on trach collar trial.     Hospital Course  2/3   ARDS / PNA / Sepsis  w/ resp failure- intubated,  Flu test-neg  Resp c+s= gm + cocci , x2BC= gm + cocci resembling strep  2/6 [place] placed for  worsening creatinine, 14 L positive  2/8 dialysis  2/10 failed SBT  2/11 tachypnic, readjusted ventilator tidal volume.   2/13 hemodialysis catheter removed, bronchoscopy with improved pulmonary toilet  2/15 pulmonary toilet, mucomyst, chest PT vest  2/17 fever 102F, cooling blanket  2/23 trach shiley #8, cuffed, peg   2/24 pressure support trial 10/5 x 2hrs tolerated  2/26 PS trial 10/5 x 4 hrs bid  2/28 trach collar in am     Medications:   Scheduled Meds:  Current Facility-Administered Medications   Medication Dose Route Frequency   . albuterol-ipratropium  3 mL Nebulization Q4H SCH   . ALPRAZolam  0.5 mg Per G Tube QHS   . chlorhexidine  15 mL Mouth/Throat BID   . heparin (porcine)  80 Units/kg (Adjusted) Intravenous Once   . insulin aspart  1-12 Units Subcutaneous Q4H   . insulin glargine  20 Units Subcutaneous Daily   . lactobacillus/streoptococcus  1 capsule Oral Daily   . nystatin   Topical BID   . nystatin  1 application Topical BID   . pantoprazole  40 mg Oral Daily   . sodium chloride (PF)  10 mL Intracatheter Daily   . sucralfate  1 g Oral Q6H   . warfarin  7.5 mg Oral Daily  at 1800         Continuous Infusions:       . heparin 04540 units in dextrose 5% 500 mL 25 Units/kg/hr (05/20/13 1030)          Physical Exam:     Filed Vitals:    05/20/13 0942   BP:    Pulse:    Temp:    Resp:    SpO2: 100%         Intake/Output Summary (Last 24 hours) at 05/20/13 1150  Last data filed at 05/20/13 1040   Gross per 24 hour   Intake 3752.32 ml   Output   2250 ml   Net 1502.32 ml     General Appearance: obese, able to slowly speak with passer muir valve, trached  Mental status: alert  Neuro: awake, diffusely weak but getting slowly stronger,trached, He responds and smiles at times.Now able to raise his legs up a few inches off the floor.     Neck: supple   Lungs: bilateral rhonchi anteriorly, no wheeze  Cardiac: ns1, ns2, no m/r/g, tachycardic   Abdomen: Obese, soft, nontender, hypoactive bowel sounds, peg site  intact, no erythema  Extremities: no lower extremity edema   Skin:  Right arm swelling    Data:     Vent Settings:    Vent Settings  Vent Mode: PRVC  FiO2: 40 %  Resp Rate (Set): 20   Vt (Set, mL): 500 mL  PIP Observed (cm H2O): 14 cm H2O  PEEP/EPAP: 5 cm H20  Pressure Support / IPAP: 15 cmH20  Mean Airway Pressure: 9 cmH20        Labs:       Labs (last 72 hours):  Recent Labs   Basename 05/20/13 0427 05/19/13 0408 05/18/13 0413    WBC 12.98* 13.35* 12.89*    HGB 10.0* 10.2* 9.3*    HCT 30.1* 30.9* 28.7*    LABPLAT -- -- --     Recent Labs   Basename 05/20/13 1050 05/20/13 0427 05/19/13 1612 05/19/13 0408 05/18/13 0413 05/17/13 1207    PT -- -- -- 18.4* 15.9* 14.8    INR -- -- -- 1.6 1.3 1.2    PTT 67* 138* 85* -- -- --    Recent Labs   Basename 05/20/13 0427 05/19/13 0408 05/18/13 0413    NA 135* 136 136    K 4.1 3.9 3.8    CL 95* 98 101    CO2 26 24 26     BUN 19.5 19.6 18.1    CREAT 0.8 0.7 0.7    GLU 226* 174* 208*    CA 9.2 8.9 8.6    MG -- -- --    PHOS -- -- --                 Rads:   Radiological Imaging personally reviewed,and agree with radiology report including:       CXR  05/14/2013   mild right lower lobe infiltrate      I have personally reviewed the patient's history and 24 hour interval events, along with vitals, labs, radiology images and nursing.         Signed by: Lawernce Ion, MD  Date/Time: 05/20/2013 11:50 AM

## 2013-05-21 DIAGNOSIS — I82629 Acute embolism and thrombosis of deep veins of unspecified upper extremity: Secondary | ICD-10-CM | POA: Diagnosis not present

## 2013-05-21 LAB — BASIC METABOLIC PANEL
Anion Gap: 12 (ref 5.0–15.0)
BUN: 20.5 mg/dL (ref 9.0–21.0)
CO2: 29 mEq/L (ref 22–29)
Calcium: 9.4 mg/dL (ref 8.5–10.5)
Chloride: 96 mEq/L — ABNORMAL LOW (ref 98–107)
Creatinine: 0.8 mg/dL (ref 0.7–1.3)
Glucose: 189 mg/dL — ABNORMAL HIGH (ref 70–100)
Potassium: 4.1 mEq/L (ref 3.5–5.1)
Sodium: 137 mEq/L (ref 136–145)

## 2013-05-21 LAB — CBC
Hematocrit: 32.2 % — ABNORMAL LOW (ref 42.0–52.0)
Hgb: 10.4 g/dL — ABNORMAL LOW (ref 13.0–17.0)
MCH: 27.6 pg — ABNORMAL LOW (ref 28.0–32.0)
MCHC: 32.3 g/dL (ref 32.0–36.0)
MCV: 85.4 fL (ref 80.0–100.0)
MPV: 11 fL (ref 9.4–12.3)
Platelets: 302 10*3/uL (ref 140–400)
RBC: 3.77 10*6/uL — ABNORMAL LOW (ref 4.70–6.00)
RDW: 18 % — ABNORMAL HIGH (ref 12–15)
WBC: 13 10*3/uL — ABNORMAL HIGH (ref 3.50–10.80)

## 2013-05-21 LAB — GLUCOSE WHOLE BLOOD - POCT
Whole Blood Glucose POCT: 175 mg/dL — ABNORMAL HIGH (ref 70–100)
Whole Blood Glucose POCT: 177 mg/dL — ABNORMAL HIGH (ref 70–100)
Whole Blood Glucose POCT: 183 mg/dL — ABNORMAL HIGH (ref 70–100)
Whole Blood Glucose POCT: 190 mg/dL — ABNORMAL HIGH (ref 70–100)
Whole Blood Glucose POCT: 197 mg/dL — ABNORMAL HIGH (ref 70–100)
Whole Blood Glucose POCT: 198 mg/dL — ABNORMAL HIGH (ref 70–100)

## 2013-05-21 LAB — GFR: EGFR: >60

## 2013-05-21 LAB — PT/INR
PT INR: 2.4
PT: 25.5 s — ABNORMAL HIGH (ref 12.6–15.0)

## 2013-05-21 MED ORDER — WARFARIN SODIUM 5 MG PO TABS
5.0000 mg | ORAL_TABLET | Freq: Every day | ORAL | Status: DC
Start: 2013-05-21 — End: 2013-05-22
  Administered 2013-05-21: 5 mg via ORAL
  Filled 2013-05-21: qty 1

## 2013-05-21 NOTE — Progress Notes (Signed)
Infectious Disease            Progress Note    05/21/2013   Shariff Lasky [id number] is a 48 y.o. male, history of hypertension, diabetes mellitus, kidney stones. Admitted with septic shock, pneumonia, respiratory failure.    Subjective:     Adam Simmons today Symptoms: Afebrile, on trach collar,  leukocytosis. Improving diarrhea. Sitting in chair. No new complaints    Objective:     Blood pressure 143/83, pulse 92, temperature 97.5 F (36.4 C), temperature source Temporal Artery, resp. rate 20, height 1.727 m (5\' 8" ), weight 110.8 kg (244 lb 4.3 oz), SpO2 100.00%.    General Appearance: Awake, responsive , on trach collar  HEENT: Pallor negative, Anicteric sclera.  status post tracheostomy; on trach collar  Lungs:  Bilateral decreased breath sounds  Heart: Normal rate.  S1 and S2.    Chest Wall: Symmetric chest wall expansion.   Abdomen: Abdomen is soft, scaphoid and non-distended. There are no signs of ascites. Bowel sounds are normal. G-tube in place   Neurological: Patient is awake, responsive, sitting in chair    Laboratory And Diagnostic Studies:     Recent Labs   [facility] 05/21/13 0422 05/20/13 0427    WBC 13.00* 12.98*    HGB 10.4* 10.0*    HCT 32.2* 30.1*    PLT 302 317    NEUTRO -- --     Recent Labs   [facility] 05/21/13 0422 05/20/13 0427    NA 137 135*    K 4.1 4.1    CL 96* 95*    CO2 29 26    BUN 20.5 19.5    CREAT 0.8 0.8    GLU 189* 226*    CA 9.4 9.2         Current Med's:     Current Facility-Administered Medications   Medication Dose Route Frequency   . albuterol-ipratropium  3 mL Nebulization Q4H SCH   . ALPRAZolam  0.5 mg Per G Tube QHS   . chlorhexidine  15 mL Mouth/Throat BID   . insulin aspart  1-12 Units Subcutaneous Q4H   . insulin glargine  20 Units Subcutaneous Daily   . lactobacillus/streoptococcus  1 capsule Oral Daily   . nystatin   Topical BID   . nystatin  1 application Topical BID   . pantoprazole  40 mg Oral Daily   . sodium chloride (PF)  10 mL  Intracatheter Daily   . sucralfate  1 g Oral Q6H   . warfarin  7.5 mg Oral Daily at 1800   . [DISCONTINUED] heparin (porcine)  80 Units/kg (Adjusted) Intravenous Once             Assessment:      Condition.  Guarded   Septic shock; resolved   Strep pneumoniae sepsis   Coagulase-negative staph bacteremia     Respiratory failure, status post tracheostomy   Bilateral pneumonia   Possible aspiration   Diabetes mellitus   Acute kidney injury; resolved   Diarrhea; C. Difficile negative    DVT right extremity    Plan:      Will monitor without antibiotics   Continue supportive care   Nephrology follow up   Physical therapy   Discussed with Dr. Lesle Reek   Awaiting rehabilitation placement      Alfonzo Beers, M.D.,FACP  05/21/2013  7:16 AM

## 2013-05-21 NOTE — PT Progress Note (Signed)
[facility]  [address]  [place], [place]. [address]    Department of Rehabilitation  [phone]    Physical Therapy Daily Treatment Note    Patient: Adam Simmons    MRN#: [id number]     Time of Treatment: Start Time: 1238 Stop Time: 1316 Time Calculation (min): 38 min    PT Visit Number: 9    Patient's medical condition is appropriate for Physical Therapy intervention at this time.    Precautions and Contraindications: Fall risk, trach     Assessment: Pt with improved ability to perform bed therex during session. Steadily improving with LE movement but continues with increased weakness at trunk and for global movements.  Assessment: Decreased UE strength;Decreased LE strength;Decreased endurance/activity tolerance;Decreased functional mobility;Decreased balance Prognosis: Good;With continued PT status post acute discharge   Progress: Progressing toward goals       Plan:   Continue with Physical Therapy services to address weakness. Focus next session on sitting balance.  Treatment/Interventions: Neuromuscular re-education;Functional transfer training;LE strengthening/ROM;Endurance training;Bed mobility;Compensatory technique education   PT Frequency: 4-5x/wk     Discharge Recommendation:  LTAC vs acute rehab that will take pts with trach        Subjective: Patient is agreeable to participation in the therapy session. Nursing clears patient for therapy. Pt asking to use the bathroom. Lift sling under pt already.         Objective:  Observation of Patient/Vital Signs:  Patient is seated in a bedside chair with telemetry, O2 via trach collar and PEG tube, indwelling urinary catheter in place.    Cognition  Arousal/Alertness: Appropriate responses to stimuli  Attention Span: Appears intact  Following Commands: Follows all commands and directions without difficulty  Safety Awareness: minimal verbal instruction  Insights: Fully aware of deficits    Functional Mobility  Rolling: Max assist to left;Max  assist to right (Rolled B multiple times during session. Pt able to assist with flexing LE and reaching UE across body. Able to slightly grab bed ral)  Transfers  Chair to Bed: Dependent (Using overhead lift)       Therapeutic Exercise  Heelslides: Bx10, AAROM  Hip Abduction: /adduction, supine, AAROM Bx10  Ankle Pumps: Bx10  Instruction for pacing of all exercises. Encouraged to perform LE therex throughout the day to decrease effects of immobility.     Treatment Activities: Pt needed to use bed pan. Placed pt on bedpan using lift in chair. Requested to return to bed for nap. Assisted pt back to bed after pericare performed using lift. Increased mobility required due to equipment difficulties. Increased bed mobility performed, pt's respiratory status stable throughout. Frequent coughing and clearing tan secretions from trach. Encouraged to get OOB with RN staff after rest, pt agreeable.     Educated the patient to role of physical therapy, plan of care, goals of therapy and HEP, safety with mobility and ADLs.    At end of session pt supine in bed, all medical equipment intact. RN aware.      Delfin Edis, PT, DPT  Pager #: [phone]

## 2013-05-21 NOTE — Progress Notes (Signed)
Daily Progress Note    Date Time: 05/21/2013 11:23 AM  Patient Name: Adam Simmons  Attending Physician: Lawernce Ion, MD  Room: IC05/IC05-A   Admit Date: 04/24/2013  LOS: 27 days        Assessment/Plan:   #Neuro: alert and oriented. passy muir valve on to help with communication.     #Cardio: ST. BP stable. afebrile    #Resp: trach collar. Minimal secretions. Trach/trach ties changed    #GI: rounded/soft. BM x2    #Renal /Fluid, Electrolytes : foley- u/o adequate.      #Endo: accuchecks-coverage given.    #Skin: Desitin applied to peri area.  Nystatin ointment ordered for peri area.     #Nutrition: TFs to goal. Free water given. Prosource given.     #Lines: TLC PICC.     #Prophylaxis:   GI Prophylaxis:  pepcid  VTE Prophylaxis: lovenox and SCDs.    Wife at bedside and updated. Pt's home CPAP machine given to Judeth Cornfield (wife) to take home. Waiting for long term placement.        Medications:   Scheduled Meds:  Current Facility-Administered Medications   Medication Dose Route Frequency   . albuterol-ipratropium  3 mL Nebulization Q4H SCH   . ALPRAZolam  0.5 mg Per G Tube QHS   . insulin aspart  1-12 Units Subcutaneous Q4H   . insulin glargine  20 Units Subcutaneous Daily   . lactobacillus/streoptococcus  1 capsule Oral Daily   . nystatin   Topical BID   . pantoprazole  40 mg Oral Daily   . sodium chloride (PF)  10 mL Intracatheter Daily   . sucralfate  1 g Oral Q6H   . warfarin  7.5 mg Oral Daily at 1800   . [DISCONTINUED] chlorhexidine  15 mL Mouth/Throat BID   . [DISCONTINUED] heparin (porcine)  80 Units/kg (Adjusted) Intravenous Once   . [DISCONTINUED] nystatin  1 application Topical BID         Continuous Infusions:       . [DISCONTINUED] heparin 16109 units in dextrose 5% 500 mL Stopped (05/20/13 1130)        Data:    Vent Settings:    Vent Settings  Vent Mode: PRVC  FiO2: 40 %  Resp Rate (Set): 20   Vt (Set, mL): 500 mL  PIP Observed (cm H2O): 13 cm H2O  PEEP/EPAP: 5 cm H20  Pressure Support / IPAP: 15  cmH20  Mean Airway Pressure: 8 cmH20        Labs:   Labs (last 72 hours):  Recent Labs   Basename 05/21/13 0422 05/20/13 0427 05/19/13 0408    WBC 13.00* 12.98* 13.35*    HGB 10.4* 10.0* 10.2*    HCT 32.2* 30.1* 30.9*    LABPLAT -- -- --     Recent Labs   Basename 05/21/13 0422 05/20/13 1050 05/20/13 0427 05/19/13 1612 05/19/13 0408    PT 25.5* 23.0* -- -- 18.4*    INR 2.4 2.1 -- -- 1.6    PTT -- 67* 138* 85* --    Recent Labs   Basename 05/21/13 0422 05/20/13 0427 05/19/13 0408    NA 137 135* 136    K 4.1 4.1 3.9    CL 96* 95* 98    CO2 29 26 24     BUN 20.5 19.5 19.6    CREAT 0.8 0.8 0.7    GLU 189* 226* 174*    CA 9.4 9.2 8.9    MG -- -- --  PHOS -- -- --

## 2013-05-21 NOTE — Progress Notes (Signed)
Case Manager, today, talked to Patsy Lager [phone]), the admission coordinator, with the [facility] facility and faxed her the updated notes, including progress notes, pt's face sheet and insurance information, as she requested.  CM will f/u as needed.

## 2013-05-21 NOTE — Plan of Care (Signed)
Problem: Potential for Compromised Skin Integrity  Goal: Skin integrity is maintained or improved  Assess and monitor skin integrity. Identify patients at risk for skin breakdown on admission and per policy. Collaborate with interdisciplinary team and initiate plans and interventions as needed.   Outcome: Progressing  Intervention: Turn patient  Pt was turned Q2hrs. Pt was able to sit up in chair twice.   Intervention: Relieve pressure to bony prominences  Pillows placed under bony prominences to decrease pressure.   Intervention: Keep skin clean and dry  Nystatin ointment and Desitin was applied on groin area.   Intervention: Monitor patient's hygiene practices  Peri and oral care done. Lift sheets and gowns changed.     Goal: Nutritional status is improving  Monitor and assess patient for malnutrition (ex- brittle hair, bruises, dry skin, pale skin and conjunctiva, muscle wasting, smooth red tongue, and disorientation). Collaborate with interdisciplinary team and initiate plan and interventions as ordered. Monitor patient's weight and dietary intake as ordered or per policy. Utilize nutrition screening tool and intervene per policy. Determine patient's food preferences and provide high-protein, high-caloric foods as appropriate.   Outcome: Progressing  Intervention: Encourage patient to take supplement as ordered  Pt was given ProSource TID.       Problem: Inadequate Gas Exchange  Goal: Adequately oxygenating and ventilation is improved  Outcome: Progressing  Intervention: Position patient for maximum ventilatory efficiency  Pt sat up in chair twice today. Pt was taken off oxygen and SpO2 was 95-100.   Intervention: Encourage/Assist patient as needed to turn, cough and perform deep breathing every 2 hours.  Pt able to cough up mucous. Suctioned prn.

## 2013-05-21 NOTE — Progress Notes (Signed)
[facility]- Critical Care Note     ICU Daily Progress Note        Date Time: 05/21/2013 7:32 PM  Patient Name: Adam Simmons  Attending Physician: Lawernce Ion, MD  Room: IC05/IC05-A   Admit Date: 04/24/2013  LOS: 27 days            Assessment:     Patient Active Problem List   Diagnosis   . Respiratory failure   . Pneumonia   . Hypokalemia   . ARDS (adult respiratory distress syndrome)   . DM (diabetes mellitus)   . AKI (acute kidney injury)   . Pneumococcal sepsis   s/p trach / peg 2/23  Getting stronger, oob to chair.  Moves hands, feet.  Off abx.  Trach collar, PS at night  R arm DVT on rx  Plan:   Pt/ot, pending rehab placement.  Speech eval and rx.        Subjective:   Responsive, comfortable    Medications:       Scheduled Meds: PRN Meds:         albuterol-ipratropium 3 mL Nebulization Q4H SCH   ALPRAZolam 0.5 mg Per G Tube QHS   insulin aspart 1-12 Units Subcutaneous Q4H   insulin glargine 20 Units Subcutaneous Daily   lactobacillus/streoptococcus 1 capsule Oral Daily   nystatin  Topical BID   pantoprazole 40 mg Oral Daily   sodium chloride (PF) 10 mL Intracatheter Daily   sucralfate 1 g Oral Q6H   warfarin 5 mg Oral Daily at 1800   [DISCONTINUED] chlorhexidine 15 mL Mouth/Throat BID   [DISCONTINUED] nystatin 1 application Topical BID   [DISCONTINUED] warfarin 7.5 mg Oral Daily at 1800       Continuous Infusions:         acetaminophen 500 mg Q4H PRN   albuterol 2.5 mg Q1H PRN   dextrose 15 g PRN   dextrose 25 mL PRN   glucagon (rDNA) 1 mg PRN   magnesium sulfate 1 g PRN   midazolam 2 mg Q1H PRN   morphine 2.5 mg Q2H PRN   morphine 2 mg Q2H PRN   ondansetron 4 mg Q8H PRN   potassium chloride 20 mEq PRN   sodium phosphate IVPB 15 mmol PRN   sodium phosphate IVPB 25 mmol PRN   sodium phosphate IVPB 35 mmol PRN   zinc Oxide  PRN             Physical Exam:     Filed Vitals:    05/21/13 1500 05/21/13 1600 05/21/13 1700 05/21/13 1800   BP: 107/66 113/97 127/79 118/72   Pulse: 118 115 110 105   Temp:     96.6 F (35.9 C)   TempSrc:    Temporal Artery   Resp: 14 19 17 23    Height:       Weight:       SpO2: 97% 97% 98% 93%     Temp (24hrs), Avg:96.9 F (36.1 C), Min:96.2 F (35.7 C), Max:97.5 F (36.4 C)           03/01 0701 - 03/02 0700  In: 2393.67 [I.V.:326.67]  Out: 2150 [Urine:1650]       General Appearance: nad  Mental status: alert  Neuro:  3-4 / 5 strength  H & N: trach site clean  Lungs: few basilar rhonchi  Cardiac: reg  Abdomen:  Soft, peg intact  Extremities: trace edema  Skin: warm      Data:  Vent Settings:    Vent Settings  Vent Mode: PRVC  FiO2: 40 %  Resp Rate (Set): 20   Vt (Set, mL): 500 mL  PIP Observed (cm H2O): 13 cm H2O  PEEP/EPAP: 5 cm H20  Pressure Support / IPAP: 15 cmH20  Mean Airway Pressure: 8 cmH20      Labs:     Recent CBC   Lab 05/21/13 0422 05/20/13 0427 05/19/13 0408   WBC 13.00* 12.98* 13.35*   RBC 3.77* 3.56* 3.63*   HGB 10.4* 10.0* 10.2*   HCT 32.2* 30.1* 30.9*   MCV 85.4 84.6 85.1   PLT 302 317 319         Lab 05/21/13 0422 05/20/13 1050 05/20/13 0427 05/19/13 1612 05/19/13 0408 05/17/13 0629 05/16/13 0418 05/15/13 0504   NA 137 -- 135* -- 136 -- -- --   K 4.1 -- 4.1 -- 3.9 -- -- --   CL 96* -- 95* -- 98 -- -- --   CO2 29 -- 26 -- 24 -- -- --   GLU 189* -- 226* -- 174* -- -- --   BUN 20.5 -- 19.5 -- 19.6 -- -- --   CREAT 0.8 -- 0.8 -- 0.7 -- -- --   MG -- -- -- -- -- 1.7 1.6 1.6   PHOS -- -- -- -- -- -- 3.3 3.1   AST -- -- -- -- -- -- -- 20   ALT -- -- -- -- -- -- -- 30   ALKPHOS -- -- -- -- -- -- -- 86   BILITOTAL -- -- -- -- -- -- -- 0.5   BILIDIRECT -- -- -- -- -- -- -- --   LIP -- -- -- -- -- -- -- --   BNP -- -- -- -- -- -- -- --   PT 25.5* 23.0* -- -- 18.4* -- -- --   INR 2.4 2.1 -- -- 1.6 -- -- --   PTT -- 67* 138* 85* -- -- -- --   DDIMER -- -- -- -- -- -- -- --   CK -- -- -- -- -- -- -- --   CKMB -- -- -- -- -- -- -- --   TROPI -- -- -- -- -- -- -- --   MYOGLOBIN -- -- -- -- -- -- -- --           Rads:     Radiology Results (24 Hour)     ** No Results found for  the last 24 hours. **          Radiological Imaging personally reviewed.    I have personally reviewed the patient's history and 24 hour interval events, along with vitals, labs, radiology images and  ventilator settings and additional findings found in detail within ICU team notes, with their care plans developed with and reviewed by me.         Signed by: Durward Fortes, MD  Date/Time: 05/21/2013 7:32 PM

## 2013-05-22 LAB — CBC
Hematocrit: 34.6 % — ABNORMAL LOW (ref 42.0–52.0)
Hgb: 11.3 g/dL — ABNORMAL LOW (ref 13.0–17.0)
MCH: 27.9 pg — ABNORMAL LOW (ref 28.0–32.0)
MCHC: 32.7 g/dL (ref 32.0–36.0)
MCV: 85.4 fL (ref 80.0–100.0)
MPV: 10.9 fL (ref 9.4–12.3)
Platelets: 288 10*3/uL (ref 140–400)
RBC: 4.05 10*6/uL — ABNORMAL LOW (ref 4.70–6.00)
RDW: 18 % — ABNORMAL HIGH (ref 12–15)
WBC: 15.99 10*3/uL — ABNORMAL HIGH (ref 3.50–10.80)

## 2013-05-22 LAB — BASIC METABOLIC PANEL
Anion Gap: 14 (ref 5.0–15.0)
BUN: 32.3 mg/dL — ABNORMAL HIGH (ref 9.0–21.0)
CO2: 26 mEq/L (ref 22–29)
Calcium: 10.1 mg/dL (ref 8.5–10.5)
Chloride: 96 mEq/L — ABNORMAL LOW (ref 98–107)
Creatinine: 0.9 mg/dL (ref 0.7–1.3)
Glucose: 211 mg/dL — ABNORMAL HIGH (ref 70–100)
Potassium: 4.2 mEq/L (ref 3.5–5.1)
Sodium: 136 mEq/L (ref 136–145)

## 2013-05-22 LAB — GLUCOSE WHOLE BLOOD - POCT
Whole Blood Glucose POCT: 202 mg/dL — ABNORMAL HIGH (ref 70–100)
Whole Blood Glucose POCT: 209 mg/dL — ABNORMAL HIGH (ref 70–100)
Whole Blood Glucose POCT: 217 mg/dL — ABNORMAL HIGH (ref 70–100)
Whole Blood Glucose POCT: 211 mg/dL — ABNORMAL HIGH (ref 70–100)
Whole Blood Glucose POCT: 170 mg/dL — ABNORMAL HIGH (ref 70–100)

## 2013-05-22 LAB — PT/INR
PT INR: 3
PT: 30.7 s (ref 12.6–15.0)

## 2013-05-22 LAB — GFR: EGFR: >60

## 2013-05-22 MED ORDER — INSULIN ASPART 100 UNIT/ML SC SOLN
1.0000 [IU] | Freq: Four times a day (QID) | SUBCUTANEOUS | Status: DC
Start: 2013-05-22 — End: 2013-05-23
  Administered 2013-05-22: 2 [IU] via SUBCUTANEOUS
  Administered 2013-05-22: 4 [IU] via SUBCUTANEOUS
  Administered 2013-05-23 (×2): 2 [IU] via SUBCUTANEOUS
  Filled 2013-05-22: qty 40
  Filled 2013-05-22 (×3): qty 20

## 2013-05-22 MED ORDER — WARFARIN SODIUM 5 MG PO TABS
2.5000 mg | ORAL_TABLET | Freq: Every day | ORAL | Status: DC
Start: 2013-05-22 — End: 2013-05-23
  Administered 2013-05-22: 2.5 mg via ORAL
  Filled 2013-05-22: qty 1

## 2013-05-22 MED ORDER — SIMETHICONE 40 MG/0.6ML PO SUSP
20.0000 mg | Freq: Four times a day (QID) | ORAL | Status: DC | PRN
Start: 2013-05-22 — End: 2013-05-23
  Administered 2013-05-22: 20 mg via ORAL
  Filled 2013-05-22: qty 30

## 2013-05-22 NOTE — Plan of Care (Signed)
Problem: Pain  Goal: Patient's pain/discomfort is manageable  Outcome: Progressing  Intervention: Include patient/family/caregiver in decisions related to pain management  Pt experiencing gas pain. Mylicon given.   Intervention: Offer non-pharmocological pain management interventions  Rest was encouraged.       Problem: Potential for Compromised Skin Integrity  Goal: Skin integrity is maintained or improved  Assess and monitor skin integrity. Identify patients at risk for skin breakdown on admission and per policy. Collaborate with interdisciplinary team and initiate plans and interventions as needed.   Outcome: Progressing  Intervention: Relieve pressure to bony prominences  Pillow placed on chair to decrease pressure on bony prominences.   Intervention: Avoid shearing  Lift sheet placed under pt. Lift was used to transport pt.   Intervention: Encourage use of lotion/moisturizer on skin  Encouraged to use chap stick to keep lips moisturized.    Intervention: Monitor patient's hygiene practices  Oral and peri care done.   Intervention: Collaborate with Wound, Ostomy, and Continence Nurse  Mepliex changed on sacrum.       Comments:   Continue to monitor.

## 2013-05-22 NOTE — Progress Notes (Signed)
Daily Progress Note    Date Time: 05/22/2013 2:43 PM  Patient Name: Adam Simmons  Attending Physician: Lawernce Ion, MD  Room: IC05/IC05-A   Admit Date: 04/24/2013  LOS: 28 days        Assessment/Plan:   #Neuro: alert and oriented. passy muir valve on to help with communication. OOB with lift. PT done.    #Cardio: ST. BP stable. afebrile    #Resp: trach collar. Minimal secretions. Inner cannula changed    #GI: rounded/soft. BM x 2. Pt having gas pains. Mylicon given.     #Renal /Fluid, Electrolytes : foley d/c.     #Endo: accuchecks changed to AC/HS.     #Skin: Desitin applied to peri area.  Nystatin ointment ordered for peri area.     #Nutrition: TF stopped. Mechanical Soft with Nectar Thick Diet.     #Lines: TLC PICC.     #Prophylaxis:   GI Prophylaxis:  pepcid  VTE Prophylaxis: lovenox     Wife called and updated. Pt will be transferred on Thursday at 10am-per [place] CM.    Medications:   Scheduled Meds:  Current Facility-Administered Medications   Medication Dose Route Frequency   . albuterol-ipratropium  3 mL Nebulization Q4H SCH   . ALPRAZolam  0.5 mg Per G Tube QHS   . insulin aspart  1-12 Units Subcutaneous TID w/meals & HS   . insulin glargine  20 Units Subcutaneous Daily   . lactobacillus/streoptococcus  1 capsule Oral Daily   . nystatin   Topical BID   . pantoprazole  40 mg Oral Daily   . sodium chloride (PF)  10 mL Intracatheter Daily   . sucralfate  1 g Oral Q6H   . warfarin  2.5 mg Oral Daily at 1800   . [DISCONTINUED] insulin aspart  1-12 Units Subcutaneous Q4H   . [DISCONTINUED] warfarin  5 mg Oral Daily at 1800   . [DISCONTINUED] warfarin  7.5 mg Oral Daily at 1800         Continuous Infusions:        Data:    Vent Settings:    Vent Settings  Vent Mode: PRVC  FiO2: 28 %  Resp Rate (Set): 20   Vt (Set, mL): 500 mL  PIP Observed (cm H2O): 17 cm H2O  PEEP/EPAP: 5 cm H20  Pressure Support / IPAP: 10 cmH20  Mean Airway Pressure: 10 cmH20        Labs:   Labs (last 72 hours):  Recent Labs   Basename  05/22/13 0406 05/21/13 0422 05/20/13 0427    WBC 15.99* 13.00* 12.98*    HGB 11.3* 10.4* 10.0*    HCT 34.6* 32.2* 30.1*    LABPLAT -- -- --     Recent Labs   Basename 05/22/13 0406 05/21/13 0422 05/20/13 1050 05/20/13 0427 05/19/13 1612    PT 30.7* 25.5* 23.0* -- --    INR 3.0 2.4 2.1 -- --    PTT -- -- 67* 138* 85*    Recent Labs   Basename 05/22/13 0406 05/21/13 0422 05/20/13 0427    NA 136 137 135*    K 4.2 4.1 4.1    CL 96* 96* 95*    CO2 26 29 26     BUN 32.3* 20.5 19.5    CREAT 0.9 0.8 0.8    GLU 211* 189* 226*    CA 10.1 9.4 9.2    MG -- -- --    PHOS -- -- --

## 2013-05-22 NOTE — Discharge Summary (Signed)
[facility]- Critical Care Note      Discharge Summary      Date Time: 05/22/2013 3:39 PM  Patient Name: Adam Simmons  Attending Physician: Lawernce Ion, MD  Primary Care Physician: Christa See, MD  Location/Room: IC05/IC05-A       Chief Complaint / Primary Reason for ICU evaluation :      Chief Complaint   Patient presents with   . Shortness of Breath     Respiratory failure from Streptococcus Pneumonia.     History of Presenting Illness:   Adam Simmons is a 48 y.o. male h/o DM type 2, HTN who presents to the hospital with respiratory failure. Per ER report, patient had approx. 1 week history of shortness of breath and URI symptoms. He had worsening shortness of breath and required intubation. Patient arrived to ICU with high ventilatory settings. He was also hypotensive despite aggressive fluid resuscitation. He had documented fevers.     Hospital Course:     2/3 ARDS / PNA / Sepsis w/ resp failure- intubated, Flu test-neg   Resp c+s= gm + cocci , x2BC= gm + cocci resembling strep   2/6 [place] placed for worsening creatinine, 14 L positive   2/8 dialysis   2/10 failed SBT   2/11 tachypnic, readjusted ventilator tidal volume.   2/13 hemodialysis catheter removed, bronchoscopy with improved pulmonary toilet   2/15 pulmonary toilet, mucomyst, chest PT vest   2/17 fever 102F, cooling blanket   2/23 trach shiley #8, cuffed, peg   2/24 pressure support trial 10/5 x 2hrs tolerated   2/26 PS trial 10/5 x 4 hrs bid   2/28 trach collar in am    Antibiotic History  Vancomycin 2/3-04/25/2013, 2/21-2/23/2015  Zosyn 2/3-04/26/2013  Ertapenem 2/5-2/15/2015  Ceftriaxone 2/16-2/27/2015  Linezolid 2/18-2/22/2015  Levaquin 2/3-2/15/2015  Fluconazole 2/16-2/27/2015    Past Medical History:     Past Medical History   Diagnosis Date   . Hypertension    . Diabetes mellitus    . Kidney stones          Past Surgical History:     Past Surgical History   Procedure Date   . Kidney stone surgery    . Tracheostomy 05/14/2013      Procedure: TRACHEOSTOMY;  Surgeon: Alen Blew, MD;  Location: [facility];  Service: General;  Laterality: N/A;  PEG AND TRACH PLACEMENT(PT IN ICU)   . Insertion, peg 05/14/2013     Procedure: INSERTION, PEG;  Surgeon: Alen Blew, MD;  Location: [facility];  Service: General;  Laterality: N/A;       Family History:   No family history on file.    Social History:     History     Social History   . Marital Status: Married     Spouse Name: N/A     Number of Children: N/A   . Years of Education: N/A     Social History Main Topics   . Smoking status: Current Every Day Smoker     Types: Cigarettes   . Smokeless tobacco: Not on file      Comment: Cigars only- 5-10 cigars/day   . Alcohol Use: 3.6 oz/week     6 Cans of beer per week      Comment: 2 beers daily   . Drug Use: No   . Sexually Active: Yes -- Male partner(s)     Other Topics Concern   . Not on file  Social History Narrative   . No narrative on file       Allergies:   No Known Allergies        Medications:     Prescriptions prior to admission   Medication Sig   . metFORMIN (GLUCOPHAGE) 1000 MG tablet Take 1,000 mg by mouth 2 (two) times daily with meals.   . naproxen (NAPROSYN) 375 MG tablet Take 375 mg by mouth 2 (two) times daily with meals.   . ALPRAZolam (XANAX) 1 MG tablet Take 1 mg by mouth nightly as needed.   . fluconazole (DIFLUCAN) 100 MG tablet Take 100 mg by mouth daily.   . Potassium (POTASSIMIN PO) Take by mouth.       Current Inpatient :  Current Facility-Administered Medications   Medication Dose Route Frequency   . albuterol-ipratropium  3 mL Nebulization Q4H SCH   . ALPRAZolam  0.5 mg Per G Tube QHS   . insulin aspart  1-12 Units Subcutaneous TID w/meals & HS   . insulin glargine  20 Units Subcutaneous Daily   . lactobacillus/streoptococcus  1 capsule Oral Daily   . nystatin   Topical BID   . pantoprazole  40 mg Oral Daily   . sodium chloride (PF)  10 mL Intracatheter Daily   . sucralfate  1 g Oral Q6H    . warfarin  2.5 mg Oral Daily at 1800   . [DISCONTINUED] insulin aspart  1-12 Units Subcutaneous Q4H   . [DISCONTINUED] warfarin  5 mg Oral Daily at 1800       Home Medications :     Prior to Admission medications    Medication Sig Start Date End Date Taking? Authorizing Provider   metFORMIN (GLUCOPHAGE) 1000 MG tablet Take 1,000 mg by mouth 2 (two) times daily with meals.   Yes [provider]   naproxen (NAPROSYN) 375 MG tablet Take 375 mg by mouth 2 (two) times daily with meals.   Yes [provider]   ALPRAZolam Prudy Feeler) 1 MG tablet Take 1 mg by mouth nightly as needed.    [provider]   fluconazole (DIFLUCAN) 100 MG tablet Take 100 mg by mouth daily.    [provider]   Potassium (POTASSIMIN PO) Take by mouth.    [provider]          Physical Exam:     Filed Vitals:    05/22/13 1400   BP: 124/79   Pulse: 112   Temp: 96.4 F (35.8 C)   Resp: 21   SpO2: 99%       Intake and Output Summary (Last 24 hours) at Date Time    Intake/Output Summary (Last 24 hours) at 05/22/13 1539  Last data filed at 05/22/13 1200   Gross per 24 hour   Intake   1852 ml   Output   1250 ml   Net    602 ml     General Appearance: obese, able to slowly speak with passer muir valve, trached   Mental status: alert   Neuro: awake, diffusely weak but getting slowly stronger,trached, He responds and smiles at times.Now able to raise his legs up a few inches off the floor.   Neck: supple   Lungs: bilateral rhonchi anteriorly, no wheeze   Cardiac: ns1, ns2, no m/r/g, tachycardic   Abdomen: Obese, soft, nontender, hypoactive bowel sounds, peg site intact, no erythema   Extremities: no lower extremity edema   Skin: Right arm swelling  Labs:         Labs (last 72 hours):  Recent Labs   Basename 05/22/13 0406 05/21/13 0422 05/20/13 0427    WBC 15.99* 13.00* 12.98*    HGB 11.3* 10.4* 10.0*    HCT 34.6* 32.2* 30.1*    LABPLAT -- -- --     Recent Labs   Basename 05/22/13 0406 05/21/13 0422  05/20/13 1050 05/20/13 0427 05/19/13 1612    PT 30.7* 25.5* 23.0* -- --    INR 3.0 2.4 2.1 -- --    PTT -- -- 67* 138* 85*    Recent Labs   Basename 05/22/13 0406 05/21/13 0422 05/20/13 0427    NA 136 137 135*    K 4.2 4.1 4.1    CL 96* 96* 95*    CO2 26 29 26     BUN 32.3* 20.5 19.5    CREAT 0.9 0.8 0.8    GLU 211* 189* 226*    CA 10.1 9.4 9.2    MG -- -- --    PHOS -- -- --                   Radiology / Imaging:     CXR  05/14/2013  "IMPRESSION:   Status post interval pacemaker of a tracheostomy tube. Persistent   bibasilar consolidations."    Transthoracic echocardiogram  04/25/2013  "IMPRESSION:   1. Technically limited study.   2. The left ventricle is normal in size. Left ventricular systolic   function is likely low normal. Estimated EF is 50%. There is no left   ventricular hypertrophy.   3. The left atrium is normal in size.   4. There is no aortic insufficiency. No aortic stenosis is present. The   aortic root is normal in size.   5. No significant mitral insufficiency is present.   6. The right ventricle is normal in size and function.   7. The right atrium is normal in size.   8. There is no significant tricuspid insufficiency.   9. There is no significant pulmonic insufficiency.   10. There is no evidence of pulmonary hypertension.   11. No pericardial effusion, intracardiac thrombi, or masses are seen.   12. The atrial septum is structurally normal. No shunt by color flow   doppler.   CONCLUSIONS -   Technically somewhat limited study.   The left ventricular size with low-normal systolic function, EF 50%.   Normally functioning aortic, mitral, and tricuspid valves."    Other ICU Data   Vent Settings:    Vent Settings  Vent Mode: PRVC  FiO2: 28 %  Resp Rate (Set): 20   Vt (Set, mL): 500 mL  PIP Observed (cm H2O): 17 cm H2O  PEEP/EPAP: 5 cm H20  Pressure Support / IPAP: 10 cmH20  Mean Airway Pressure: 10 cmH20    Invasive ICU Hemodynamics:  Invasive Hemodynamic Monitoring  CVP (mmHg): 22 mmHg  CO (L/min):  10.4 L/min  CI (L/min/m2): 4.3 L/min/m2  Art Line  Arterial Line BP: 139/70 mmHg  Arterial Line MAP (mmHg): 89 mmHg  Arterial Line Location: Left radial  Art Line Wave Form: Appropriate wave forms       Line, Drains,Airway :       PICC Triple Lumen 05/04/13 Right Basilic (Active)   Line necessity reviewed? Yes 05/22/2013  2:00 PM   Site Assessment Clean;Dry;Intact 05/22/2013  2:00 PM   Proximal Lumen Status Flushed;Capped 05/22/2013  2:00 PM   Medial Lumen Status  Capped;Flushed 05/22/2013  2:00 PM   Distal Lumen Status Capped;Flushed 05/22/2013  2:00 PM   Dressing Type Transparent 05/22/2013  2:00 PM   Dressing Status Clean;Dry;Intact 05/22/2013  2:00 PM   Dressing Intervention Dressing changed 05/19/2013  4:30 PM   Biopatch Used? Yes 05/22/2013  2:00 PM   End Caps Free From Blood Yes 05/22/2013  6:00 AM   Line Used For Blood Draw Yes 05/22/2013  6:00 AM   Dressing Change Due 05/26/13 05/22/2013  2:00 PM   Number of days:18       Arterial Line 04/24/13 Left Radial (Removed)   Removed 05/02/13 1800   Site Assessment Clean;Dry;Intact 05/02/2013  6:00 PM   Line necessity reviewed? Yes 05/02/2013  6:00 PM   Line Status Pulsatile blood flow 05/02/2013  6:00 PM   Art Line Waveform Appropriate 05/02/2013  6:00 PM   Art Line Interventions Zeroed and calibrated 05/02/2013  8:00 AM   Color/Movement/Sensation Warm fingers/toes;Pink fingers/toes;Capillary refill less than 3 sec 05/02/2013  6:00 PM   Dressing Type Transparent 05/02/2013  6:00 PM   Dressing Status Clean;Dry;Intact 05/02/2013  6:00 PM   Dressing Intervention Dressing changed 04/30/2013  8:00 PM   Dressing Change Due 05/07/13 05/02/2013  6:00 PM   Number of days:8       CVC Triple Lumen 04/24/13 Left Internal jugular (Removed)   Removed 05/04/13 1400   Line necessity reviewed? Yes 05/04/2013 10:00 AM   Site Assessment Clean;Dry;Intact 05/04/2013 10:00 AM   Proximal Lumen Status Infusing 05/04/2013 10:00 AM   Medial Lumen Status Capped;Clotted 05/04/2013 10:00 AM   Distal Lumen Status Infusing 05/04/2013  10:00 AM   Dressing Type Transparent 05/04/2013 10:00 AM   Dressing Status Clean;Dry;Intact 05/04/2013 10:00 AM   Dressing Intervention Dressing changed 04/30/2013  8:00 PM   Biopatch Used? Yes 05/04/2013 10:00 AM   End Caps Free From Blood Yes 05/04/2013 10:00 AM   Line Used For Blood Draw Yes 05/04/2013 10:00 AM   Dressing Change Due 05/07/13 05/04/2013  8:00 AM   Number of days:10       Permacath/Temporary Catheter 04/27/13 Temporary Catheter Right (Removed)   Removed 05/04/13 1340   Line necessity reviewed? Yes 05/04/2013 10:00 AM   Dressing Status and Intervention Dressing Intact 05/03/2013  2:00 PM   Tego Caps on Catheter Yes 05/01/2013  8:00 AM   NEW Tego Caps placed (Date) 04/29/13 05/01/2013  8:00 AM   End Caps Free From Blood Yes 05/04/2013 10:00 AM   Line Care Connections checked and tightened 04/29/2013  9:57 AM   Dressing Type Transparent 05/04/2013 10:00 AM   Dressing Status Clean;Dry;Intact 05/04/2013 10:00 AM   Dressing Intervention Dressing changed 05/03/2013  4:00 AM   Biopatch Used? Yes 05/04/2013 10:00 AM   Line Used For Blood Draw No 05/04/2013 10:00 AM   Dressing Change Due 05/10/13 05/04/2013  8:00 AM   Number of days:7       Assessment/Plan:     # Neurologic: alert. Critical care myopathy. Diffuse generalized weakness that is slowly improving.     # Respiratory: multilobar Strep Pneumonia pneumonia, resolved ARDS.. S/p trach placement. Awaiting placement. Pt will benefit from vent weaning facility (LTAC). Requires vent at night.     # Nutrition: appreciate nutritionist recommendation, patient passed swallow evaluation today for soft mechanical diet.  As patient improves in swallowing, can stop tube feeds.     #GI: gastritis, cont protonix    # Endocrine: Diadbetes; Cont lantus and SSI     #  Cardiac: Echo indicated EF 50%     #renal: acute kidney injury stable, appreciate Dr. Dalbert Batman group recs.     # Infectious diseases: streptococcus pneumonia bacteremia/ pneumonia. Appreciate Dr. Myrtis Ser consultation and  evaluation. Finished full course of antibiotics. Off abx since 05/21/2013.    #Right arm DVT: therapeutic INR. Given quick increase of INR 3.0. Will decrease coumadin 2.5 mg qhs.     Code Status: full code     I have personally reviewed the patient's history and 24 hour interval events, along with vitals, labs, radiology images, and nurses report.     I have personally reviewed the patient's history and 24 hour interval events, along with vitals, labs, radiology images, and nurses report.     Discharge Medications:   Please see inpatient medication list      This is a tentative discharge summary in anticipation for transfer to [facility] facility.  Addendum will be added as needed.     Signed by: Lawernce Ion  cc:Pcp, Noneorunknown, MD

## 2013-05-22 NOTE — Progress Notes (Signed)
Patsy Lager, the nurse coordinator, with the [facility] facility [phone]) called and told the CM that she will have a bed available on Thursday 05/24/13 and patient should leave the hospital on later than 10:00 am due to long distance.  Patsy Lager will call the CM on Wednesday to give room and report number.  Dr. Nedra Hai will do the discharge summary.      CM called patient's wife, Adam Simmons, at [phone] and notified her about the discharge date.  She is happy and agrees with the discharge plan.  She told the CM that patient can leave the hospital for [facility] on Thursday and she will meet the patient in the [place] facility when he reaches there.  CM will f/u as needed.

## 2013-05-22 NOTE — Progress Notes (Signed)
Katherine Basset, the nurse, with the [facility] [phone]) called and told the CM that she will approve LTACH facility for this patient to go for further treatement.  She is willing to approve [facility] (in the network) as well as [facility] [place] (out of network).  She will talk to the facility in [place] and approve patient as in the net work benefits.  CM discussed this information with patient and he preferred CM to talk to his wife.        CM called pt's wife, Judeth Cornfield, at [phone] and gave her this information and she selected patient to go to the [facility] facility in [place].  She verbalized that she does not want any surprises later if patient goes to the out of network facility in [place], even though, insurance will approve as in network benefits at this time.  This information was given to patient and he agrees with his wife.        CM called Orlie Pollen, the nurse, with the [employer] [place] and told her that patient wife considers to go to the [place] facility, in [place].  Larita Fife will call Patsy Lager with the [place] facility and give her the authorization number for [facility] rehab services.  Auth number is [id number].        No approval needed for transportation as per [place] with [employer] [place].      CM called and talked to [facility] and she confirmed that she got auth from [place], but she does not have a bed available today.  Patsy Lager or Candace from [place] facility (TEL [phone]) will call the CM for bed availability for tomorrow.      CM notified doctor Nedra Hai that insurance has approved patient to go to the [facility] facility and she will do the discharge summary as required for transfer.   CM will f/u as needed.

## 2013-05-22 NOTE — PT Progress Note (Signed)
[facility]  [address]  [place], [place]. [address]    Department of Rehabilitation  [phone]    Physical Therapy Daily Treatment Note    Patient: Adam Simmons    MRN#: [id number]     Time of Treatment: Start Time: 1424 Stop Time: 1455 Time Calculation (min): 31 min    PT Visit Number: 10    Patient's medical condition is appropriate for Physical Therapy intervention at this time.    Precautions and Contraindications: Fall risk       Assessment: Pt with much improved balance and trunk control during session today. Min A to sit forward and able to sit with supervision and UE support x2-3 min. Decreased endurance, quickly fatigued. Nausea with mobility. Pt is making good progress but goals remain appropriate.  Assessment: Decreased UE strength;Decreased LE strength;Decreased endurance/activity tolerance;Decreased functional mobility Prognosis: Good;With continued PT status post acute discharge   Progress: Progressing toward goals     Patient Goal: To get stronger      Plan:   Continue with Physical Therapy services to address weakness, mobility deficits. Focus next session on sitting balance, transfers.  Treatment/Interventions: Neuromuscular re-education;Functional transfer training;LE strengthening/ROM;Endurance training;Bed mobility   PT Frequency: 4-5x/wk     Discharge Recommendation:  (LTAC vs acute )      Subjective: Patient is agreeable to participation in the therapy session. Nursing clears patient for therapy. No c/o pain currently. Pt with speaking valve on, able to communicate well with PT. No SOB noted with phonation.         Objective:  Observation of Patient/Vital Signs:  Patient is seated in a bedside chair with telemetry, PEG, PICC line and indwelling urinary catheter in place.    Cognition  Arousal/Alertness: Appropriate responses to stimuli  Attention Span: Appears intact  Following Commands: Follows all commands and directions without difficulty  Safety Awareness: minimal verbal  instruction  Insights: Fully aware of deficits    Therapeutic Exercise  Knee AROM : LAQ Bx15  Ankle Pumps: Bx15  Elbow flex/ext Bx10, AAROM  Shoulder flex/ext Bx5, AAROM  Pt able to perform active scap retractions, shoulder shrugs.       Neuro Re-Ed  Sitting Balance: supervision (Min A to bring shoulders forward off backrest of chair. Able to maintain sitting balance with elbow propping and UE in lap x2-3 min for 2 trials. No SOB noted but pt reported fatigue with short trials)     Treatment Activities: Mobility and therex as above. Pt became nauseous with second trial sitting upright and frequent coughing. Vomited multiple times. RN made aware. Assisted pt with gown change, washing face. Hand over hand assistance to bring washcloth to face. Pt unable to grasp tissue to blow nose or cup to drink. Assistance from PT for all. Continues with weakness but slowly improving. Trunk control much improved from yesterday's session.    Educated the patient to role of physical therapy, plan of care, goals of therapy and HEP, safety with mobility and ADLs.    At end of session pt reclined in chair, call bell and items in reach. RN aware. All medical equipment intact.      Delfin Edis, PT, DPT  Pager #: [phone]

## 2013-05-22 NOTE — Progress Notes (Signed)
Infectious Disease            Progress Note    05/22/2013   Adam Simmons ZOX:[id number] is a 48 y.o. male, history of hypertension, diabetes mellitus, kidney stones. Admitted with septic shock, pneumonia, respiratory failure.    Subjective:     Adam Simmons today Symptoms: Afebrile, worsening leukocytosis. No new complaints.    Objective:     Blood pressure 115/86, pulse 99, temperature 96 F (35.6 C), temperature source Temporal Artery, resp. rate 22, height 1.727 m (5\' 8" ), weight 110.8 kg (244 lb 4.3 oz), SpO2 100.00%.    General Appearance: Awake, responsive  HEENT: Pallor negative, Anicteric sclera.  status post tracheostomy; on trach collar  Lungs:  Bilateral decreased breath sounds  Heart: Normal rate.  S1 and S2.    Chest Wall: Symmetric chest wall expansion.   Abdomen: Abdomen is soft, scaphoid and non-distended. There are no signs of ascites. Bowel sounds are normal. G-tube in place   Neurological: Patient is awake, responsive    Laboratory And Diagnostic Studies:     Recent Labs   [facility] 05/22/13 0406 05/21/13 0422    WBC 15.99* 13.00*    HGB 11.3* 10.4*    HCT 34.6* 32.2*    PLT 288 302    NEUTRO -- --     Recent Labs   [facility] 05/22/13 0406 05/21/13 0422    NA 136 137    K 4.2 4.1    CL 96* 96*    CO2 26 29    BUN 32.3* 20.5    CREAT 0.9 0.8    GLU 211* 189*    CA 10.1 9.4         Current Med's:     Current Facility-Administered Medications   Medication Dose Route Frequency   . albuterol-ipratropium  3 mL Nebulization Q4H SCH   . ALPRAZolam  0.5 mg Per G Tube QHS   . insulin aspart  1-12 Units Subcutaneous Q4H   . insulin glargine  20 Units Subcutaneous Daily   . lactobacillus/streoptococcus  1 capsule Oral Daily   . nystatin   Topical BID   . pantoprazole  40 mg Oral Daily   . sodium chloride (PF)  10 mL Intracatheter Daily   . sucralfate  1 g Oral Q6H   . warfarin  5 mg Oral Daily at 1800   . [DISCONTINUED] warfarin  7.5 mg Oral Daily at 1800             Assessment:       Condition.  Guarded   Septic shock; resolved   Strep pneumoniae sepsis   Coagulase-negative staph bacteremia     Respiratory failure, status post tracheostomy   Bilateral pneumonia   Possible aspiration   Diabetes mellitus   Acute kidney injury; resolved   Diarrhea; C. Difficile negative    DVT right extremity    Plan:      Will monitor without antibiotics   Continue supportive care   Nephrology follow up   Physical therapy   Discussed with Dr. Roxan Simmons, M.D.,FACP  05/22/2013  8:19 AM

## 2013-05-22 NOTE — SLP Progress Note (Signed)
Speech Language Pathology    [facility]  [address]  [place], [place]. [address]    Department of Rehabilitation Services  [phone]    Speech Language and Pathology Therapy Treatment Note       Patient:  Adam Simmons MRN#:  [id number]  INTENSIVE CARE [place] IC05/IC05-A    Time of treatment:  2841-3244  Treatment # 2    Date of Initial Evaluations: 05/19/13  Initial Evaluation Recommendations: trials of PO    Precautions and Contraindications:  Asp- posted at bedside    Assessment:   1. Pt with emerging functional oral communication skills via PMSV  2. Pt with adequate oropharyngeal phase skills for a mech soft diet with nectar thickened liquids    Plan:   1. Initiate mech soft diet and nectar thickened liquids  2. Cont using PMSV throughout the day  3. Inpatient acute rehab as appropriate      Risks/benefits/POC discussed with pt and RN      Goals:   Time For Goal Achievement: by time of discharge   1 Patient will demonstrate adequate oropharyngeal phase skills for a mechanical soft diet with nectar thickened liquids, without s/s of aspiration,  X1-2 sessions.  2.Pt will demonstrate adequate oropharyngeal phase ksills for trials of thin liquids without s/s of aspiration X3  3. Pt will demonstrate the ability to maintain all vitals for 30-45 trials of PMSV  4. Pt will produce clear, audible vocal quality for words/phrases with 95%, X3                        Diet Recommendations:   Mech soft solids and nectar thickened liquids      Subjective:   Patient's medical condition is appropriate for Speech Language Pathology Therapy intervention at this time.  Patient is agreeable to participation in the therapy session.    Objective:   Observation of Patient/Vital Signs:  Patient is seated in a bedside chair with peripheral IV, trach (#8 Shiley/fenestrated with cuff fully deflated)    Pt seen for oral communication trials with Passy Muir Speaking Valve (PMSV):    Pre PMSV placement-pt with cuff fully  deflated, RR=20, Sp02=98-100; NAD    Post PMSV placement- no change in vitals; NAD throughout trial; pt able to produce 1-2 words per breath without appreciable WOB; vocal quality clear and audible throughout session, with improvements noted as session progressed    Length of PMSV trial- 30+ minutes    Vocal Quality- clear/breathy though improved with time; RN alerted that PMSV left in place at end of the trial    Pt seen for continued dysphagia dx/tx:  He was presented with the following items- crackers, pureed solids, jello, and nectar thickened liquids    Oral Phase- Adequate AP transit of both solids and liquids; pt also with adequate mastication of solids; no oral residual with either solids or liquids; no anterior bolus loss    Pharyngeal Phase-Timely and coordinated swallow response for both liquids and solids; pt with adequate laryngeal elevation; clear breath sounds and vocal quality pre and post swallow, to cervical auscultation; no cough or choke with solids or liquids; vitals remained unchanged  Pt suctioned 3-4 times throughout the session; secretions remained clear; NO S/S of aspiration with solids or liquids.      Educated the patient to role of Speech therapy, plan of care, and goals of therapy.    Family and patient education includes written and verbal information  RN  alerted to results of session and the fact that valve left in place    Patient left without needs and call bell within reach. RN notified of session outcome.    Doralee Albino. Margo Aye, MS CCC-SLP

## 2013-05-22 NOTE — Plan of Care (Signed)
Problem: Potential for Compromised Skin Integrity  Goal: Skin integrity is maintained or improved  Assess and monitor skin integrity. Identify patients at risk for skin breakdown on admission and per policy. Collaborate with interdisciplinary team and initiate plans and interventions as needed.   Outcome: Progressing  Intervention: Monitor/assess Braden Scale every shift  Braden scale assessed and appropriate interventions implemented.  Intervention: Turn patient  Patient turned q2hours with pillow support.      Problem: Tissue integrity  Goal: Nutritional Status Improving  Outcome: Progressing  Intervention: Consult/Collaborate with Speech Pathologist for dysphagia  Speech therapy consulted for swallow with tracheostomy.  Intervention: Initiate NG as ordered. Verify placement with KUB  Patient being fed through PEG.  Intervention: Consult/Collaborate with Clinical Nutritionist  Nutritionist consulted and providing recommendations to physician.  Orders given.      Problem: Inadequate airway clearance  Goal: Patent Airway maintained  Outcome: Progressing  Intervention: Position for maximum ventilatory efficiency.  Patient HOB 30 degrees  Intervention: Suction secretions as needed  Patient with good strong productive cough.  Able to mobilize secretions up through tracheostomy.

## 2013-05-22 NOTE — Progress Notes (Signed)
Nutritional Support Services  Nutrition Follow Up    Sadao Weyer 48 y.o. male   MRN: [id number]    Nutrition Summary/Diet history:  Pt with trach and peg. Passed swallow evaluation for mechanical soft liquid with nectar thick liquids. RN reports that tube feeds were put on hold this morning due to pt feeling bloated and nauseous.     Nutrition Diagnosis:   Swallowing difficulty related to difficulty weaning off vent and now with trach and peg as evidenced by swallow evaluation.    Intervention:  1. Allow pt to take po as tolerated.  2.   Recommend to change tube feeds to bolus with 480 ml of 1.2 Glucerna (or 2 cans) TID with 1 pack Prosource per feed. Recommend to flush tube with 60 ml after each feed.  3.   If pt is eating well he does not need to bolus feed for that meal. If he has a poor appetite will need to supplement with bolus tube feeds. This should be plan for discharge.     Goal: Meet estimated needs with enteral nutrition if unable to tolerate po intake.    Monitoring:   Evaluation:   1. Residuals < 500 mL < 0 mL  2. Weights   - 10 kg since admission  3. Net I&O   + 10.3 L since admission    Nutrition risk level: Moderate    Assessment Data:  Adm dx:  Respiratory failure   Patient Active Problem List   Diagnosis   . Respiratory failure   . Pneumonia   . Hypokalemia   . ARDS (adult respiratory distress syndrome)   . DM (diabetes mellitus)   . AKI (acute kidney injury)   . Pneumococcal sepsis   . Arm DVT (deep venous thromboembolism), acute   PMH:  has a past medical history of Hypertension; Diabetes mellitus; and Kidney stones.  PSH:  has past surgical history that includes Kidney stone surgery; TRACHEOSTOMY (05/14/2013); and INSERTION, PEG (05/14/2013).  Pertinent labs:     Lab 05/22/13 0406 05/21/13 0422 05/20/13 8119 05/19/13 0408 05/18/13 0413 05/17/13 0629 05/16/13 0418   NA 136 137 135* 136 136 -- --   K 4.2 4.1 4.1 3.9 3.8 -- --   CL 96* 96* 95* 98 101 -- --   CO2 26 29 26 24 26  -- --   BUN 32.3* 20.5  19.5 19.6 18.1 -- --   CREAT 0.9 0.8 0.8 0.7 0.7 -- --   GLU 211* 189* 226* 174* 208* -- --   CA 10.1 9.4 9.2 8.9 8.6 -- --   MG -- -- -- -- -- 1.7 1.6   PHOS -- -- -- -- -- -- 3.3   EGFR >60.0 >60.0 >60.0 >60.0 >60.0 -- --   Blood glucose range (48 hrs) 166-209 mg/dL   Pertinent meds: lactobacillus/streoptococcus, insulin, pantoprazole, sucralfate, warfarin  Social history:  married  Food Intolerance/Religious/Ethnic preferences: none noted  Diet Order:  Orders Placed This Encounter   Procedures   . Diet Mechanical Soft Liquid consistency: NECTAR   . Prosource no carb Frequency:: TID with meals; Quantity:: A. One   . Tube feeding-CONTINUOUS   Enteral nutrition:   Indication: vented  Route: PEG  Frequency: continuous   Formula: Glucerna 1.2   Add Rate: 60 mL/hr   Formula provides: 1728 kcals, 86.4 g pro and 1159 mL free water  Additives: ProSource NoCarb 3 packet per day = 180 kcals and 45 g pro/d. (TF+ ProSource = 1908 kcals and  131 g pro/d)   Water flushes: 150 mL q 4 hrs  Residuals: < 0 mL   GI symptoms: abd distended, bloating, constipation  Hydration: non pitting edema, 1/2 NS @ 65 mL/hr  Skin: Stage II sacrum, II buttock, abrasions  Anthropometrics  Height: 172.7 cm (5\' 8" )  Weight: 110.8 kg (244 lb 4.3 oz)  Weight Change: -3.65   IBW/kg (Calculated) Male: 70.02 kg  IBW/kg (Calculated) Male: 63.62 kg  BMI (calculated): 42.6   Wt Readings from Last 30 Encounters:   05/20/13 110.8 kg (244 lb 4.3 oz)   05/20/13 110.8 kg (244 lb 4.3 oz)   05/20/13 110.8 kg (244 lb 4.3 oz)   05/20/13 110.8 kg (244 lb 4.3 oz)   Learning Needs: no  Estimated Needs:  Estimated Energy Needs  Total Energy Estimated Needs: 1890-2100 kcals/d  Method for Estimating Needs: 27-30 kcals/kg IBW  Estimated Protein Needs  Total Protein Estimated Needs: 91-105 g pro/d  Method for Estimating Needs: 1.3-1.5 g pro/kg IBW  Fluid Needs  Method for Estimating Needs: per MD  No Known Allergies    Caprice Renshaw, MS, RD  Ext. [phone]

## 2013-05-22 NOTE — Progress Notes (Signed)
0500 Patient c/o nausea with dry heaves.  Tube feed stopped. Zofran IV given as ordered.  Patient with frequent belching.  Aspirated air from stomach via PEG.  Patient feeling more comfortable.  Will hold feeds at this time.

## 2013-05-23 LAB — CBC
Hematocrit: 31.2 % — ABNORMAL LOW (ref 42.0–52.0)
Hgb: 10.2 g/dL — ABNORMAL LOW (ref 13.0–17.0)
MCH: 27.6 pg — ABNORMAL LOW (ref 28.0–32.0)
MCHC: 32.7 g/dL (ref 32.0–36.0)
MCV: 84.3 fL (ref 80.0–100.0)
MPV: 11.2 fL (ref 9.4–12.3)
Platelets: 264 10*3/uL (ref 140–400)
RBC: 3.7 10*6/uL — ABNORMAL LOW (ref 4.70–6.00)
RDW: 18 % — ABNORMAL HIGH (ref 12–15)
WBC: 14.73 10*3/uL — ABNORMAL HIGH (ref 3.50–10.80)

## 2013-05-23 LAB — BASIC METABOLIC PANEL
Anion Gap: 12 (ref 5.0–15.0)
BUN: 32.4 mg/dL — ABNORMAL HIGH (ref 9.0–21.0)
CO2: 28 mEq/L (ref 22–29)
Calcium: 9.7 mg/dL (ref 8.5–10.5)
Chloride: 94 mEq/L — ABNORMAL LOW (ref 98–107)
Creatinine: 0.9 mg/dL (ref 0.7–1.3)
Glucose: 163 mg/dL — ABNORMAL HIGH (ref 70–100)
Potassium: 3.9 mEq/L (ref 3.5–5.1)
Sodium: 134 mEq/L — ABNORMAL LOW (ref 136–145)

## 2013-05-23 LAB — GLUCOSE WHOLE BLOOD - POCT
Whole Blood Glucose POCT: 162 mg/dL — ABNORMAL HIGH (ref 70–100)
Whole Blood Glucose POCT: 182 mg/dL — ABNORMAL HIGH (ref 70–100)

## 2013-05-23 LAB — GFR: EGFR: >60

## 2013-05-23 LAB — PT/INR
PT INR: 4.7
PT: 43.1 s (ref 12.6–15.0)

## 2013-05-23 LAB — MAGNESIUM: Magnesium: 1.9 mg/dL (ref 1.6–2.6)

## 2013-05-23 MED ORDER — ACETAMINOPHEN 160 MG/5ML PO SOLN
500.0000 mg | ORAL | Status: AC | PRN
Start: 2013-05-23 — End: ?

## 2013-05-23 MED ORDER — PANTOPRAZOLE ORAL SUSPENSION 40 MG/20 ML UD
40.0000 mg | Freq: Every day | ORAL | Status: AC
Start: 2013-05-23 — End: ?

## 2013-05-23 MED ORDER — MORPHINE SULFATE 10 MG/5ML PO SOLN
2.5000 mg | ORAL | Status: DC | PRN
Start: 2013-05-23 — End: 2017-07-07

## 2013-05-23 MED ORDER — INSULIN ASPART 100 UNIT/ML SC SOLN
1.0000 [IU] | Freq: Four times a day (QID) | SUBCUTANEOUS | Status: AC
Start: 2013-05-23 — End: ?

## 2013-05-23 MED ORDER — ZINC OXIDE 40 % EX PSTE
PASTE | CUTANEOUS | Status: AC | PRN
Start: 2013-05-23 — End: ?

## 2013-05-23 MED ORDER — INSULIN GLARGINE 100 UNIT/ML SC SOLN
20.0000 [IU] | Freq: Every day | SUBCUTANEOUS | Status: AC
Start: 2013-05-23 — End: ?

## 2013-05-23 MED ORDER — ALBUTEROL-IPRATROPIUM 2.5-0.5 (3) MG/3ML IN SOLN
3.0000 mL | Freq: Four times a day (QID) | RESPIRATORY_TRACT | Status: AC | PRN
Start: 2013-05-23 — End: ?

## 2013-05-23 MED ORDER — NYSTATIN 100000 UNIT/GM EX OINT
TOPICAL_OINTMENT | Freq: Two times a day (BID) | CUTANEOUS | Status: AC
Start: 2013-05-23 — End: ?

## 2013-05-23 MED ORDER — RISAQUAD PO CAPS
1.0000 | ORAL_CAPSULE | Freq: Every day | ORAL | Status: AC
Start: 2013-05-23 — End: ?

## 2013-05-23 NOTE — Progress Notes (Incomplete)
Case Management Facility [place] Checklist    Name of Receiving Facility and Bed #:   [facility] in [place], [place].    Room number 10     Number for Floor RN to Call Report: Contact Information Provided: Yes    [phone]     Name and number of discharge nurse and time nurse notified: Julian Hy at 10:00 am    Family Member notified of transfer plan:    Wife, Carver Fila, at [phone] at home in [facility]   Fax number of Receiving Facility: [phone]   Hard Copy of any narcotic prescriptions in envelope to travel with the patient:    Nurse to check and add in the envelope   Hard Copy of Durable DNR and/or advanced directive in envelope to travel with the patient:    n/a   Phone number and name of attending physician at receiving facility (MD to MD handoff): Dr. Gretta Cool [phone]           Transportation  Mode and Time of Transportation: Mode of Transportation: Ambulance    PTS (ALS) transport and pick up time is 13:30 today       SNF Only  I have completed the Medicaid Pre- Screening ([place], DMAS 96 & 97) and faxed it via ECIN/Allscripts to the receiving facility.       I have completed the DMAS 95 - [place]/MR form and faxed it via ECIN/Allscripts to the receiving facility.       Does the DMAS 95 - [place]/MR trigger a Level 2 screening?         Medicare Only  I have validated that this patient has a 3 midnight inpatient qualifying stay (SNF only).       I have validated that the patient has received the second Important Message from Medicare (IMM) letter.         The following was routed via Epic to the Receiving Facility:  Document Routed via Epic on Day of Discharge?   History & Physical yes   Discharge Medication Reconciliation (.DCMEDLIST in note) yes   D/C order yes   Last day of PT/OT notes (if applicable) yes   D/C Summary yes   PICC Line Report (if applicable) yes   Most Recent Chest Xray yes   All MD Consults yes   Isolation Requirement (go to isolation order in other orders section of chart  review) n/a   Urine/Wound/Blood Culture Results  yes   Discharge Instructions from AVS (use Pasteboard option to copy & paste from AVS into a progress note) yes   Tube Feeding or TPN Order (rate & formula) if applicable n/a   Last 3 days of MD Progress Notes yes   Ce

## 2013-05-23 NOTE — SLP Progress Note (Addendum)
Speech Language Pathology    [facility]   [address]   [place], [place]. [address]   Department of Rehabilitation Services   [phone]   Speech Language and Pathology Therapy Treatment Note     Patient: Adam Simmons MRN#: [id number]   INTENSIVE CARE [place] IC05/IC05-A   Time of treatment: 1000-1030   Treatment # 3     Date of Initial Evaluations: 05/19/13     Initial Evaluation Recommendations: trials of PO     Precautions and Contraindications: Asp- posted at bedside     Assessment:    1. Pt with daily improvement relative to speech production skills with PMSV- functional for conversation   2. Pt with adequate oropharyngeal phase skills for a mech soft with nectar thickened liquids   3. Patient MAY have sips of thin liquids in between meals    Plan:    1. Cont mech soft diet and nectar thickened liquids   2. Cont using PMSV throughout the day   3. Inpatient acute rehab as appropriate   4. SLP to upgrade diet to thin liquids over the next 24 hours, if no s/s of aspiration with SLP or RN  5. SLP to complete formal cog/ling eval over next 1-2 sessions- pt reported he is near baseline    Risks/benefits/POC discussed with pt and RN     Goals:   Time For Goal Achievement: by time of discharge   1 Patient will demonstrate adequate oropharyngeal phase skills for a mechanical soft diet with nectar thickened liquids, without s/s of aspiration, X1-2 sessions. (NEARLY MET)  2.Pt will demonstrate adequate oropharyngeal phase ksills for trials of thin liquids without s/s of aspiration X3(ONGOING)   3. Pt will demonstrate the ability to maintain all vitals for 30-45 trials of PMSV (MET)  4. Pt will produce clear, audible vocal quality for words/phrases with 95%, X3 (MET)              Diet Recommendations:    Mech soft solids and nectar thickened liquids   Pt may also have thin liquids in between meals    Subjective:    Patient's medical condition is appropriate for Speech Language Pathology Therapy  intervention at this time. Patient is agreeable to participation in the therapy session.     Objective:    Observation of Patient/Vital Signs: Patient is seated in a bedside chair with peripheral IV, trach (#8 Shiley/fenestrated with cuff fully deflated)   Pt seen for oral communication trials with Passy Muir Speaking Valve (PMSV):   Pre PMSV placement-pt with cuff fully deflated, RR=20, Sp02=98-100; NAD   Post PMSV placement- no change in vitals; NAD throughout trial; pt able to produce 1-2 words per breath without appreciable WOB; vocal quality clear and audible throughout session, with improvements noted as session progressed   Length of PMSV trial- 60+ minutes - on when clinician arrived and after leaving per pt request  Vocal Quality- clear for phrases and 3-5 word sentences; pt benefited from cues to increase breath support for phonation; RN alerted that PMSV left in place at end of the trial     Pt seen for continued dysphagia dx/tx:   He was presented with the following items- crackers, pureed solids, thin and thickened liquids  Oral Phase- WFL  Pharyngeal Phase-Timely and coordinated swallow response for both liquids and solids; pt with adequate laryngeal elevation; clear breath sounds and vocal quality pre and post swallow, to cervical auscultation; no cough or choke with solids or liquids;  vitals remained unchanged   Pt suctioned 1-2 times throughout the session; secretions remained clear; NO S/S of aspiration with solids or liquids.     Educated the patient to role of Speech therapy, plan of care, and goals of therapy-ongoing  Family and patient education includes written and verbal information   RN alerted to results of session and the fact that valve left in place   Patient left without needs and call bell within reach. RN notified of session outcome.     Doralee Albino. Margo Aye, MS CCC-SLP

## 2013-05-23 NOTE — Progress Notes (Signed)
Patient discharged to [facility], [place].  Report given to Osvaldo Human, at [facility], report included lab results, INR of 4.7 on coumadin, due to thrombus right arm.  Off ventilator, on trach collar with room air, BS clear, with some coarseness, SATS 99%. No complaints of pain, patient is alert, oriented, with speech evaluation done which allows for nectar thickened liquids, mechanical soft diet which patient is accepting well with assistance.  Voiding without difficulty, loose stool small, no diarrhea. Sacrum cleansed, and new mepilex applied.  Spoke with Britta Mccreedy, wound nurse, sacrum appears light purple intact skin healed stage II, and healed incontinence acquired dermatitis.  PTS given full report for transport, consent signed, wife called and informed of her husbands transfer this afternoon.

## 2013-05-23 NOTE — PT Progress Note (Signed)
[facility]  [address]  [place], [place]. [address]    Department of Rehabilitation  [phone]    Physical Therapy Daily Treatment Note    Patient: Adam Simmons    MRN#: [id number]   Unit: IC05    Time of Treatment: Start Time: 1200 Stop Time: 1225   Time Calculation: 25 min  PT Visit Number: 11    Patient's medical condition is appropriate for Physical Therapy intervention at this time.   Precautions: trach/peg    Assessment:  Pt continues to demonstrate both significant weakness and decreased functional endurance but slowly improving each day.  Appears very motivated with ex's.   Pt will benefit from continued in-patient PT to address the following deficits: Decreased LE and UE strength and ROM; Decreased endurance/activity tolerance;Impaired motor control;Impaired coordination;Decreased functional mobility  Prognosis: Good;With continued PT status post acute discharge   Progress: Progressing toward goals     Plan:  Continue with Physical Therapy services to address functional mobility, endurance, and strength deficits.  Focus next session on: Neuromuscular re-education;Functional transfer training; LE strengthening and ROM execises;Endurance training;Bed mobility   PT Frequency: 4-5x/wk     Discharge Recommendation: LTAC vs acute        Subjective: Patient is agreeable to participation in the therapy session, states he is trying to move arms and legs as much as he can t/o the day.  Nursing clears patient for therapy, defer getting pt up to chair due to possible transfer to LTAC today.   Pain Assessment: No/denies pain, reminded therapist he has a bad L ankle, R knee and L shoulder.     Objective:  Observation of patient vital signs performed by nursing staff, see flow chart for details.   Patient is in bed  with telemetry, PEG, PICC line, multiple lines and leads, and indwelling urinary catheter in place.   Pt seen for functional activities and exercises as noted:    Cognition  Arousal/Alertness: Appropriate responses to stimuli  Attention Span: Appears intact  Following Commands: Follows all commands and directions without difficulty  Insights: Fully aware of deficits     Supine Therapeutic Exercise  Ankle Pumps: B x 10-15  Quad/Glut Sets:  B x 10  IR/ER:  B x 10   Heelslides: B AAROM x 10    Leg Press: B x 5  Hip Abduction/Adduction:  B AAROM x 10, R AROM x 5  SAQ:  B x 15-20  Straight Leg Raise:  AAROM B x 10  Shoulder Flexion:  AAROM B x 5, pt able to assist with control of  eccentric contraction, R minimally stronger than L   Hand Squeeze:  B x 10-15  Elbow Flex/Ext:  AAROM B x 5  Wrist Flex/Ext:  B x 10  Elbow/Wrist pronation/supination:  B x 10  Pt instructed in proper technique and pacing for all ex's.   Patient left without needs and call bell within reach.  RN notified of session outcome.

## 2013-05-23 NOTE — Progress Notes (Signed)
Case Manager called and talked to [facility] at [phone] and requested  if there is any bed availability for pt's to be transferred today to the [facility] facility.  CM faxed the [facility] summary as well as progress notes to [place] at the fax number [phone].  She will review the information and call the CM if any vent bed opens up for today.  CM will f/u as needed.

## 2013-05-23 NOTE — Plan of Care (Signed)
Problem: Mechanical Ventilation  Goal: Tracheostomy will be maintained  Outcome: Progressing  Intervention: Trach care every shift and as needed  Trach care provided. Pt tolerated trach care. Perform Trach Care every shift.  Intervention: Keep additional trach at bedside but one size smaller in pediatrics  Additional #8 shiley trach present at the bedside.

## 2013-05-23 NOTE — Progress Notes (Signed)
[facility]- Critical Care Note     ICU Daily Progress Note        Date Time: 05/23/2013 1:15 PM  Patient Name: Adam Simmons  Attending Physician: Lawernce Ion, MD  Room: IC05/IC05-A   Admit Date: 04/24/2013  LOS: 29 days            Assessment:     Patient Active Problem List   Diagnosis   . Respiratory failure   . Pneumonia   . Hypokalemia   . ARDS (adult respiratory distress syndrome)   . DM (diabetes mellitus)   . AKI (acute kidney injury)   . Pneumococcal sepsis   . Arm DVT (deep venous thromboembolism), acute     2/3 ARDS / PNA / Sepsis w/ resp failure- intubated, Flu test-neg   Resp c+s= gm + cocci , x2BC= gm + cocci resembling strep   2/6 [place] placed for worsening creatinine, 14 L positive   2/8 dialysis   2/10 failed SBT   2/11 tachypnic, readjusted ventilator tidal volume.   2/13 hemodialysis catheter removed, bronchoscopy with improved pulmonary toilet   2/15 pulmonary toilet, mucomyst, chest PT vest   2/17 fever 102F, cooling blanket   2/23 trach shiley #8, cuffed, peg   2/24 pressure support trial 10/5 x 2hrs tolerated   2/26 PS trial 10/5 x 4 hrs bid   2/28 trach collar in am  DVT in R ar, axillary and subclavian vein, receiving coumadin. Supratherapeutic INR today, coumadin HELD.    Doing well, tracheostomy collar during the day, resting on vent at night, probably can go 24 hrs off at this time.  Improving neuromuscular strength,, able to talk with [facility] valve, OOB to chair.      Plan:   Transfer to [facility] in [place].        Subjective:   Alert, comfortable, nad    Medications:       Scheduled Meds: PRN Meds:         albuterol-ipratropium 3 mL Nebulization Q4H SCH   ALPRAZolam 0.5 mg Per G Tube QHS   insulin aspart 1-12 Units Subcutaneous TID w/meals & HS   insulin glargine 20 Units Subcutaneous Daily   lactobacillus/streoptococcus 1 capsule Oral Daily   nystatin  Topical BID   pantoprazole 40 mg Oral Daily   sodium chloride (PF) 10 mL Intracatheter Daily    [DISCONTINUED] insulin aspart 1-12 Units Subcutaneous Q4H   [DISCONTINUED] sucralfate 1 g Oral Q6H   [DISCONTINUED] warfarin 2.5 mg Oral Daily at 1800       Continuous Infusions:         acetaminophen 500 mg Q4H PRN   albuterol 2.5 mg Q1H PRN   dextrose 15 g PRN   dextrose 25 mL PRN   glucagon (rDNA) 1 mg PRN   magnesium sulfate 1 g PRN   midazolam 2 mg Q1H PRN   morphine 2.5 mg Q2H PRN   morphine 2 mg Q2H PRN   ondansetron 4 mg Q8H PRN   potassium chloride 20 mEq PRN   simethicone 20 mg Q6H PRN   sodium phosphate IVPB 15 mmol PRN   sodium phosphate IVPB 25 mmol PRN   sodium phosphate IVPB 35 mmol PRN   zinc Oxide  PRN             Physical Exam:     Filed Vitals:    05/23/13 0800 05/23/13 0900 05/23/13 1000 05/23/13 1100   BP: 126/74 124/76 120/79 117/73  Pulse: 92 107 95 97   Temp:   96.7 F (35.9 C)    TempSrc:   Temporal Artery    Resp: 22 19 24 25    Height:       Weight:       SpO2: 98% 99% 98% 98%     Temp (24hrs), Avg:97 F (36.1 C), Min:96.4 F (35.8 C), Max:97.4 F (36.3 C)           03/03 0701 - 03/04 0700  In: 800 [P.O.:600]  Out: 500 [Urine:500]       General Appearance: comfortable respirations  Mental status: alert  Neuro:  Moves ext 4/5 strength  H & N: trach site clean, PM valve in place  Lungs: clear  Cardiac: reg  Abdomen:  Soft, nt, peg in place  Extremities: trace edema  Skin: warm, no rash      Data:       Vent Settings:    Vent Settings  Vent Mode: PRVC  FiO2: 40 %  Resp Rate (Set): 20   Vt (Set, mL): 500 mL  PIP Observed (cm H2O): 12 cm H2O  PEEP/EPAP: 5 cm H20  Pressure Support / IPAP: 10 cmH20  Mean Airway Pressure: 9 cmH20      Labs:     Recent CBC   Lab 05/23/13 0406 05/22/13 0406 05/21/13 0422   WBC 14.73* 15.99* 13.00*   RBC 3.70* 4.05* 3.77*   HGB 10.2* 11.3* 10.4*   HCT 31.2* 34.6* 32.2*   MCV 84.3 85.4 85.4   PLT 264 288 302         Lab 05/23/13 0406 05/22/13 0406 05/21/13 0422 05/20/13 1050 05/20/13 0427 05/19/13 1612 05/17/13 0629   NA 134* 136 137 -- -- -- --   K 3.9 4.2 4.1  -- -- -- --   CL 94* 96* 96* -- -- -- --   CO2 28 26 29  -- -- -- --   GLU 163* 211* 189* -- -- -- --   BUN 32.4* 32.3* 20.5 -- -- -- --   CREAT 0.9 0.9 0.8 -- -- -- --   MG 1.9 -- -- -- -- -- 1.7   PHOS -- -- -- -- -- -- --   AST -- -- -- -- -- -- --   ALT -- -- -- -- -- -- --   ALKPHOS -- -- -- -- -- -- --   BILITOTAL -- -- -- -- -- -- --   BILIDIRECT -- -- -- -- -- -- --   LIP -- -- -- -- -- -- --   BNP -- -- -- -- -- -- --   PT 43.1* 30.7* 25.5* -- -- -- --   INR 4.7 3.0 2.4 -- -- -- --   PTT -- -- -- 67* 138* 85* --   DDIMER -- -- -- -- -- -- --   CK -- -- -- -- -- -- --   CKMB -- -- -- -- -- -- --   TROPI -- -- -- -- -- -- --   MYOGLOBIN -- -- -- -- -- -- --           Rads:     Radiology Results (24 Hour)     ** No Results found for the last 24 hours. **          Signed by: Durward Fortes, MD  Date/Time: 05/23/2013 1:15 PM

## 2014-06-22 ENCOUNTER — Encounter (HOSPITAL_COMMUNITY): Payer: Self-pay | Admitting: Emergency Medicine

## 2014-06-22 ENCOUNTER — Emergency Department (HOSPITAL_COMMUNITY): Payer: 59

## 2014-06-22 ENCOUNTER — Emergency Department (HOSPITAL_COMMUNITY)
Admission: EM | Admit: 2014-06-22 | Discharge: 2014-06-23 | Disposition: A | Discharge status: Home / Self Care | Payer: 59 | Attending: Emergency Medicine | Admitting: Emergency Medicine

## 2014-06-22 DIAGNOSIS — Z7951 Long term (current) use of inhaled steroids: Secondary | ICD-10-CM | POA: Diagnosis not present

## 2014-06-22 DIAGNOSIS — E119 Type 2 diabetes mellitus without complications: Secondary | ICD-10-CM | POA: Diagnosis not present

## 2014-06-22 DIAGNOSIS — Z8669 Personal history of other diseases of the nervous system and sense organs: Secondary | ICD-10-CM | POA: Insufficient documentation

## 2014-06-22 DIAGNOSIS — Z8701 Personal history of pneumonia (recurrent): Secondary | ICD-10-CM | POA: Insufficient documentation

## 2014-06-22 DIAGNOSIS — N1330 Unspecified hydronephrosis: Secondary | ICD-10-CM | POA: Diagnosis not present

## 2014-06-22 DIAGNOSIS — N200 Calculus of kidney: Secondary | ICD-10-CM | POA: Diagnosis not present

## 2014-06-22 DIAGNOSIS — Z79899 Other long term (current) drug therapy: Secondary | ICD-10-CM | POA: Diagnosis not present

## 2014-06-22 DIAGNOSIS — Z720 Tobacco use: Secondary | ICD-10-CM | POA: Diagnosis not present

## 2014-06-22 DIAGNOSIS — R109 Unspecified abdominal pain: Secondary | ICD-10-CM | POA: Diagnosis present

## 2014-06-22 HISTORY — DX: Calculus of kidney: N20.0

## 2014-06-22 HISTORY — DX: Sleep apnea, unspecified: G47.30

## 2014-06-22 HISTORY — DX: Type 2 diabetes mellitus without complications: E11.9

## 2014-06-22 HISTORY — DX: Pneumonia due to Streptococcus pneumoniae: J13

## 2014-06-22 LAB — URINALYSIS, ROUTINE W REFLEX MICROSCOPIC
BILIRUBIN URINE: NEGATIVE
Glucose, UA: >1000 mg/dL — AB
KETONES UR: NEGATIVE mg/dL
LEUKOCYTES UA: NEGATIVE
Nitrite: NEGATIVE
PH: 5 (ref 5.0–8.0)
Protein, ur: 100 mg/dL — AB
SPECIFIC GRAVITY, URINE: 1.03 (ref 1.005–1.030)
UROBILINOGEN UA: 0.2 mg/dL (ref 0.0–1.0)

## 2014-06-22 LAB — URINE MICROSCOPIC-ADD ON

## 2014-06-22 LAB — CBC
HEMATOCRIT: 42 % (ref 39.0–52.0)
Hemoglobin: 13.9 g/dL (ref 13.0–17.0)
MCH: 28.2 pg (ref 26.0–34.0)
MCHC: 33.1 g/dL (ref 30.0–36.0)
MCV: 85.2 fL (ref 78.0–100.0)
PLATELETS: 159 10*3/uL (ref 150–400)
RBC: 4.93 MIL/uL (ref 4.22–5.81)
RDW: 15.2 % (ref 11.5–15.5)
WBC: 10 10*3/uL (ref 4.0–10.5)

## 2014-06-22 MED ORDER — KETOROLAC TROMETHAMINE 30 MG/ML IJ SOLN
30.0000 mg | Freq: Once | INTRAMUSCULAR | Status: AC
  Administered 2014-06-22: 30 mg via INTRAVENOUS
  Filled 2014-06-22: qty 1

## 2014-06-22 MED ORDER — HYDROMORPHONE HCL 1 MG/ML IJ SOLN
1.0000 mg | Freq: Once | INTRAMUSCULAR | Status: AC
  Administered 2014-06-23: 1 mg via INTRAVENOUS
  Filled 2014-06-22: qty 1

## 2014-06-22 MED ORDER — ONDANSETRON HCL 4 MG/2ML IJ SOLN
4.0000 mg | Freq: Once | INTRAMUSCULAR | Status: AC
  Administered 2014-06-23: 4 mg via INTRAVENOUS
  Filled 2014-06-22: qty 2

## 2014-06-22 NOTE — ED Notes (Signed)
Patient transported to CT 

## 2014-06-22 NOTE — ED Notes (Signed)
Pt c/o L flank pain x 3 months, intermittent hematuria, worse with movement and breathing.

## 2014-06-22 NOTE — ED Provider Notes (Signed)
CSN: [id number]     Arrival date & time 06/22/14  2132 History   First MD Initiated Contact with Patient 06/22/14 2216     Chief Complaint  Patient presents with  . Flank Pain     (Consider location/radiation/quality/duration/timing/severity/associated sxs/prior Treatment) HPI  Pt with hx of multiple prior renal stones presenting with c/o left sided flank pain.  Pt states pain has been present over the past 3 months, but became worse tonight.  No fever/chills. No vomiting.  Pain feels like prior renal stones.  He states last week he felt some burning with urination and he has intermittently seen blood in his urine over the past several weeks.  Pain is worse with movement.  He states he has not had any treatment prior to arrival. There are no other associated systemic symptoms, there are no other alleviating or modifying factors.   Past Medical History  Diagnosis Date  . Kidney stones   . Pneumococcal pneumonia 2014  . Diabetes mellitus without complication   . Sleep apnea    Past Surgical History  Procedure Laterality Date  . Lithotripsy    . Kidney surgery    . Tracheostomy closure    . Removal of gastrostomy tube     No family history on file. History  Substance Use Topics  . Smoking status: Current Some Day Smoker    Types: Cigars  . Smokeless tobacco: Not on file  . Alcohol Use: No    Review of Systems  ROS reviewed and all otherwise negative except for mentioned in HPI    Allergies  Invokana  Home Medications   Prior to Admission medications   Medication Sig Start Date End Date Taking? Authorizing Provider  ALPRAZolam Prudy Feeler(XANAX) 1 MG tablet Take 1 mg by mouth 2 (two) times daily as needed for anxiety.   Yes Historical Provider, MD  CINNAMON PO Take 2 capsules by mouth 2 (two) times daily.   Yes Historical Provider, MD  Cranberry-Vitamin C-Vitamin E 4200-20-3 MG-MG-UNIT CAPS Take 2 capsules by mouth 2 (two) times daily.   Yes Historical Provider, MD  FLUoxetine  (PROZAC) 20 MG capsule Take 60 mg by mouth daily.   Yes Historical Provider, MD  Fluticasone Furoate-Vilanterol 100-25 MCG/INH AEPB Inhale 1 puff into the lungs daily as needed (for shortness of breath).   Yes Historical Provider, MD  gabapentin (NEURONTIN) 100 MG capsule Take 200 mg by mouth 2 (two) times daily.   Yes Historical Provider, MD  Glucosamine-Vitamin D 1000-200 MG-UNIT TABS Take 2 capsules by mouth 2 (two) times daily.   Yes Historical Provider, MD  glyBURIDE-metformin (GLUCOVANCE) 5-500 MG per tablet Take 1 tablet by mouth 2 (two) times daily.   Yes Historical Provider, MD  Lysine 500 MG TABS Take 1,000 mg by mouth daily.   Yes Historical Provider, MD  Multiple Vitamin (MULTIVITAMIN WITH MINERALS) TABS tablet Take 1 tablet by mouth daily.   Yes Historical Provider, MD  pantoprazole (PROTONIX) 40 MG tablet Take 40 mg by mouth 2 (two) times daily.   Yes Historical Provider, MD  Potassium 99 MG TABS Take 1 tablet by mouth 2 (two) times daily.   Yes Historical Provider, MD  ondansetron (ZOFRAN) 4 MG tablet Take 1 tablet (4 mg total) by mouth every 6 (six) hours. 06/23/14   Jerelyn ScottMartha Linker, MD  oxyCODONE-acetaminophen (PERCOCET/ROXICET) 5-325 MG per tablet Take 1-2 tablets by mouth every 6 (six) hours as needed for severe pain. 06/23/14   Jerelyn ScottMartha Linker, MD   BP 115/86  mmHg  Pulse 89  Temp(Src) 98.4 F (36.9 C) (Oral)  Resp 19  Ht  (1.676 m)  Wt 292 lb (132.45 kg)  BMI 47.15 kg/m2  SpO2 94%  Vitals reviewed Physical Exam  Physical Examination: General appearance - alert, well appearing, and in no distress Mental status - alert, oriented to person, place, and time Eyes - no conjunctival injection, no scleral icterus Mouth - mucous membranes moist, pharynx normal without lesions Chest - clear to auscultation, no wheezes, rales or rhonchi, symmetric air entry Heart - normal rate, regular rhythm, normal S1, S2, no murmurs, rubs, clicks or gallops Abdomen - soft, nontender,  nondistended, no masses or organomegaly Back exam - mild left CVA tenderness, no midline tenderness to palpation Extremities - peripheral pulses normal, no pedal edema, no clubbing or cyanosis Skin - normal coloration and turgor, no rashes  ED Course  Procedures (including critical care time) Labs Review Labs Reviewed  URINALYSIS, ROUTINE W REFLEX MICROSCOPIC - Abnormal; Notable for the following:    APPearance CLOUDY (*)    Glucose, UA >1000 (*)    Hgb urine dipstick LARGE (*)    Protein, ur 100 (*)    All other components within normal limits  BASIC METABOLIC PANEL - Abnormal; Notable for the following:    Glucose, Bld 252 (*)    All other components within normal limits  URINE MICROSCOPIC-ADD ON - Abnormal; Notable for the following:    Casts HYALINE CASTS (*)    All other components within normal limits  CBC    Imaging Review Ct Abdomen Pelvis Wo Contrast  06/22/2014   CLINICAL DATA:  Right flank pain, several months duration but steadily worsening.  EXAM: CT ABDOMEN AND PELVIS WITHOUT CONTRAST  TECHNIQUE: Multidetector CT imaging of the abdomen and pelvis was performed following the standard protocol without IV contrast.  COMPARISON:  07/06/2005  FINDINGS: There is an 8 x 11 mm right renal pelvic calculus. Several additional right collecting system calculi are present, less than a cm. No right ureteral calculi are evident. There is no significant right hydronephrosis.  There is a 2.5 cm left renal pelvic calculus with moderately severe hydronephrosis. Multiple left collecting system calculi are present, measuring up to a 2.5 cm staghorn in the lower pole. No left ureteral calculi are evident.  There are unremarkable unenhanced appearances of the liver, spleen, pancreas and adrenals.  Mesentery and bowel are unremarkable.  The abdominal aorta is normal in caliber with mild atherosclerotic calcification.  There is no adenopathy.  There is no ascites.  There is a small fat containing  umbilical hernia.  There is bilateral L5 spondylolysis with grade 1 spondylolisthesis at L5-S1. There is severe degenerative lumbar disc disease with mild degenerative retrolisthesis at L1-2 and L2-3.  IMPRESSION: 1. Bilateral nephrolithiasis. There is marked hydronephrosis on the left, and a 2.5 cm left renal pelvic stone. 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 2. No other acute findings are evident in the abdomen or pelvis. 3. 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 Severe degenerative disc changes with mild degenerative-appearing spondylolisthesis at L1-2 and at L2-3.   Electronically Signed   By: Ellery Plunk M.D.   On: 06/22/2014 23:54     EKG Interpretation None      MDM   Final diagnoses:  Renal stone     Pt presenting with chronic left flank pain for the past 3 months.  There is left hydronephrosis and  large stone in renal pelvis.  Pain became worse tonight.  Urine reassuring, no signs of renal insufficiency.  Pt has improvement in pain after pain meds.  Pt will need to followup with urology, but I feel this can be accomplished as an outpatient due to the chronic nature of his pain and normal labs tonight.   Patient is overall nontoxic and well hydrated in appearance.  Will give pain and nausea medications, will advise patient not to use ibuprofen in case he needs a procedure to be scheduled due to size of stone.  Discharged with strict return precautions.  Pt agreeable with plan.   Jerelyn Scott, MD 06/23/14 [phone]

## 2014-06-22 NOTE — ED Notes (Signed)
MD at bedside. 

## 2014-06-23 LAB — BASIC METABOLIC PANEL
Anion gap: 11 (ref 5–15)
BUN: 18 mg/dL (ref 6–23)
CALCIUM: 9 mg/dL (ref 8.4–10.5)
CO2: 26 mmol/L (ref 19–32)
CREATININE: 0.79 mg/dL (ref 0.50–1.35)
Chloride: 101 mmol/L (ref 96–112)
GFR calc Af Amer: >90 mL/min (ref >90)
Glucose, Bld: 252 mg/dL — ABNORMAL HIGH (ref 70–99)
Potassium: 3.8 mmol/L (ref 3.5–5.1)
SODIUM: 138 mmol/L (ref 135–145)

## 2014-06-23 MED ORDER — OXYCODONE-ACETAMINOPHEN 5-325 MG PO TABS: 1.0000 | ORAL_TABLET | Freq: Four times a day (QID) | ORAL | Status: DC | PRN

## 2014-06-23 MED ORDER — ONDANSETRON HCL 4 MG PO TABS: 4.0000 mg | ORAL_TABLET | Freq: Four times a day (QID) | ORAL | Status: DC

## 2014-06-23 NOTE — Discharge Instructions (Signed)
Return to the ED with any concerns including fever/chills, vomiting and not able to keep down liquids, pain not controlled by pain medications, decreased level of alertness/lethargy, or any other alarming symptoms °

## 2014-07-26 ENCOUNTER — Ambulatory Visit (HOSPITAL_COMMUNITY): Admission: RE | Admit: 2014-07-26 | Payer: 59 | Source: Ambulatory Visit | Admitting: Urology

## 2014-07-26 ENCOUNTER — Encounter (HOSPITAL_COMMUNITY): Admission: RE | Payer: Self-pay | Source: Ambulatory Visit

## 2014-07-26 SURGERY — NEPHROLITHOTOMY PERCUTANEOUS
Anesthesia: General

## 2015-04-24 IMAGING — CT CT ABD-PELV W/O CM
1 series · 15 of 29 positions shown, 19 images · non-contrast
Comparison: 07/06/2005

CLINICAL DATA: Right flank pain, several months duration but
steadily worsening.

EXAM:
CT ABDOMEN AND PELVIS WITHOUT CONTRAST
TECHNIQUE: Multidetector CT imaging of the abdomen and pelvis was performed
following the standard protocol without IV contrast.

[Series 6: lung · axial · 0.90mm/px · z∈[+26,+151]mm · 15 of 29 slices shown, 19 images]
[im 3/29  soft-tissue]
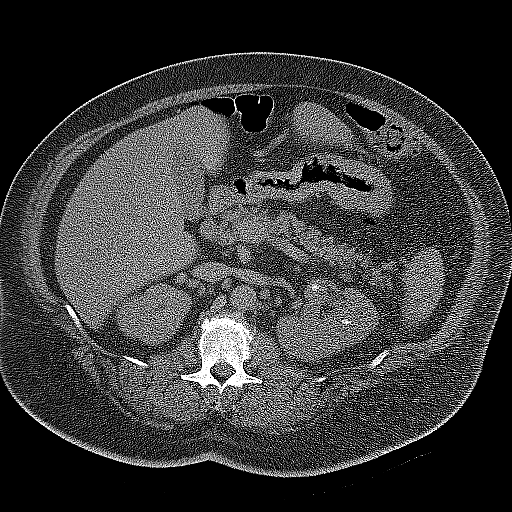
[im 3/29  bone]
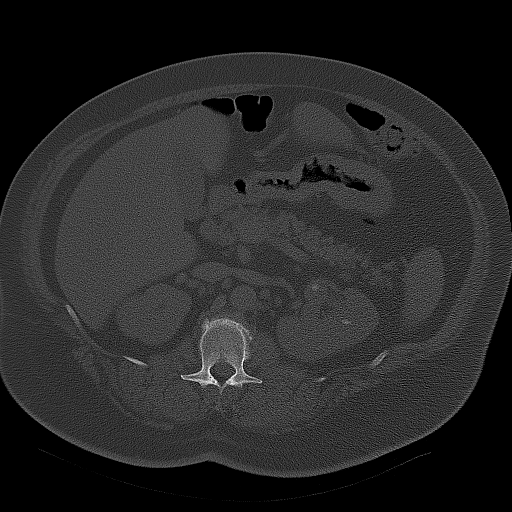
[im 5/29  soft-tissue]
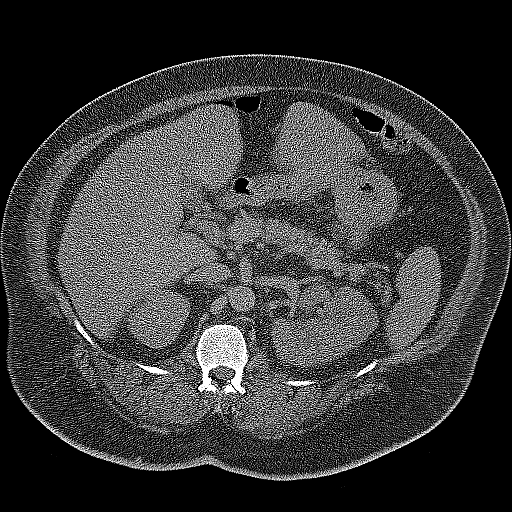
[im 7/29  soft-tissue]
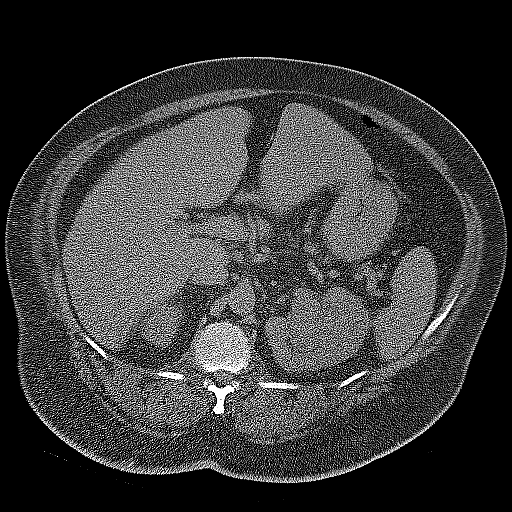
[im 9/29  soft-tissue]
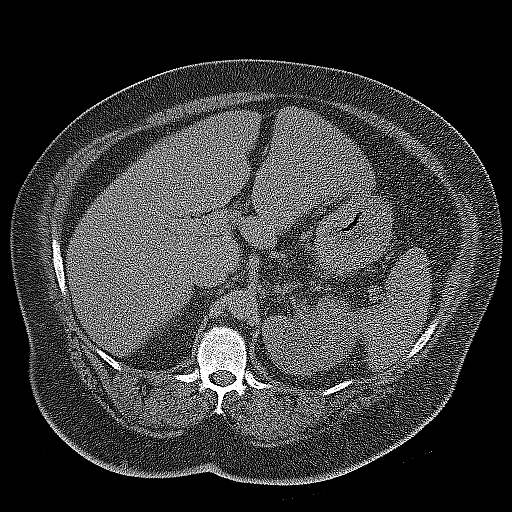
[im 11/29  soft-tissue]
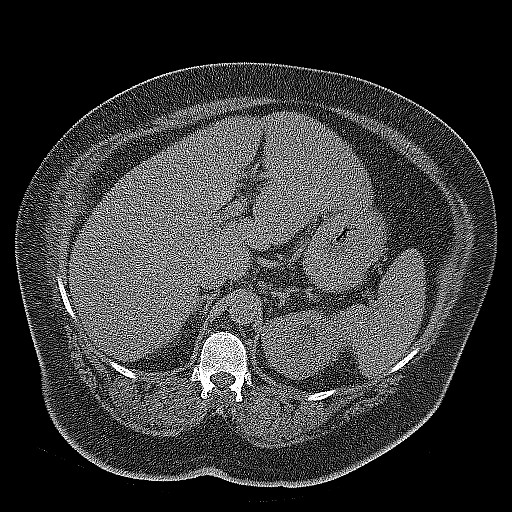
[im 13/29  soft-tissue]
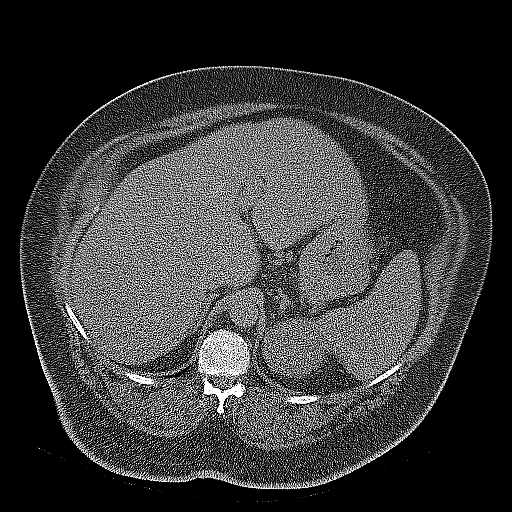
[im 15/29  soft-tissue]
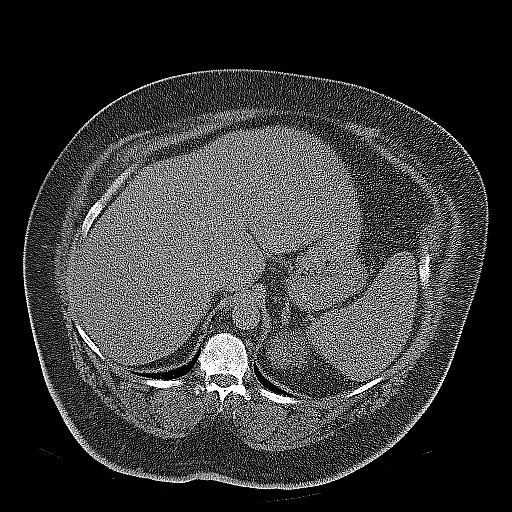
[im 17/29  soft-tissue]
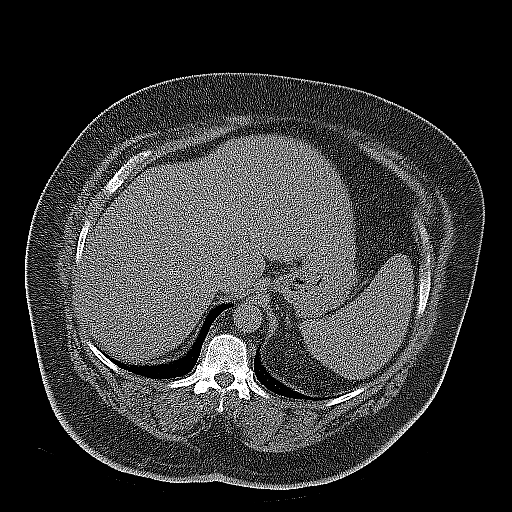
[im 19/29  soft-tissue]
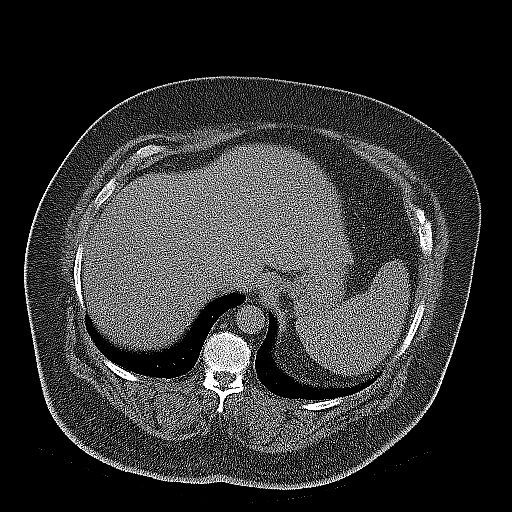
[im 19/29  bone]
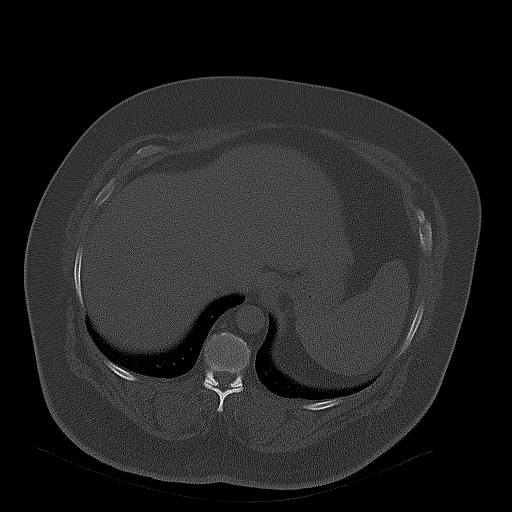
[im 21/29  soft-tissue]
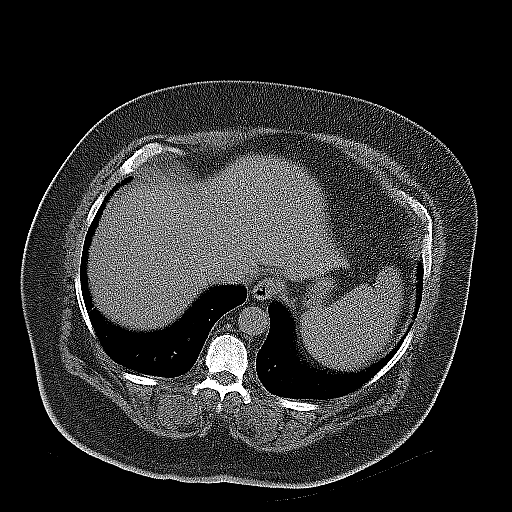
[im 23/29  soft-tissue]
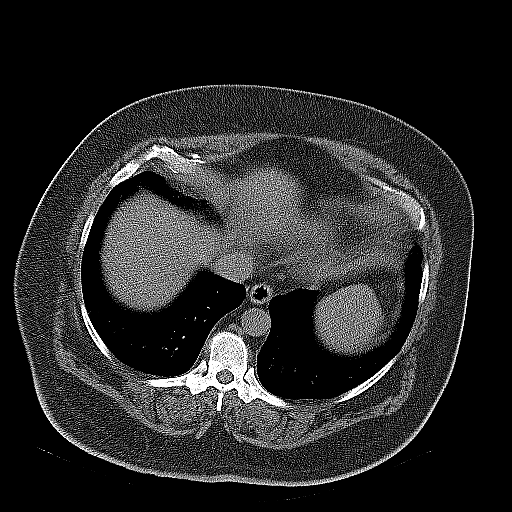
[im 25/29  soft-tissue]
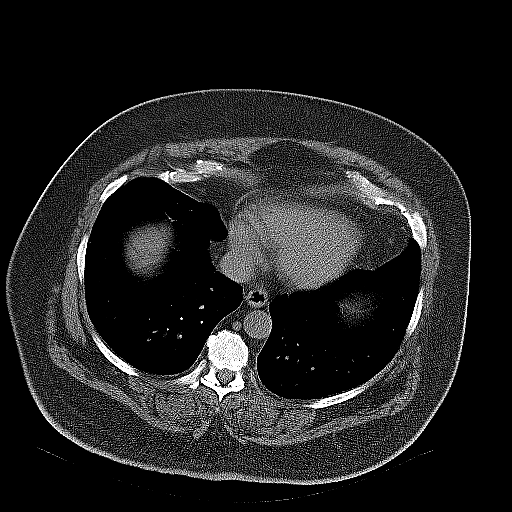
[im 25/29  lung]
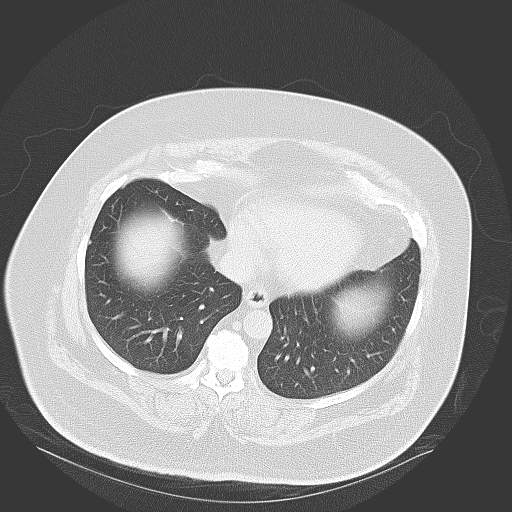
[im 26/29  lung]
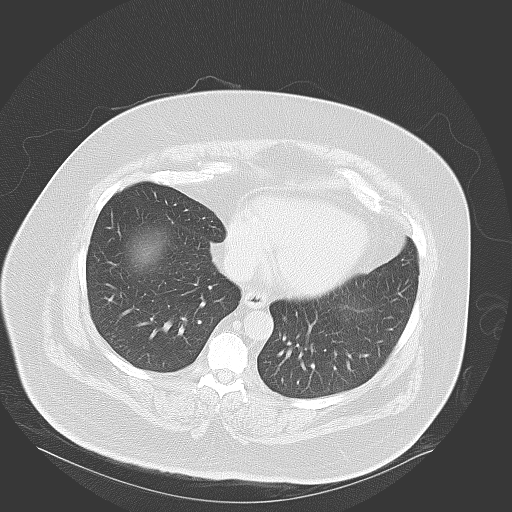
[im 27/29  soft-tissue]
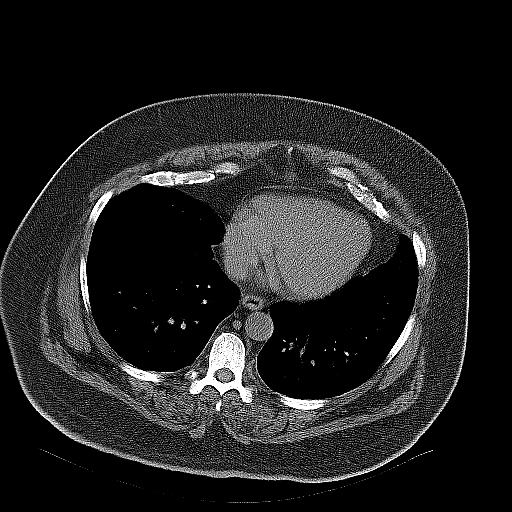
[im 27/29  lung]
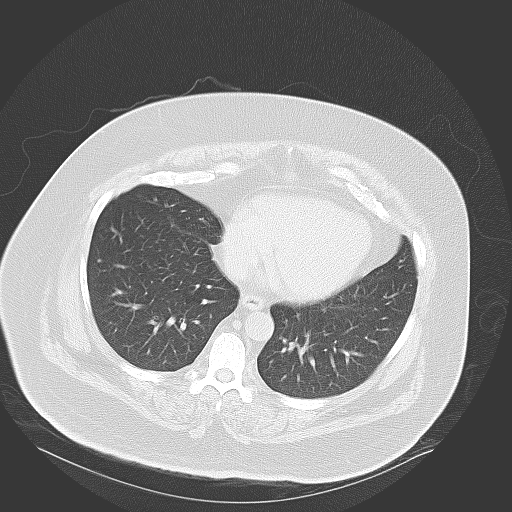
[im 28/29  lung]
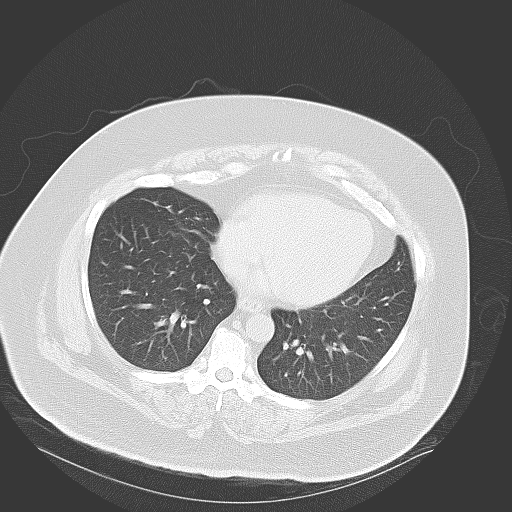

[15 of 29 positions shown; findings below may reference images not displayed]

FINDINGS: There is an 8 x 11 mm right renal pelvic calculus. Several
additional right collecting system calculi are present, less than a
cm. No right ureteral calculi are evident. There is no significant
right hydronephrosis.

There is a 2.5 cm left renal pelvic calculus with moderately severe
hydronephrosis. Multiple left collecting system calculi are present,
measuring up to a 2.5 cm staghorn in the lower pole. No left
ureteral calculi are evident.

There are unremarkable unenhanced appearances of the liver, spleen,
pancreas and adrenals.

Mesentery and bowel are unremarkable.

The abdominal aorta is normal in caliber with mild atherosclerotic
calcification.

There is no adenopathy.  There is no ascites.

There is a small fat containing umbilical hernia.

There is bilateral L5 spondylolysis with grade 1 spondylolisthesis
at L5-S1. There is severe degenerative lumbar disc disease with mild
degenerative retrolisthesis at L1-2 and L2-3.
IMPRESSION: 1. Bilateral nephrolithiasis. There is marked hydronephrosis on the
left, and a 2.5 cm left renal pelvic stone. There also is a 2.5 cm
staghorn calculus in the left lower pole.
2. No other acute findings are evident in the abdomen or pelvis.
3. Bilateral L5 spondylolysis with grade 1 spondylolisthesis at
L5-S1. Severe degenerative disc changes with mild
degenerative-appearing spondylolisthesis at L1-2 and at L2-3.

## 2017-03-17 LAB — LIPID PANEL
Cholesterol: 136 (ref 0–200)
HDL: 33 — AB (ref 35–70)
LDL Cholesterol: 65
Triglycerides: 190 — AB (ref 40–160)

## 2017-03-17 LAB — VITAMIN D 25 HYDROXY (VIT D DEFICIENCY, FRACTURES): Vit D, 25-Hydroxy: 28.5

## 2017-03-17 LAB — TSH: TSH: 1.3 (ref <=5.90)

## 2017-03-17 LAB — MICROALBUMIN, URINE: Microalb, Ur: 1301

## 2017-06-21 LAB — BASIC METABOLIC PANEL
BUN: 33 — AB (ref 4–21)
CREATININE: 1.2 (ref <=1.3)

## 2017-06-21 LAB — HEMOGLOBIN A1C: Hemoglobin A1C: 9.2

## 2017-07-25 ENCOUNTER — Ambulatory Visit: Payer: Medicare Other | Admitting: "Endocrinology

## 2017-08-05 ENCOUNTER — Ambulatory Visit: Payer: Medicare Other | Admitting: "Endocrinology

## 2017-09-05 ENCOUNTER — Encounter: Payer: Self-pay | Admitting: "Endocrinology

## 2017-09-05 ENCOUNTER — Ambulatory Visit (INDEPENDENT_AMBULATORY_CARE_PROVIDER_SITE_OTHER): Payer: 59 | Admitting: "Endocrinology

## 2017-09-05 VITALS — BP 147/79 | HR 72 | Ht 66.0 in | Wt 313.0 lb

## 2017-09-05 DIAGNOSIS — E782 Mixed hyperlipidemia: Secondary | ICD-10-CM

## 2017-09-05 DIAGNOSIS — N182 Chronic kidney disease, stage 2 (mild): Secondary | ICD-10-CM | POA: Diagnosis not present

## 2017-09-05 DIAGNOSIS — E1122 Type 2 diabetes mellitus with diabetic chronic kidney disease: Secondary | ICD-10-CM | POA: Diagnosis not present

## 2017-09-05 DIAGNOSIS — I10 Essential (primary) hypertension: Secondary | ICD-10-CM | POA: Diagnosis not present

## 2017-09-05 DIAGNOSIS — Z794 Long term (current) use of insulin: Secondary | ICD-10-CM

## 2017-09-05 NOTE — Progress Notes (Signed)
Endocrinology Consult Note       09/05/2017, 6:46 PM   Subjective:    Patient ID: Alexander [name], male    DOB: 04-Jan-1966.  Alexander [name] is being seen in consultation for management of currently uncontrolled symptomatic diabetes requested by  Alita Chyle, FNP.   Past Medical History:  Diagnosis Date  . Diabetes mellitus without complication (HCC)   . Diabetes mellitus, type II (HCC)   . Hyperlipidemia   . Hypertension   . Kidney stones   . Pneumococcal pneumonia (HCC) 2014  . Sleep apnea    Past Surgical History:  Procedure Laterality Date  . KIDNEY SURGERY    . LITHOTRIPSY    . REMOVAL OF GASTROSTOMY TUBE    . TRACHEOSTOMY CLOSURE     Social History   Socioeconomic History  . Marital status: Married    Spouse name: Not on file  . Number of children: Not on file  . Years of education: Not on file  . Highest education level: Not on file  Occupational History  . Not on file  Social Needs  . Financial resource strain: Not on file  . Food insecurity:    Worry: Not on file    Inability: Not on file  . Transportation needs:    Medical: Not on file    Non-medical: Not on file  Tobacco Use  . Smoking status: Current Every Day Smoker    Packs/day: 1.00    Years: 40.00    Pack years: 40.00    Types: Cigars, Cigarettes  . Smokeless tobacco: Never Used  Substance and Sexual Activity  . Alcohol use: No  . Drug use: Yes    Types: Marijuana  . Sexual activity: Not on file  Lifestyle  . Physical activity:    Days per week: Not on file    Minutes per session: Not on file  . Stress: Not on file  Relationships  . Social connections:    Talks on phone: Not on file    Gets together: Not on file    Attends religious service: Not on file    Active member of club or organization: Not on file    Attends meetings of clubs or organizations: Not on file    Relationship status: Not on file   Other Topics Concern  . Not on file  Social History Narrative  . Not on file   Outpatient Encounter Medications as of 09/05/2017  Medication Sig  . albuterol (PROVENTIL HFA;VENTOLIN HFA) 108 (90 Base) MCG/ACT inhaler Inhale into the lungs every 4 (four) hours as needed for wheezing or shortness of breath.  Marland Kitchen amLODipine (NORVASC) 10 MG tablet Take 10 mg by mouth daily.  Marland Kitchen atorvastatin (LIPITOR) 40 MG tablet Take 40 mg by mouth daily.  . fluticasone furoate-vilanterol (BREO ELLIPTA) 200-25 MCG/INH AEPB Inhale 1 puff into the lungs daily.  . Insulin Detemir (LEVEMIR FLEXTOUCH) 100 UNIT/ML Pen Inject 60 Units into the skin at bedtime.  . insulin lispro (HUMALOG KWIKPEN) 100 UNIT/ML KiwkPen Inject 10-16 Units into the skin 3 (three) times daily before meals.  Marland Kitchen lisinopril (PRINIVIL,ZESTRIL) 20 MG tablet  Take 20 mg by mouth daily.  . naproxen (NAPROSYN) 500 MG tablet Take 500 mg by mouth 2 (two) times daily with a meal.  . pantoprazole (PROTONIX) 40 MG tablet Take 40 mg by mouth daily.  . predniSONE (DELTASONE) 10 MG tablet Take 10 mg by mouth 2 (two) times daily with a meal.  . traZODone (DESYREL) 150 MG tablet Take by mouth at bedtime.  . [DISCONTINUED] glimepiride (AMARYL) 4 MG tablet Take 4 mg by mouth daily with breakfast.  . [DISCONTINUED] ALPRAZolam (XANAX) 1 MG tablet Take 1 mg by mouth 2 (two) times daily as needed for anxiety.  . [DISCONTINUED] CINNAMON PO Take 2 capsules by mouth 2 (two) times daily.  . [DISCONTINUED] Cranberry-Vitamin C-Vitamin E 4200-20-3 MG-MG-UNIT CAPS Take 2 capsules by mouth 2 (two) times daily.  . [DISCONTINUED] FLUoxetine (PROZAC) 20 MG capsule Take 60 mg by mouth daily.  . [DISCONTINUED] Fluticasone Furoate-Vilanterol 100-25 MCG/INH AEPB Inhale 1 puff into the lungs daily as needed (for shortness of breath).  . [DISCONTINUED] gabapentin (NEURONTIN) 100 MG capsule Take 200 mg by mouth 2 (two) times daily.  . [DISCONTINUED] Glucosamine-Vitamin D 1000-200 MG-UNIT  TABS Take 2 capsules by mouth 2 (two) times daily.  . [DISCONTINUED] glyBURIDE-metformin (GLUCOVANCE) 5-500 MG per tablet Take 1 tablet by mouth 2 (two) times daily.  . [DISCONTINUED] Lysine 500 MG TABS Take 1,000 mg by mouth daily.  . [DISCONTINUED] Multiple Vitamin (MULTIVITAMIN WITH MINERALS) TABS tablet Take 1 tablet by mouth daily.  . [DISCONTINUED] ondansetron (ZOFRAN) 4 MG tablet Take 1 tablet (4 mg total) by mouth every 6 (six) hours.  . [DISCONTINUED] oxyCODONE-acetaminophen (PERCOCET/ROXICET) 5-325 MG per tablet Take 1-2 tablets by mouth every 6 (six) hours as needed for severe pain.  . [DISCONTINUED] pantoprazole (PROTONIX) 40 MG tablet Take 40 mg by mouth 2 (two) times daily.  . [DISCONTINUED] Potassium 99 MG TABS Take 1 tablet by mouth 2 (two) times daily.   No facility-administered encounter medications on file as of 09/05/2017.     ALLERGIES: Allergies  Allergen Reactions  . Invokana [Canagliflozin] Other (See Comments)    Yeast infection  . Metformin And Related   . Pioglitazone     VACCINATION STATUS:  There is no immunization history on file for this patient.  Diabetes  He presents for his initial diabetic visit. He has type 2 diabetes mellitus. Onset time: He was diagnosed at approximate age of 29 years. His disease course has been worsening. There are no hypoglycemic associated symptoms. Pertinent negatives for hypoglycemia include no confusion, headaches, pallor or seizures. Associated symptoms include polydipsia and polyuria. Pertinent negatives for diabetes include no chest pain, no fatigue, no polyphagia and no weakness. There are no hypoglycemic complications. Diabetic complications include nephropathy and peripheral neuropathy. Risk factors for coronary artery disease include dyslipidemia, diabetes mellitus, family history, male sex, obesity, hypertension, sedentary lifestyle and tobacco exposure. Current diabetic treatment includes insulin injections (He is  currently on Levemir 60 units twice daily, Humalog 5 units 3 times daily AC, glimepiride.). His weight is increasing steadily. He is following a generally unhealthy diet. When asked about meal planning, he reported none. He has not had a previous visit with a dietitian. He never participates in exercise. (He did not bring any meter nor logs to review today.  His most recent A1c was 9.2%. ) An ACE inhibitor/angiotensin II receptor blocker is being taken. Eye exam is current.  Hyperlipidemia  This is a chronic problem. The current episode started more than 1 year  ago. The problem is uncontrolled. Exacerbating diseases include chronic renal disease, diabetes and obesity. Pertinent negatives include no chest pain, myalgias or shortness of breath. Risk factors for coronary artery disease include family history, dyslipidemia, diabetes mellitus, hypertension, male sex, obesity and a sedentary lifestyle.  Hypertension  This is a chronic problem. The current episode started more than 1 year ago. The problem is uncontrolled. Pertinent negatives include no chest pain, headaches, neck pain, palpitations or shortness of breath. Risk factors for coronary artery disease include dyslipidemia, diabetes mellitus, male gender, obesity, sedentary lifestyle, smoking/tobacco exposure and family history. Past treatments include ACE inhibitors and calcium channel blockers. Identifiable causes of hypertension include chronic renal disease.      Review of Systems  Constitutional: Negative for chills, fatigue, fever and unexpected weight change.  HENT: Negative for dental problem, mouth sores and trouble swallowing.   Eyes: Negative for visual disturbance.  Respiratory: Negative for cough, choking, chest tightness, shortness of breath and wheezing.   Cardiovascular: Negative for chest pain, palpitations and leg swelling.  Gastrointestinal: Negative for abdominal distention, abdominal pain, constipation, diarrhea, nausea and  vomiting.  Endocrine: Positive for polydipsia and polyuria. Negative for polyphagia.  Genitourinary: Negative for dysuria, flank pain, hematuria and urgency.  Musculoskeletal: Positive for gait problem. Negative for back pain, myalgias and neck pain.  Skin: Negative for pallor, rash and wound.  Neurological: Negative for seizures, syncope, weakness, numbness and headaches.  Psychiatric/Behavioral: Negative for confusion and dysphoric mood.    Objective:    BP (!) 147/79   Pulse 72   Ht 5\' 6"  (1.676 m)   Wt (!) 313 lb (142 kg)   BMI 50.52 kg/m   Wt Readings from Last 3 Encounters:  09/05/17 (!) 313 lb (142 kg)  06/22/14 292 lb (132.5 kg)     Physical Exam  Constitutional: He is oriented to person, place, and time. He appears well-developed. He is cooperative. No distress.  Walks with a cane due to knee arthritis.  HENT:  Head: Normocephalic and atraumatic.  Eyes: EOM are normal.  Neck: Normal range of motion. Neck supple. No tracheal deviation present. No thyromegaly present.  Cardiovascular: Normal rate, S1 normal, S2 normal and normal heart sounds. Exam reveals no gallop.  No murmur heard. Pulses:      Dorsalis pedis pulses are 1+ on the right side, and 1+ on the left side.       Posterior tibial pulses are 1+ on the right side, and 1+ on the left side.  Pulmonary/Chest: Breath sounds normal. No respiratory distress. He has no wheezes.  Abdominal: Soft. Bowel sounds are normal. He exhibits no distension. There is no tenderness. There is no guarding and no CVA tenderness.  Musculoskeletal: He exhibits no edema.       Right shoulder: He exhibits no swelling and no deformity.  Neurological: He is alert and oriented to person, place, and time. He has normal strength and normal reflexes. No cranial nerve deficit or sensory deficit. Gait normal.  Skin: Skin is warm and dry. No rash noted. No cyanosis. Nails show no clubbing.  Psychiatric: He has a normal mood and affect. His speech  is normal. Judgment normal. Cognition and memory are normal.  Patient has a reluctant affect.      CMP ( most recent) CMP     Component Value Date/Time   NA 138 06/22/2014 2251   K 3.8 06/22/2014 2251   CL 101 06/22/2014 2251   CO2 26 06/22/2014 2251   GLUCOSE  252 (H) 06/22/2014 2251   BUN 33 (A) 06/21/2017   CREATININE 1.2 06/21/2017   CREATININE 0.79 06/22/2014 2251   CALCIUM 9.0 06/22/2014 2251   GFRNONAA >90 06/22/2014 2251   GFRAA >90 06/22/2014 2251     Diabetic Labs (most recent): Lab Results  Component Value Date   HGBA1C 9.2 06/21/2017     Lipid Panel ( most recent) Lipid Panel     Component Value Date/Time   CHOL 136 03/17/2017   TRIG 190 (A) 03/17/2017   HDL 33 (A) 03/17/2017   LDLCALC 65 03/17/2017      Lab Results  Component Value Date   TSH 1.30 03/17/2017      Assessment & Plan:   1. Type 2 diabetes mellitus with stage 2 chronic kidney disease, with long-term current use of insulin (HCC)   - Alexander [name] has currently uncontrolled symptomatic type 2 DM since 52 years of age,  with most recent A1c of 9.2 %. Recent labs reviewed.  -his diabetes is complicated by renal insufficiency, peripheral neuropathy, obesity/sedentary life and Alexander [name] remains at a high risk for more acute and chronic complications which include CAD, CVA, CKD, retinopathy, and neuropathy. These are all discussed in detail with the patient.  - I have counseled him on diet management and weight loss, by adopting a carbohydrate restricted/protein rich diet.  - Suggestion is made for him to avoid simple carbohydrates  from his diet including Cakes, Sweet Desserts, Ice Cream, Soda (diet and regular), Sweet Tea, Candies, Chips, Cookies, Store Bought Juices, Alcohol in Excess of  1-2 drinks a day, Artificial Sweeteners, and "Sugar-free" Products. This will help patient to have stable blood glucose profile and potentially avoid unintended weight gain.  - I encouraged him  to switch to  unprocessed or minimally processed complex starch and increased protein intake (animal or plant source), fruits, and vegetables.  - he is advised to stick to a routine mealtimes to eat 3 meals  a day and avoid unnecessary snacks ( to snack only to correct hypoglycemia).   - he will be scheduled with Norm Salt, RDN, CDE for individualized diabetes education.  - I have approached him with the following individualized plan to manage diabetes and patient agrees:   -I approached him for treatment with a lower dose of insulin to combat unintended weight gain, patient is reluctant to change his dietary habits however, may end up requiring higher dose of insulin.   -I approached him to lower his Levemir to 60 units nightly  , and increase prandial insulin Humalog to 10-16 units 3 times a day with meals  for pre-meal BG readings of 90-150mg /dl, plus patient specific correction dose for unexpected hyperglycemia above 150mg /dl, associated with strict monitoring of glucose 4 times a day-before meals and at bedtime. - Patient is warned not to take insulin without proper monitoring per orders. -Adjustment parameters are given for hypo and hyperglycemia in writing. -Patient is encouraged to call clinic for blood glucose levels less than 70 or above 300 mg /dl.  - I will discontinue glimepiride, risk outweighs benefit for this patient. -Patient does not tolerate metformin. -Patient is not a candidate for GLP 2 inhibitors due to CKD.  - he will be considered for incretin therapy as appropriate next visit. - Patient specific target  A1c;  LDL, HDL, Triglycerides, and  Waist Circumference were discussed in detail.  2) BP/HTN: His blood pressure is not controlled to target. Continue current medications including lisinopril 20 mg  p.o. daily along with amlodipine . 3) Lipids/HPL: His recent lipid panel showed controlled LDL.  He is advised to continue his atorvastatin 40 mg p.o. Nightly.  4)   Weight/Diet: He is reluctantly accepting diabetes education.  CDE Consult will be initiated , exercise, and detailed carbohydrates information provided.  5) Chronic Care/Health Maintenance:  -he  is on ACEI/ARB and Statin medications and  is encouraged to continue to follow up with Ophthalmology, Dentist,  Podiatrist at least yearly or according to recommendations, and advised to  Quit patient has 50+-pack-year history of smoking.  I have recommended yearly flu vaccine and pneumonia vaccination at least every 5 years; moderate intensity exercise for up to 150 minutes weekly; and  sleep for at least 7 hours a day.  - I advised patient to maintain close follow up with Alita Chyle, FNP for primary care needs.  - Time spent with the patient: 45 minutes, of which >50% was spent in obtaining information about his symptoms, reviewing his previous labs, evaluations, and treatments, counseling him about his currently uncontrolled, complicated type 2 diabetes, hyperlipidemia, hypertension, and developing developing  plans for long term treatment based on the latest recommendations.  Pearla Dubonnet participated in the discussions, expressed understanding, and voiced agreement with the above plans.  All questions were answered to his satisfaction. he is encouraged to contact clinic should he have any questions or concerns prior to his return visit.  Follow up plan: -He will return in 5 weeks with repeat labs, meter and logs. Marquis Lunch, MD [facility] [address] [place], [place] [address] Phone: [phone]  Fax: [phone]    09/05/2017, 6:46 PM  This note was partially dictated with voice recognition software. Similar sounding words can be transcribed inadequately or may not  be corrected upon review.

## 2017-09-05 NOTE — Patient Instructions (Signed)

## 2017-10-10 ENCOUNTER — Encounter: Payer: 59 | Admitting: "Endocrinology

## 2017-10-11 NOTE — Progress Notes (Signed)
This encounter was created in error - please disregard.

## 2017-11-01 ENCOUNTER — Ambulatory Visit: Payer: 59 | Admitting: "Endocrinology

## 2018-01-02 LAB — BASIC METABOLIC PANEL
BUN: 16 (ref 4–21)
CREATININE: 1 (ref 0.6–1.3)

## 2018-01-02 LAB — LIPID PANEL
CHOLESTEROL: 132 (ref 0–200)
HDL: 35 (ref 35–70)
LDL CALC: 66
TRIGLYCERIDES: 165 — AB (ref 40–160)

## 2018-01-02 LAB — HEMOGLOBIN A1C: Hemoglobin A1C: 8.5

## 2018-01-26 ENCOUNTER — Encounter: Payer: Self-pay | Admitting: "Endocrinology

## 2018-01-26 ENCOUNTER — Ambulatory Visit (INDEPENDENT_AMBULATORY_CARE_PROVIDER_SITE_OTHER): Payer: 59 | Admitting: "Endocrinology

## 2018-01-26 VITALS — BP 126/76 | HR 63 | Ht 66.0 in | Wt 319.0 lb

## 2018-01-26 DIAGNOSIS — I10 Essential (primary) hypertension: Secondary | ICD-10-CM

## 2018-01-26 DIAGNOSIS — N182 Chronic kidney disease, stage 2 (mild): Secondary | ICD-10-CM

## 2018-01-26 DIAGNOSIS — Z9119 Patient's noncompliance with other medical treatment and regimen: Secondary | ICD-10-CM

## 2018-01-26 DIAGNOSIS — Z794 Long term (current) use of insulin: Secondary | ICD-10-CM

## 2018-01-26 DIAGNOSIS — E782 Mixed hyperlipidemia: Secondary | ICD-10-CM | POA: Diagnosis not present

## 2018-01-26 DIAGNOSIS — E1122 Type 2 diabetes mellitus with diabetic chronic kidney disease: Secondary | ICD-10-CM | POA: Diagnosis not present

## 2018-01-26 DIAGNOSIS — Z91199 Patient's noncompliance with other medical treatment and regimen due to unspecified reason: Secondary | ICD-10-CM | POA: Insufficient documentation

## 2018-01-26 DIAGNOSIS — F17200 Nicotine dependence, unspecified, uncomplicated: Secondary | ICD-10-CM

## 2018-01-26 MED ORDER — INSULIN DETEMIR 100 UNIT/ML FLEXPEN: 70.0000 [IU] | PEN_INJECTOR | Freq: Every day | SUBCUTANEOUS | 2 refills | Status: DC

## 2018-01-26 MED ORDER — INSULIN LISPRO (1 UNIT DIAL) 100 UNIT/ML (KWIKPEN): 10.0000 [IU] | PEN_INJECTOR | Freq: Three times a day (TID) | SUBCUTANEOUS | 2 refills | Status: DC

## 2018-01-26 NOTE — Progress Notes (Signed)
Endocrinology  Follow up Note       01/26/2018, 1:22 PM   Subjective:    Patient ID: Alexander [name], male    DOB: 11-26-1965.  Alexander [name] is being seen in follow-up  for management of currently uncontrolled uncontrolled type 2 diabetes, hyperlipidemia, hypertension. PCP: Alexander Chyle, FNP.   Past Medical History:  Diagnosis Date  . Diabetes mellitus without complication (HCC)   . Diabetes mellitus, type II (HCC)   . Hyperlipidemia   . Hypertension   . Kidney stones   . Pneumococcal pneumonia (HCC) 2014  . Sleep apnea    Past Surgical History:  Procedure Laterality Date  . KIDNEY SURGERY    . LITHOTRIPSY    . REMOVAL OF GASTROSTOMY TUBE    . TRACHEOSTOMY CLOSURE     Social History   Socioeconomic History  . Marital status: Married    Spouse name: Not on file  . Number of children: Not on file  . Years of education: Not on file  . Highest education level: Not on file  Occupational History  . Not on file  Social Needs  . Financial resource strain: Not on file  . Food insecurity:    Worry: Not on file    Inability: Not on file  . Transportation needs:    Medical: Not on file    Non-medical: Not on file  Tobacco Use  . Smoking status: Current Every Day Smoker    Packs/day: 1.00    Years: 40.00    Pack years: 40.00    Types: Cigars, Cigarettes  . Smokeless tobacco: Never Used  Substance and Sexual Activity  . Alcohol use: [name]  . Drug use: Yes    Types: Marijuana  . Sexual activity: Not on file  Lifestyle  . Physical activity:    Days per week: Not on file    Minutes per session: Not on file  . Stress: Not on file  Relationships  . Social connections:    Talks on phone: Not on file    Gets together: Not on file    Attends religious service: Not on file    Active member of club or organization: Not on file    Attends meetings of clubs or organizations: Not on file     Relationship status: Not on file  Other Topics Concern  . Not on file  Social History Narrative  . Not on file   Outpatient Encounter Medications as of 01/26/2018  Medication Sig  . albuterol (PROVENTIL HFA;VENTOLIN HFA) 108 (90 Base) MCG/ACT inhaler Inhale into the lungs every 4 (four) hours as needed for wheezing or shortness of breath.  Marland Kitchen amLODipine (NORVASC) 10 MG tablet Take 10 mg by mouth daily.  Marland Kitchen atenolol (TENORMIN) 25 MG tablet daily.  Marland Kitchen atorvastatin (LIPITOR) 40 MG tablet Take 40 mg by mouth daily.  . DULoxetine HCl 60 MG CSDR daily.  . fluticasone furoate-vilanterol (BREO ELLIPTA) 200-25 MCG/INH AEPB Inhale 1 puff into the lungs daily.  . furosemide (LASIX) 20 MG tablet daily.  . hydrochlorothiazide (HYDRODIURIL) 25 MG tablet daily.  . Insulin Detemir (LEVEMIR FLEXTOUCH) 100 UNIT/ML  Pen Inject 70 Units into the skin at bedtime.  . insulin lispro (HUMALOG KWIKPEN) 100 UNIT/ML KwikPen Inject 0.1-0.16 mLs (10-16 Units total) into the skin 3 (three) times daily before meals.  . lamoTRIgine (LAMICTAL) 100 MG tablet 2 (two) times daily.  Marland Kitchen lisinopril (PRINIVIL,ZESTRIL) 20 MG tablet Take 20 mg by mouth daily.  . naproxen (NAPROSYN) 500 MG tablet Take 500 mg by mouth 2 (two) times daily with a meal.  . pantoprazole (PROTONIX) 40 MG tablet Take 40 mg by mouth daily.  . predniSONE (DELTASONE) 10 MG tablet Take 10 mg by mouth 2 (two) times daily with a meal.  . traZODone (DESYREL) 150 MG tablet Take by mouth at bedtime.  . [DISCONTINUED] Insulin Detemir (LEVEMIR FLEXTOUCH) 100 UNIT/ML Pen Inject 60 Units into the skin at bedtime.  . [DISCONTINUED] insulin lispro (HUMALOG KWIKPEN) 100 UNIT/ML KiwkPen Inject 10-16 Units into the skin 3 (three) times daily before meals.   [name] facility-administered encounter medications on file as of 01/26/2018.     ALLERGIES: Allergies  Allergen Reactions  . Invokana [Canagliflozin] Other (See Comments)    Yeast infection  . Metformin And Related   .  Pioglitazone     VACCINATION STATUS:  There is [name] immunization history on file for this patient.  Diabetes  He presents for his follow-up diabetic visit. He has type 2 diabetes mellitus. Onset time: He was diagnosed at approximate age of 29 years. His disease course has been improving. There are [name] hypoglycemic associated symptoms. Pertinent negatives for hypoglycemia include [name] confusion, headaches, pallor or seizures. Associated symptoms include polydipsia and polyuria. Pertinent negatives for diabetes include [name] chest pain, [name] fatigue, [name] polyphagia and [name] weakness. There are [name] hypoglycemic complications. Symptoms are improving. Diabetic complications include nephropathy and peripheral neuropathy. Risk factors for coronary artery disease include dyslipidemia, diabetes mellitus, family history, male sex, obesity, hypertension, sedentary lifestyle and tobacco exposure. Current diabetic treatment includes insulin injections (He is currently on Levemir 60 units twice daily, Humalog 5 units 3 times daily AC, glimepiride.). He is compliant with treatment some of the time. His weight is increasing steadily. He is following a generally unhealthy diet. When asked about meal planning, he reported none. He has not had a previous visit with a dietitian. He never participates in exercise. Blood glucose monitoring compliance is poor. His home blood glucose trend is decreasing steadily. (He did not bring any meter nor logs to review.  His A1c is 8.5%, slightly improving from 9.2%.   ) An ACE inhibitor/angiotensin II receptor blocker is being taken. Eye exam is current.  Hyperlipidemia  This is a chronic problem. The current episode started more than 1 year ago. The problem is uncontrolled. Exacerbating diseases include chronic renal disease, diabetes and obesity. Pertinent negatives include [name] chest pain, myalgias or shortness of breath. Risk factors for coronary artery disease include family history, dyslipidemia,  diabetes mellitus, hypertension, male sex, obesity and a sedentary lifestyle.  Hypertension  This is a chronic problem. The current episode started more than 1 year ago. The problem is uncontrolled. Pertinent negatives include [name] chest pain, headaches, neck pain, palpitations or shortness of breath. Risk factors for coronary artery disease include dyslipidemia, diabetes mellitus, male gender, obesity, sedentary lifestyle, smoking/tobacco exposure and family history. Past treatments include ACE inhibitors and calcium channel blockers. Identifiable causes of hypertension include chronic renal disease.     Review of Systems  Constitutional: Negative for chills, fatigue, fever and unexpected weight change.  HENT: Negative for dental problem,  mouth sores and trouble swallowing.   Eyes: Negative for visual disturbance.  Respiratory: Negative for cough, choking, chest tightness, shortness of breath and wheezing.   Cardiovascular: Negative for chest pain, palpitations and leg swelling.  Gastrointestinal: Negative for abdominal distention, abdominal pain, constipation, diarrhea, nausea and vomiting.  Endocrine: Positive for polydipsia and polyuria. Negative for polyphagia.  Genitourinary: Negative for dysuria, flank pain, hematuria and urgency.  Musculoskeletal: Positive for gait problem. Negative for back pain, myalgias and neck pain.  Skin: Negative for pallor, rash and wound.  Neurological: Negative for seizures, syncope, weakness, numbness and headaches.  Psychiatric/Behavioral: Negative for confusion and dysphoric mood.    Objective:    BP 126/76   Pulse 63   Ht 5\' 6"  (1.676 m)   Wt (!) 319 lb (144.7 kg)   BMI 51.49 kg/m   Wt Readings from Last 3 Encounters:  01/26/18 (!) 319 lb (144.7 kg)  09/05/17 (!) 313 lb (142 kg)  06/22/14 292 lb (132.5 kg)     Physical Exam  Constitutional: He is oriented to person, place, and time. He appears well-developed. He is cooperative. [name] distress.   Walks with a cane due to knee arthritis.  HENT:  Head: Normocephalic and atraumatic.  Eyes: EOM are normal.  Neck: Normal range of motion. Neck supple. [name] tracheal deviation present. [name] thyromegaly present.  Cardiovascular: Normal rate, S1 normal, S2 normal and normal heart sounds. Exam reveals [name] gallop.  [name] murmur heard. Pulses:      Dorsalis pedis pulses are 1+ on the right side, and 1+ on the left side.       Posterior tibial pulses are 1+ on the right side, and 1+ on the left side.  Pulmonary/Chest: Breath sounds normal. [name] respiratory distress. He has [name] wheezes.  Abdominal: Soft. Bowel sounds are normal. He exhibits [name] distension. There is [name] tenderness. There is [name] guarding and [name] CVA tenderness.  Musculoskeletal: He exhibits [name] edema.       Right shoulder: He exhibits [name] swelling and [name] deformity.  Neurological: He is alert and oriented to person, place, and time. He has normal strength and normal reflexes. [name] cranial nerve deficit or sensory deficit. Gait normal.  Skin: Skin is warm and dry. [name] rash noted. [name] cyanosis. Nails show [name] clubbing.  Psychiatric: He has a normal mood and affect. His speech is normal. Judgment normal. Cognition and memory are normal.  Patient has a reluctant affect.    CMP     Component Value Date/Time   NA 138 06/22/2014 2251   K 3.8 06/22/2014 2251   CL 101 06/22/2014 2251   CO2 26 06/22/2014 2251   GLUCOSE 252 (H) 06/22/2014 2251   BUN 16 01/02/2018   CREATININE 1.0 01/02/2018   CREATININE 0.79 06/22/2014 2251   CALCIUM 9.0 06/22/2014 2251   GFRNONAA >90 06/22/2014 2251   GFRAA >90 06/22/2014 2251    Diabetic Labs (most recent): Lab Results  Component Value Date   HGBA1C 8.5 01/02/2018   HGBA1C 9.2 06/21/2017     Lipid Panel ( most recent) Lipid Panel     Component Value Date/Time   CHOL 132 01/02/2018   TRIG 165 (A) 01/02/2018   HDL 35 01/02/2018   LDLCALC 66 01/02/2018      Lab Results  Component Value Date   TSH 1.30  03/17/2017      Assessment & Plan:   1. Type 2 diabetes mellitus with stage 2 chronic kidney disease, with long-term current use of  insulin (HCC)  - Alexander [name] has currently uncontrolled symptomatic type 2 DM since 52 years of age. -Patient returns with [name] meter nor logs.  His A1c is 8.5% slightly improving from 9.2%.  However he did not follow the insulin instructions given to him, used Levemir 60 units twice daily.  He did not bring his insulin administration logs to review.  -his diabetes is complicated by renal insufficiency, peripheral neuropathy, obesity/sedentary life , chronic heavy smoking, and Alexander [name] remains at extremely high risk for more acute and chronic complications which include CAD, CVA, CKD, retinopathy, and neuropathy. These are all discussed in detail with the patient.  - I have counseled him on diet management and weight loss, by adopting a carbohydrate restricted/protein rich diet. -He admits to dietary indiscretions including consumption of sweets and sweetened beverages.  -  Suggestion is made for him to avoid simple carbohydrates  from his diet including Cakes, Sweet Desserts / Pastries, Ice Cream, Soda (diet and regular), Sweet Tea, Candies, Chips, Cookies, Store Bought Juices, Alcohol in Excess of  1-2 drinks a day, Artificial Sweeteners, and "Sugar-free" Products. This will help patient to have stable blood glucose profile and potentially avoid unintended weight gain.  - I encouraged him to switch to  unprocessed or minimally processed complex starch and increased protein intake (animal or plant source), fruits, and vegetables.  - he is advised to stick to a routine mealtimes to eat 3 meals  a day and avoid unnecessary snacks ( to snack only to correct hypoglycemia).    - I have approached him with the following individualized plan to manage diabetes and patient reluctantly accepts.  He is alarmingly noncompliant.   -I approached him for readjusted  Levemir at 70 units nightly, continue  Humalog  10-16 units 3 times a day with meals  for pre-meal BG readings of 90-150mg /dl, plus patient specific correction dose for unexpected hyperglycemia above 150mg /dl, associated with strict monitoring of glucose 4 times a day-before meals and at bedtime. - Patient is warned not to take insulin without proper monitoring per orders. -Adjustment parameters are given for hypo and hyperglycemia in writing. -Patient is encouraged to call clinic for blood glucose levels less than 70 or above 300 mg /dl. -I approached him with option of adding Trulicity, patient declined saying that "it causes cancer ".  -Patient does not tolerate metformin. -Patient is not a candidate for GLP 2 inhibitors due to CKD.  - Patient specific target  A1c;  LDL, HDL, Triglycerides, and  Waist Circumference were discussed in detail.  2) BP/HTN: His blood pressure is controlled to target.  He is advised to continue current medications including lisinopril 20 mg p.o. daily along with amlodipine . 3) Lipids/HPL: His recent lipid panel showed controlled LDL at 66.  He is advised to continue his atorvastatin 40 mg p.o. Nightly.  4)  Weight/Diet: He missed his diabetes education appointment.  exercise, and detailed carbohydrates information provided.  5) Chronic Care/Health Maintenance:  -he  is on ACEI/ARB and Statin medications and  is encouraged to continue to follow up with Ophthalmology, Dentist,  Podiatrist at least yearly or according to recommendations, and advised to  Quit . patient has 50+-pack-year history of smoking.  I have recommended yearly flu vaccine and pneumonia vaccination at least every 5 years; moderate intensity exercise for up to 150 minutes weekly; and  sleep for at least 7 hours a day.  - I advised patient to maintain close follow up with Evelina Bucy,  Debby Freiberg, FNP for primary care needs.  - Time spent with the patient: 25 min, of which >50% was spent in reviewing his  blood glucose logs , discussing his hypo- and hyper-glycemic episodes, reviewing his current and  previous labs and insulin doses and developing a plan to avoid hypo- and hyper-glycemia. Please refer to Patient Instructions for Blood Glucose Monitoring and Insulin/Medications Dosing Guide"  in media tab for additional information. Pearla Dubonnet participated in the discussions, expressed understanding, and voiced agreement with the above plans.  All questions were answered to his satisfaction. he is encouraged to contact clinic should he have any questions or concerns prior to his return visit.  Follow up plan: -He will return in 5 weeks with repeat labs, meter and logs. Marquis Lunch, MD [facility] [address] [place], [place] [address] Phone: [phone]  Fax: [phone]    01/26/2018, 1:22 PM  This note was partially dictated with voice recognition software. Similar sounding words can be transcribed inadequately or may not  be corrected upon review.

## 2018-01-26 NOTE — Patient Instructions (Signed)

## 2018-02-28 ENCOUNTER — Other Ambulatory Visit: Payer: Self-pay

## 2018-02-28 MED ORDER — INSULIN LISPRO (1 UNIT DIAL) 100 UNIT/ML (KWIKPEN): 10.0000 [IU] | PEN_INJECTOR | Freq: Three times a day (TID) | SUBCUTANEOUS | 2 refills | Status: DC

## 2018-04-11 ENCOUNTER — Other Ambulatory Visit: Payer: Self-pay

## 2018-04-11 LAB — BASIC METABOLIC PANEL
BUN: 31 — AB (ref 4–21)
Creatinine: 1.3 (ref 0.6–1.3)

## 2018-04-11 MED ORDER — GLUCOSE BLOOD VI STRP: 1.0000 | ORAL_STRIP | Freq: Four times a day (QID) | 5 refills | Status: DC

## 2018-04-11 MED ORDER — LANCETS MISC: 1.0000 | Freq: Four times a day (QID) | 5 refills | Status: DC

## 2018-04-13 LAB — HEMOGLOBIN A1C: Hemoglobin A1C: 10.9

## 2018-05-01 ENCOUNTER — Ambulatory Visit (INDEPENDENT_AMBULATORY_CARE_PROVIDER_SITE_OTHER): Payer: 59 | Admitting: "Endocrinology

## 2018-05-01 ENCOUNTER — Encounter: Payer: Self-pay | Admitting: "Endocrinology

## 2018-05-01 VITALS — BP 120/73 | HR 65 | Ht 66.0 in | Wt 312.0 lb

## 2018-05-01 DIAGNOSIS — E1122 Type 2 diabetes mellitus with diabetic chronic kidney disease: Secondary | ICD-10-CM

## 2018-05-01 DIAGNOSIS — I10 Essential (primary) hypertension: Secondary | ICD-10-CM | POA: Diagnosis not present

## 2018-05-01 DIAGNOSIS — E782 Mixed hyperlipidemia: Secondary | ICD-10-CM | POA: Diagnosis not present

## 2018-05-01 DIAGNOSIS — N182 Chronic kidney disease, stage 2 (mild): Secondary | ICD-10-CM

## 2018-05-01 DIAGNOSIS — F17200 Nicotine dependence, unspecified, uncomplicated: Secondary | ICD-10-CM

## 2018-05-01 DIAGNOSIS — Z794 Long term (current) use of insulin: Secondary | ICD-10-CM

## 2018-05-01 MED ORDER — GLUCOSE BLOOD VI STRP: 1.0000 | ORAL_STRIP | Freq: Four times a day (QID) | 5 refills | Status: DC

## 2018-05-01 MED ORDER — INSULIN LISPRO (1 UNIT DIAL) 100 UNIT/ML (KWIKPEN): 15.0000 [IU] | PEN_INJECTOR | Freq: Three times a day (TID) | SUBCUTANEOUS | 2 refills | Status: DC

## 2018-05-01 NOTE — Patient Instructions (Signed)

## 2018-05-01 NOTE — Progress Notes (Signed)
Endocrinology  Follow up Note       05/01/2018, 1:22 PM   Subjective:    Patient ID: Alexander [name], male    DOB: 12-05-1965.  Alexander [name] is being seen in follow-up  for management of currently uncontrolled uncontrolled type 2 diabetes, hyperlipidemia, hypertension. PCP: Alita Chyle, FNP.   Past Medical History:  Diagnosis Date  . Diabetes mellitus without complication (HCC)   . Diabetes mellitus, type II (HCC)   . Hyperlipidemia   . Hypertension   . Kidney stones   . Pneumococcal pneumonia (HCC) 2014  . Sleep apnea    Past Surgical History:  Procedure Laterality Date  . KIDNEY SURGERY    . LITHOTRIPSY    . REMOVAL OF GASTROSTOMY TUBE    . TRACHEOSTOMY CLOSURE     Social History   Socioeconomic History  . Marital status: Married    Spouse name: Not on file  . Number of children: Not on file  . Years of education: Not on file  . Highest education level: Not on file  Occupational History  . Not on file  Social Needs  . Financial resource strain: Not on file  . Food insecurity:    Worry: Not on file    Inability: Not on file  . Transportation needs:    Medical: Not on file    Non-medical: Not on file  Tobacco Use  . Smoking status: Current Every Day Smoker    Packs/day: 1.00    Years: 40.00    Pack years: 40.00    Types: Cigars, Cigarettes  . Smokeless tobacco: Never Used  Substance and Sexual Activity  . Alcohol use: No  . Drug use: Yes    Types: Marijuana  . Sexual activity: Not on file  Lifestyle  . Physical activity:    Days per week: Not on file    Minutes per session: Not on file  . Stress: Not on file  Relationships  . Social connections:    Talks on phone: Not on file    Gets together: Not on file    Attends religious service: Not on file    Active member of club or organization: Not on file    Attends meetings of clubs or organizations: Not on file     Relationship status: Not on file  Other Topics Concern  . Not on file  Social History Narrative  . Not on file   Outpatient Encounter Medications as of 05/01/2018  Medication Sig  . [DISCONTINUED] canagliflozin (INVOKANA) 100 MG TABS tablet Take by mouth daily before breakfast.  . albuterol (PROVENTIL HFA;VENTOLIN HFA) 108 (90 Base) MCG/ACT inhaler Inhale into the lungs every 4 (four) hours as needed for wheezing or shortness of breath.  Marland Kitchen amLODipine (NORVASC) 10 MG tablet Take 10 mg by mouth daily.  Marland Kitchen atenolol (TENORMIN) 25 MG tablet daily.  Marland Kitchen atorvastatin (LIPITOR) 40 MG tablet Take 40 mg by mouth daily.  . DULoxetine HCl 60 MG CSDR daily.  . fluticasone furoate-vilanterol (BREO ELLIPTA) 200-25 MCG/INH AEPB Inhale 1 puff into the lungs daily.  . furosemide (LASIX) 20 MG tablet daily.  Marland Kitchen  glucose blood test strip 1 each by Other route 4 (four) times daily. Use as instructed qid. EZ Max test strips. E11.65  . hydrochlorothiazide (HYDRODIURIL) 25 MG tablet daily.  . Insulin Detemir (LEVEMIR FLEXTOUCH) 100 UNIT/ML Pen Inject 70 Units into the skin at bedtime.  . insulin lispro (HUMALOG KWIKPEN) 100 UNIT/ML KwikPen Inject 0.15-0.21 mLs (15-21 Units total) into the skin 3 (three) times daily before meals.  . lamoTRIgine (LAMICTAL) 100 MG tablet 200 mg 2 (two) times daily.   . Lancets MISC 1 each by Does not apply route 4 (four) times daily. Use as instructed qid. Pt has an EZ Max glucometer  . lisinopril (PRINIVIL,ZESTRIL) 20 MG tablet Take 20 mg by mouth daily.  . predniSONE (DELTASONE) 10 MG tablet Take 10 mg by mouth 2 (two) times daily with a meal.  . traZODone (DESYREL) 150 MG tablet Take by mouth at bedtime.  . [DISCONTINUED] glucose blood test strip 1 each by Other route 4 (four) times daily. Use as instructed qid. EZ Max test strips. E11.65  . [DISCONTINUED] insulin lispro (HUMALOG KWIKPEN) 100 UNIT/ML KwikPen Inject 0.1-0.16 mLs (10-16 Units total) into the skin 3 (three) times daily  before meals. (Patient taking differently: Inject 10 Units into the skin 3 (three) times daily before meals. )  . [DISCONTINUED] naproxen (NAPROSYN) 500 MG tablet Take 500 mg by mouth 2 (two) times daily with a meal.  . [DISCONTINUED] pantoprazole (PROTONIX) 40 MG tablet Take 40 mg by mouth daily.   No facility-administered encounter medications on file as of 05/01/2018.     ALLERGIES: Allergies  Allergen Reactions  . Invokana [Canagliflozin] Other (See Comments)    Yeast infection  . Metformin And Related   . Pioglitazone     VACCINATION STATUS:  There is no immunization history on file for this patient.  Diabetes  He presents for his follow-up diabetic visit. He has type 2 diabetes mellitus. Onset time: He was diagnosed at approximate age of 29 years. His disease course has been worsening. There are no hypoglycemic associated symptoms. Pertinent negatives for hypoglycemia include no confusion, headaches, pallor or seizures. Associated symptoms include polydipsia and polyuria. Pertinent negatives for diabetes include no chest pain, no fatigue, no polyphagia and no weakness. There are no hypoglycemic complications. Symptoms are worsening. Diabetic complications include nephropathy and peripheral neuropathy. Risk factors for coronary artery disease include dyslipidemia, diabetes mellitus, family history, male sex, obesity, hypertension, sedentary lifestyle and tobacco exposure. Current diabetic treatment includes insulin injections (He is currently on Levemir 60 units twice daily, Humalog 5 units 3 times daily AC, glimepiride.). He is compliant with treatment some of the time. His weight is fluctuating minimally. He is following a generally unhealthy diet. When asked about meal planning, he reported none. He has not had a previous visit with a dietitian. He never participates in exercise. Blood glucose monitoring compliance is poor. His home blood glucose trend is decreasing steadily. His overall  blood glucose range is >200 mg/dl. (He did not bring any logs to review.  His meter shows random and rare monitoring averaging 261 for the last 30 days.  His A1c is 10.9% increasing from 8.5% during his last visit.   ) An ACE inhibitor/angiotensin II receptor blocker is being taken. Eye exam is current.  Hyperlipidemia  This is a chronic problem. The current episode started more than 1 year ago. The problem is uncontrolled. Exacerbating diseases include chronic renal disease, diabetes and obesity. Pertinent negatives include no chest pain, myalgias or shortness  of breath. Risk factors for coronary artery disease include family history, dyslipidemia, diabetes mellitus, hypertension, male sex, obesity and a sedentary lifestyle.  Hypertension  This is a chronic problem. The current episode started more than 1 year ago. The problem is uncontrolled. Pertinent negatives include no chest pain, headaches, neck pain, palpitations or shortness of breath. Risk factors for coronary artery disease include dyslipidemia, diabetes mellitus, male gender, obesity, sedentary lifestyle, smoking/tobacco exposure and family history. Past treatments include ACE inhibitors and calcium channel blockers. Identifiable causes of hypertension include chronic renal disease.    Review of Systems  Constitutional: Negative for chills, fatigue, fever and unexpected weight change.  HENT: Negative for dental problem, mouth sores and trouble swallowing.   Eyes: Negative for visual disturbance.  Respiratory: Negative for cough, choking, chest tightness, shortness of breath and wheezing.   Cardiovascular: Negative for chest pain, palpitations and leg swelling.  Gastrointestinal: Negative for abdominal distention, abdominal pain, constipation, diarrhea, nausea and vomiting.  Endocrine: Positive for polydipsia and polyuria. Negative for polyphagia.  Genitourinary: Negative for dysuria, flank pain, hematuria and urgency.  Musculoskeletal:  Positive for gait problem. Negative for back pain, myalgias and neck pain.  Skin: Negative for pallor, rash and wound.  Neurological: Negative for seizures, syncope, weakness, numbness and headaches.  Psychiatric/Behavioral: Negative for confusion and dysphoric mood.    Objective:    BP 120/73   Pulse 65   Ht 5\' 6"  (1.676 m)   Wt (!) 312 lb (141.5 kg)   BMI 50.36 kg/m   Wt Readings from Last 3 Encounters:  05/01/18 (!) 312 lb (141.5 kg)  01/26/18 (!) 319 lb (144.7 kg)  09/05/17 (!) 313 lb (142 kg)     Physical Exam Constitutional:      General: He is not in acute distress.    Appearance: He is well-developed.     Comments: Walks with a cane due to knee arthritis.  HENT:     Head: Normocephalic and atraumatic.  Neck:     Musculoskeletal: Normal range of motion and neck supple.     Thyroid: No thyromegaly.     Trachea: No tracheal deviation.  Cardiovascular:     Rate and Rhythm: Normal rate.     Pulses:          Dorsalis pedis pulses are 1+ on the right side and 1+ on the left side.       Posterior tibial pulses are 1+ on the right side and 1+ on the left side.     Heart sounds: Normal heart sounds, S1 normal and S2 normal. No murmur. No gallop.   Pulmonary:     Effort: No respiratory distress.     Breath sounds: Normal breath sounds. No wheezing.  Abdominal:     General: Bowel sounds are normal. There is no distension.     Palpations: Abdomen is soft.     Tenderness: There is no abdominal tenderness. There is no guarding.  Musculoskeletal:     Right shoulder: He exhibits no swelling and no deformity.  Skin:    General: Skin is warm and dry.     Findings: No rash.     Nails: There is no clubbing.   Neurological:     Mental Status: He is alert and oriented to person, place, and time.     Cranial Nerves: No cranial nerve deficit.     Sensory: No sensory deficit.     Gait: Gait normal.     Deep Tendon Reflexes: Reflexes are  normal and symmetric.  Psychiatric:         Speech: Speech normal.        Behavior: Behavior is cooperative.        Judgment: Judgment normal.     Comments: Patient has a reluctant affect.    CMP     Component Value Date/Time   NA 138 06/22/2014 2251   K 3.8 06/22/2014 2251   CL 101 06/22/2014 2251   CO2 26 06/22/2014 2251   GLUCOSE 252 (H) 06/22/2014 2251   BUN 31 (A) 04/11/2018   CREATININE 1.3 04/11/2018   CREATININE 0.79 06/22/2014 2251   CALCIUM 9.0 06/22/2014 2251   GFRNONAA >90 06/22/2014 2251   GFRAA >90 06/22/2014 2251    Diabetic Labs (most recent): Lab Results  Component Value Date   HGBA1C 10.9 04/11/2018   HGBA1C 8.5 01/02/2018   HGBA1C 9.2 06/21/2017     Lipid Panel ( most recent) Lipid Panel     Component Value Date/Time   CHOL 132 01/02/2018   TRIG 165 (A) 01/02/2018   HDL 35 01/02/2018   LDLCALC 66 01/02/2018      Lab Results  Component Value Date   TSH 1.30 03/17/2017      Assessment & Plan:   1. Type 2 diabetes mellitus with stage 2 chronic kidney disease, with long-term current use of insulin (HCC)  - Alexander [name] has currently uncontrolled symptomatic type 2 DM since 52 years of age. -Patient returns with blood glucose logs.  His meter shows rare and random monitoring averaging 261.  His A1c is 10.9% increasing from 8.5%.    He did not bring his insulin administration logs to review.  -his diabetes is complicated by renal insufficiency, peripheral neuropathy, obesity/sedentary life , chronic heavy smoking, and Alexander [name] remains at extremely high risk for more acute and chronic complications which include CAD, CVA, CKD, retinopathy, and neuropathy. These are all discussed in detail with the patient.  - I have counseled him on diet management and weight loss, by adopting a carbohydrate restricted/protein rich diet. -He admits to dietary indiscretions including consumption of sweets and sweetened beverages.  - Patient admits there is a room for improvement in his diet and  drink choices. -  Suggestion is made for him to avoid simple carbohydrates  from his diet including Cakes, Sweet Desserts / Pastries, Ice Cream, Soda (diet and regular), Sweet Tea, Candies, Chips, Cookies, Store Bought Juices, Alcohol in Excess of  1-2 drinks a day, Artificial Sweeteners, and "Sugar-free" Products. This will help patient to have stable blood glucose profile and potentially avoid unintended weight gain.  - I encouraged him to switch to  unprocessed or minimally processed complex starch and increased protein intake (animal or plant source), fruits, and vegetables.  - he is advised to stick to a routine mealtimes to eat 3 meals  a day and avoid unnecessary snacks ( to snack only to correct hypoglycemia).    - I have approached him with the following individualized plan to manage diabetes and patient reluctantly accepts.  Presents with no logs and admits that he does not monitor regularly and does not cover most of his meals with insulin.  He remains to be alarmingly noncompliant patient.   -He will continue to require intensive treatment with basal/bolus insulin in order for him to achieve and maintain control of diabetes to target.   -He is advised to continue Levemir  70 units nightly, increase Humalog to 15 -21 units 3  times a day with meals  for pre-meal BG readings of 90-150mg /dl, plus patient specific correction dose for unexpected hyperglycemia above 150mg /dl, associated with strict monitoring of glucose 4 times a day-before meals and at bedtime. - Patient is warned not to take insulin without proper monitoring per orders. -Adjustment parameters are given for hypo and hyperglycemia in writing. -Patient is encouraged to call clinic for blood glucose levels less than 70 or above 300 mg /dl. -I approached him with option of adding Trulicity, patient declined saying that "it causes cancer ".  -Patient does not tolerate metformin. -Patient is not a candidate for GLP 2 inhibitors due  to CKD.  - Patient specific target  A1c;  LDL, HDL, Triglycerides, and  Waist Circumference were discussed in detail.  2) BP/HTN: His blood pressure is controlled to target.    He is advised to continue current medications including lisinopril 20 mg p.o. daily along with amlodipine .  3) Lipids/HPL: His recent lipid panel showed controlled LDL at 66.  He is advised to continue atorvastatin 40 mg p.o. nightly.  4)  Weight/Diet: He missed his diabetes education appointment.  exercise, and detailed carbohydrates information provided.  5) Chronic Care/Health Maintenance:  -he  is on ACEI/ARB and Statin medications and  is encouraged to continue to follow up with Ophthalmology, Dentist,  Podiatrist at least yearly or according to recommendations, and advised to  Quit . patient has 50+-pack-year history of smoking.  I have recommended yearly flu vaccine and pneumonia vaccination at least every 5 years; moderate intensity exercise for up to 150 minutes weekly; and  sleep for at least 7 hours a day.  - I advised patient to maintain close follow up with Alita ChyleVarner, Gina L, FNP for primary care needs.  - Time spent with the patient: 25 min, of which >50% was spent in reviewing his blood glucose logs , discussing his hypoglycemia and hyperglycemia episodes, reviewing his current and  previous labs / studies and medications  doses and developing a plan to avoid hypoglycemia and hyperglycemia. Please refer to Patient Instructions for Blood Glucose Monitoring and Insulin/Medications Dosing Guide"  in media tab for additional information. Pearla DubonnetLarry W Neyland participated in the discussions, expressed understanding, and voiced agreement with the above plans.  All questions were answered to his satisfaction. he is encouraged to contact clinic should he have any questions or concerns prior to his return visit.   Follow up plan: -He will return in 5 weeks with repeat labs, meter and logs. Marquis LunchGebre Tyannah Sane, MD [facility] [address] [place], [place] [address] Phone: [phone]  Fax: [phone]    05/01/2018, 1:22 PM  This note was partially dictated with voice recognition software. Similar sounding words can be transcribed inadequately or may not  be corrected upon review.

## 2018-06-12 ENCOUNTER — Other Ambulatory Visit: Payer: Self-pay

## 2018-06-12 MED ORDER — INSULIN LISPRO (1 UNIT DIAL) 100 UNIT/ML (KWIKPEN): 15.0000 [IU] | PEN_INJECTOR | Freq: Three times a day (TID) | SUBCUTANEOUS | 0 refills | Status: DC

## 2018-06-12 MED ORDER — INSULIN DETEMIR 100 UNIT/ML FLEXPEN: 70.0000 [IU] | PEN_INJECTOR | Freq: Every day | SUBCUTANEOUS | 0 refills | Status: DC

## 2018-08-01 ENCOUNTER — Ambulatory Visit: Payer: 59 | Admitting: "Endocrinology

## 2018-08-15 ENCOUNTER — Encounter (INDEPENDENT_AMBULATORY_CARE_PROVIDER_SITE_OTHER): Payer: Self-pay

## 2018-08-15 ENCOUNTER — Other Ambulatory Visit: Payer: Self-pay

## 2018-08-15 ENCOUNTER — Encounter: Payer: Self-pay | Admitting: "Endocrinology

## 2018-08-15 ENCOUNTER — Ambulatory Visit (INDEPENDENT_AMBULATORY_CARE_PROVIDER_SITE_OTHER): Payer: 59 | Admitting: "Endocrinology

## 2018-08-15 VITALS — BP 106/66 | HR 75 | Ht 66.0 in | Wt 308.0 lb

## 2018-08-15 DIAGNOSIS — I10 Essential (primary) hypertension: Secondary | ICD-10-CM

## 2018-08-15 DIAGNOSIS — E782 Mixed hyperlipidemia: Secondary | ICD-10-CM | POA: Diagnosis not present

## 2018-08-15 DIAGNOSIS — E1122 Type 2 diabetes mellitus with diabetic chronic kidney disease: Secondary | ICD-10-CM

## 2018-08-15 DIAGNOSIS — N182 Chronic kidney disease, stage 2 (mild): Secondary | ICD-10-CM | POA: Diagnosis not present

## 2018-08-15 DIAGNOSIS — Z794 Long term (current) use of insulin: Secondary | ICD-10-CM

## 2018-08-15 MED ORDER — INSULIN DETEMIR 100 UNIT/ML FLEXPEN: 80.0000 [IU] | PEN_INJECTOR | Freq: Every day | SUBCUTANEOUS | 0 refills | Status: DC

## 2018-08-15 NOTE — Patient Instructions (Signed)

## 2018-08-15 NOTE — Progress Notes (Signed)
Endocrinology  Follow up Note       08/15/2018, 1:38 PM   Subjective:    Patient ID: Alexander [name], male    DOB: [birth year].  Alexander [name] is being seen in follow-up  for management of currently uncontrolled uncontrolled type 2 diabetes, hyperlipidemia, hypertension. PCP: Alita Chyle, FNP.   Past Medical History:  Diagnosis Date  . Diabetes mellitus without complication (HCC)   . Diabetes mellitus, type II (HCC)   . Hyperlipidemia   . Hypertension   . Kidney stones   . Pneumococcal pneumonia (HCC) 2014  . Sleep apnea    Past Surgical History:  Procedure Laterality Date  . KIDNEY SURGERY    . LITHOTRIPSY    . REMOVAL OF GASTROSTOMY TUBE    . TRACHEOSTOMY CLOSURE     Social History   Socioeconomic History  . Marital status: Married    Spouse name: Not on file  . Number of children: Not on file  . Years of education: Not on file  . Highest education level: Not on file  Occupational History  . Not on file  Social Needs  . Financial resource strain: Not on file  . Food insecurity:    Worry: Not on file    Inability: Not on file  . Transportation needs:    Medical: Not on file    Non-medical: Not on file  Tobacco Use  . Smoking status: Current Every Day Smoker    Packs/day: 1.00    Years: 40.00    Pack years: 40.00    Types: Cigars, Cigarettes  . Smokeless tobacco: Never Used  Substance and Sexual Activity  . Alcohol use: No  . Drug use: Yes    Types: Marijuana  . Sexual activity: Not on file  Lifestyle  . Physical activity:    Days per week: Not on file    Minutes per session: Not on file  . Stress: Not on file  Relationships  . Social connections:    Talks on phone: Not on file    Gets together: Not on file    Attends religious service: Not on file    Active member of club or organization: Not on file    Attends meetings of clubs or organizations: Not on file     Relationship status: Not on file  Other Topics Concern  . Not on file  Social History Narrative  . Not on file   Outpatient Encounter Medications as of 08/15/2018  Medication Sig  . albuterol (PROVENTIL HFA;VENTOLIN HFA) 108 (90 Base) MCG/ACT inhaler Inhale into the lungs every 4 (four) hours as needed for wheezing or shortness of breath.  Marland Kitchen amLODipine (NORVASC) 10 MG tablet Take 10 mg by mouth daily.  Marland Kitchen atenolol (TENORMIN) 25 MG tablet daily.  Marland Kitchen atorvastatin (LIPITOR) 40 MG tablet Take 40 mg by mouth daily.  . DULoxetine HCl 60 MG CSDR daily.  . fluticasone furoate-vilanterol (BREO ELLIPTA) 200-25 MCG/INH AEPB Inhale 1 puff into the lungs daily.  . furosemide (LASIX) 20 MG tablet daily.  Marland Kitchen glucose blood test strip 1 each by Other route 4 (four) times daily. Use  as instructed qid. EZ Max test strips. E11.65  . hydrochlorothiazide (HYDRODIURIL) 25 MG tablet daily.  . Insulin Detemir (LEVEMIR FLEXTOUCH) 100 UNIT/ML Pen Inject 80 Units into the skin at bedtime.  . insulin lispro (HUMALOG KWIKPEN) 100 UNIT/ML KwikPen Inject 0.15-0.21 mLs (15-21 Units total) into the skin 3 (three) times daily before meals.  . lamoTRIgine (LAMICTAL) 100 MG tablet 200 mg 2 (two) times daily.   . Lancets MISC 1 each by Does not apply route 4 (four) times daily. Use as instructed qid. Pt has an EZ Max glucometer  . lisinopril (PRINIVIL,ZESTRIL) 20 MG tablet Take 20 mg by mouth daily.  . predniSONE (DELTASONE) 10 MG tablet Take 10 mg by mouth 2 (two) times daily with a meal.  . traZODone (DESYREL) 150 MG tablet Take by mouth at bedtime.  . [DISCONTINUED] Insulin Detemir (LEVEMIR FLEXTOUCH) 100 UNIT/ML Pen Inject 70 Units into the skin at bedtime.   No facility-administered encounter medications on file as of 08/15/2018.     ALLERGIES: Allergies  Allergen Reactions  . Invokana [Canagliflozin] Other (See Comments)    Yeast infection  . Metformin And Related   . Pioglitazone     VACCINATION STATUS:  There  is no immunization history on file for this patient.  Diabetes  He presents for his follow-up diabetic visit. He has type 2 diabetes mellitus. Onset time: He was diagnosed at approximate age of 29 years. His disease course has been worsening. There are no hypoglycemic associated symptoms. Pertinent negatives for hypoglycemia include no confusion, headaches, pallor or seizures. Associated symptoms include polydipsia and polyuria. Pertinent negatives for diabetes include no chest pain, no fatigue, no polyphagia and no weakness. There are no hypoglycemic complications. Symptoms are worsening. Diabetic complications include nephropathy and peripheral neuropathy. Risk factors for coronary artery disease include dyslipidemia, diabetes mellitus, family history, male sex, obesity, hypertension, sedentary lifestyle and tobacco exposure. Current diabetic treatment includes insulin injections (He is currently on Levemir 60 units twice daily, Humalog 5 units 3 times daily AC, glimepiride.). He is compliant with treatment some of the time. His weight is fluctuating minimally. He is following a generally unhealthy diet. When asked about meal planning, he reported none. He has not had a previous visit with a dietitian. He never participates in exercise. Blood glucose monitoring compliance is poor. His home blood glucose trend is decreasing steadily. His breakfast blood glucose range is generally >200 mg/dl. His lunch blood glucose range is generally 180-200 mg/dl. His dinner blood glucose range is generally 180-200 mg/dl. His bedtime blood glucose range is generally 180-200 mg/dl. His overall blood glucose range is >200 mg/dl. ( ) An ACE inhibitor/angiotensin II receptor blocker is being taken. Eye exam is current.  Hyperlipidemia  This is a chronic problem. The current episode started more than 1 year ago. The problem is uncontrolled. Exacerbating diseases include chronic renal disease, diabetes and obesity. Pertinent  negatives include no chest pain, myalgias or shortness of breath. Risk factors for coronary artery disease include family history, dyslipidemia, diabetes mellitus, hypertension, male sex, obesity and a sedentary lifestyle.  Hypertension  This is a chronic problem. The current episode started more than 1 year ago. The problem is uncontrolled. Pertinent negatives include no chest pain, headaches, neck pain, palpitations or shortness of breath. Risk factors for coronary artery disease include dyslipidemia, diabetes mellitus, male gender, obesity, sedentary lifestyle, smoking/tobacco exposure and family history. Past treatments include ACE inhibitors and calcium channel blockers. Identifiable causes of hypertension include chronic renal disease.  Review of Systems  Constitutional: Negative for chills, fatigue, fever and unexpected weight change.  HENT: Negative for dental problem, mouth sores and trouble swallowing.   Eyes: Negative for visual disturbance.  Respiratory: Negative for cough, choking, chest tightness, shortness of breath and wheezing.   Cardiovascular: Negative for chest pain, palpitations and leg swelling.  Gastrointestinal: Negative for abdominal distention, abdominal pain, constipation, diarrhea, nausea and vomiting.  Endocrine: Positive for polydipsia and polyuria. Negative for polyphagia.  Genitourinary: Negative for dysuria, flank pain, hematuria and urgency.  Musculoskeletal: Positive for gait problem. Negative for back pain, myalgias and neck pain.  Skin: Negative for pallor, rash and wound.  Neurological: Negative for seizures, syncope, weakness, numbness and headaches.  Psychiatric/Behavioral: Negative for confusion and dysphoric mood.    Objective:    BP 106/66   Pulse 75   Ht  (1.676 m)   Wt (!) 308 lb (139.7 kg)   BMI 49.71 kg/m   Wt Readings from Last 3 Encounters:  08/15/18 (!) 308 lb (139.7 kg)  05/01/18 (!) 312 lb (141.5 kg)  01/26/18 (!) 319 lb (144.7  kg)     Physical Exam Constitutional:      General: He is not in acute distress.    Appearance: He is well-developed.     Comments: Walks with a cane due to knee arthritis.  HENT:     Head: Normocephalic and atraumatic.  Neck:     Musculoskeletal: Normal range of motion and neck supple.     Thyroid: No thyromegaly.     Trachea: No tracheal deviation.  Cardiovascular:     Pulses:          Dorsalis pedis pulses are 1+ on the right side and 1+ on the left side.       Posterior tibial pulses are 1+ on the right side and 1+ on the left side.     Heart sounds: S1 normal and S2 normal. No murmur. No gallop.   Pulmonary:     Effort: Pulmonary effort is normal. No respiratory distress.     Breath sounds: No wheezing.  Abdominal:     General: There is no distension.     Tenderness: There is no abdominal tenderness. There is no guarding.  Musculoskeletal:     Right shoulder: He exhibits no swelling and no deformity.  Skin:    General: Skin is warm and dry.     Findings: No rash.     Nails: There is no clubbing.   Neurological:     Mental Status: He is alert and oriented to person, place, and time.     Cranial Nerves: No cranial nerve deficit.     Sensory: No sensory deficit.     Gait: Gait normal.     Deep Tendon Reflexes: Reflexes are normal and symmetric.  Psychiatric:        Speech: Speech normal.        Behavior: Behavior is cooperative.        Judgment: Judgment normal.     Comments: Patient has a reluctant affect.    CMP     Component Value Date/Time   NA 138 06/22/2014 2251   K 3.8 06/22/2014 2251   CL 101 06/22/2014 2251   CO2 26 06/22/2014 2251   GLUCOSE 252 (H) 06/22/2014 2251   BUN 31 (A) 04/11/2018   CREATININE 1.3 04/11/2018   CREATININE 0.79 06/22/2014 2251   CALCIUM 9.0 06/22/2014 2251   GFRNONAA >90 06/22/2014 2251   GFRAA >  90 06/22/2014 2251    Diabetic Labs (most recent): Lab Results  Component Value Date   HGBA1C 10.9 04/11/2018   HGBA1C 8.5  01/02/2018   HGBA1C 9.2 06/21/2017     Lipid Panel ( most recent) Lipid Panel     Component Value Date/Time   CHOL 132 01/02/2018   TRIG 165 (A) 01/02/2018   HDL 35 01/02/2018   LDLCALC 66 01/02/2018      Lab Results  Component Value Date   TSH 1.30 03/17/2017      Assessment & Plan:   1. Type 2 diabetes mellitus with stage 2 chronic kidney disease, with long-term current use of insulin (HCC)  - Alexander [name] has currently uncontrolled symptomatic type 2 DM since 53 years of age. -Patient returns with blood glucose logs, showing persistently above target glycemic profile especially at fasting.  He did not do his previsit labs for A1c. -He is last A1c was 10.9% prior to his last visit.  -his diabetes is complicated by renal insufficiency, peripheral neuropathy, obesity/sedentary life , chronic heavy smoking, and Alexander [name] remains at extremely high risk for more acute and chronic complications which include CAD, CVA, CKD, retinopathy, and neuropathy. These are all discussed in detail with the patient.  - I have counseled him on diet management and weight loss, by adopting a carbohydrate restricted/protein rich diet. -He admits to dietary indiscretions including consumption of sweets and sweetened beverages.  - Patient admits there is a room for improvement in his diet and drink choices. -  Suggestion is made for him to avoid simple carbohydrates  from his diet including Cakes, Sweet Desserts / Pastries, Ice Cream, Soda (diet and regular), Sweet Tea, Candies, Chips, Cookies, Store Bought Juices, Alcohol in Excess of  1-2 drinks a day, Artificial Sweeteners, and "Sugar-free" Products. This will help patient to have stable blood glucose profile and potentially avoid unintended weight gain.   - I encouraged him to switch to  unprocessed or minimally processed complex starch and increased protein intake (animal or plant source), fruits, and vegetables.  - he is advised to  stick to a routine mealtimes to eat 3 meals  a day and avoid unnecessary snacks ( to snack only to correct hypoglycemia).    - I have approached him with the following individualized plan to manage diabetes and patient reluctantly accepts.   -He will continue to require intensive treatment with basal/bolus insulin in order for him to achieve and maintain control of diabetes to target.   -He is advised to increase his Levemir to 80 units nightly, continue Humalog 15-21 units 3 times a day with meals  for pre-meal BG readings of 90-150mg /dl, plus patient specific correction dose for unexpected hyperglycemia above 150mg /dl, associated with strict monitoring of glucose 4 times a day-before meals and at bedtime. - Patient is warned not to take insulin without proper monitoring per orders. -Adjustment parameters are given for hypo and hyperglycemia in writing. -Patient is encouraged to call clinic for blood glucose levels less than 70 or above 300 mg /dl. -I approached him with option of adding Trulicity, patient declined saying that "it causes cancer ".  -Patient does not tolerate metformin. -Patient is not a candidate for GLP 2 inhibitors due to CKD.  - Patient specific target  A1c;  LDL, HDL, Triglycerides, and  Waist Circumference were discussed in detail.  2) BP/HTN: His blood pressure is controlled to target.    He is advised to continue current medications including lisinopril  20 mg p.o. daily along with amlodipine .  3) Lipids/HPL: His recent lipid panel showed controlled LDL at 66.  He is advised to continue atorvastatin 40 mg p.o. nightly.   4)  Weight/Diet: He missed his diabetes education appointment.  exercise, and detailed carbohydrates information provided.  5) Chronic Care/Health Maintenance:  -he  is on ACEI/ARB and Statin medications and  is encouraged to continue to follow up with Ophthalmology, Dentist,  Podiatrist at least yearly or according to recommendations, and advised to   Quit . patient has 50+-pack-year history of smoking.  I have recommended yearly flu vaccine and pneumonia vaccination at least every 5 years; moderate intensity exercise for up to 150 minutes weekly; and  sleep for at least 7 hours a day.  - I advised patient to maintain close follow up with Alita Chyle, FNP for primary care needs.  - Time spent with the patient: 25 min, of which >50% was spent in reviewing his blood glucose logs , discussing his hypoglycemia and hyperglycemia episodes, reviewing his current and  previous labs / studies and medications  doses and developing a plan to avoid hypoglycemia and hyperglycemia. Please refer to Patient Instructions for Blood Glucose Monitoring and Insulin/Medications Dosing Guide"  in media tab for additional information. Please  also refer to " Patient Self Inventory" in the Media  tab for reviewed elements of pertinent patient history.  Pearla Dubonnet participated in the discussions, expressed understanding, and voiced agreement with the above plans.  All questions were answered to his satisfaction. he is encouraged to contact clinic should he have any questions or concerns prior to his return visit.    Follow up plan: -He will return in 5 weeks with repeat labs, meter and logs. Marquis Lunch, MD [facility] [address] [place], [place] [address] Phone: [phone]  Fax: [phone]    08/15/2018, 1:38 PM  This note was partially dictated with voice recognition software. Similar sounding words can be transcribed inadequately or may not  be corrected upon review.

## 2018-11-15 ENCOUNTER — Ambulatory Visit: Payer: 59 | Admitting: "Endocrinology

## 2018-11-16 ENCOUNTER — Telehealth: Payer: Self-pay | Admitting: "Endocrinology

## 2018-11-16 NOTE — Telephone Encounter (Signed)
That is right, he is not a suitable candidate for Invokana and I suggest he stop it.

## 2018-11-16 NOTE — Telephone Encounter (Signed)
Pt needs refill on canagliflozin (INVOKANA) 100 MG TABS

## 2018-11-16 NOTE — Telephone Encounter (Signed)
FYI    P

## 2018-11-16 NOTE — Telephone Encounter (Signed)
FYI Pt is calling requesting Invokana. Another physician has prescribed this. I advised him DR Dorris Fetch would not refill. He is doing labs this week and has OV on 9/2

## 2018-11-16 NOTE — Telephone Encounter (Signed)
Pt.notified

## 2018-11-22 ENCOUNTER — Ambulatory Visit: Payer: 59 | Admitting: "Endocrinology

## 2018-12-04 LAB — HEPATIC FUNCTION PANEL
ALT: 19 (ref 10–40)
AST: 18 (ref 14–40)
Alkaline Phosphatase: 161 — AB (ref 25–125)
Bilirubin, Total: 0.9

## 2018-12-04 LAB — BASIC METABOLIC PANEL
BUN: 24 — AB (ref 4–21)
Creatinine: 1.2 (ref 0.6–1.3)
Glucose: 152
Potassium: 3.4 (ref 3.4–5.3)

## 2018-12-04 LAB — HEMOGLOBIN A1C: Hemoglobin A1C: 6.2

## 2018-12-06 ENCOUNTER — Ambulatory Visit (INDEPENDENT_AMBULATORY_CARE_PROVIDER_SITE_OTHER): Payer: 59 | Admitting: "Endocrinology

## 2018-12-06 ENCOUNTER — Encounter: Payer: Self-pay | Admitting: "Endocrinology

## 2018-12-06 ENCOUNTER — Other Ambulatory Visit: Payer: Self-pay

## 2018-12-06 DIAGNOSIS — E1122 Type 2 diabetes mellitus with diabetic chronic kidney disease: Secondary | ICD-10-CM

## 2018-12-06 DIAGNOSIS — N182 Chronic kidney disease, stage 2 (mild): Secondary | ICD-10-CM | POA: Diagnosis not present

## 2018-12-06 DIAGNOSIS — E782 Mixed hyperlipidemia: Secondary | ICD-10-CM | POA: Diagnosis not present

## 2018-12-06 DIAGNOSIS — I10 Essential (primary) hypertension: Secondary | ICD-10-CM | POA: Diagnosis not present

## 2018-12-06 DIAGNOSIS — Z794 Long term (current) use of insulin: Secondary | ICD-10-CM

## 2018-12-06 MED ORDER — LEVEMIR FLEXTOUCH 100 UNIT/ML ~~LOC~~ SOPN: 60.0000 [IU] | PEN_INJECTOR | Freq: Every day | SUBCUTANEOUS | 2 refills | Status: DC

## 2018-12-06 MED ORDER — INSULIN LISPRO (1 UNIT DIAL) 100 UNIT/ML (KWIKPEN): 10.0000 [IU] | PEN_INJECTOR | Freq: Three times a day (TID) | SUBCUTANEOUS | 2 refills | Status: DC

## 2018-12-06 NOTE — Progress Notes (Signed)
12/06/2018, 5:09 PM                                                    Endocrinology Telehealth Visit Follow up Note -During COVID -19 Pandemic  This visit type was conducted due to national recommendations for restrictions regarding the COVID-19 Pandemic  in an effort to limit this patient's exposure and mitigate transmission of the corona virus.  Due to his co-morbid illnesses, Alexander [name] is at  moderate to high risk for complications without adequate follow up.  This format is felt to be most appropriate for him at this time.  I connected with this patient on 12/06/2018   by telephone and verified that I am speaking with the correct person using two identifiers. Alexander [name], 19-Mar-1966. he has verbally consented to this visit. All issues noted in this document were discussed and addressed. The format was not optimal for physical exam.    Subjective:    Patient ID: Alexander [name], male    DOB: 19-Mar-1966.  Alexander [name] is being engaged in telehealth via telephone in follow-up  for management of currently uncontrolled uncontrolled type 2 diabetes, hyperlipidemia, hypertension. PCP: Alita ChyleVarner, Gina L, FNP.   Past Medical History:  Diagnosis Date  . Diabetes mellitus without complication (HCC)   . Diabetes mellitus, type II (HCC)   . Hyperlipidemia   . Hypertension   . Kidney stones   . Pneumococcal pneumonia (HCC) 2014  . Sleep apnea    Past Surgical History:  Procedure Laterality Date  . KIDNEY SURGERY    . LITHOTRIPSY    . REMOVAL OF GASTROSTOMY TUBE    . TRACHEOSTOMY CLOSURE     Social History   Socioeconomic History  . Marital status: Married    Spouse name: Not on file  . Number of children: Not on file  . Years of education: Not on file  . Highest education level: Not on file  Occupational History  . Not on file  Social Needs  . Financial resource strain: Not on file  . Food insecurity    Worry: Not on file    Inability: Not on  file  . Transportation needs    Medical: Not on file    Non-medical: Not on file  Tobacco Use  . Smoking status: Current Every Day Smoker    Packs/day: 1.00    Years: 40.00    Pack years: 40.00    Types: Cigars, Cigarettes  . Smokeless tobacco: Never Used  Substance and Sexual Activity  . Alcohol use: No  . Drug use: Yes    Types: Marijuana  . Sexual activity: Not on file  Lifestyle  . Physical activity    Days per week: Not on file    Minutes per session: Not on file  . Stress: Not on file  Relationships  . Social Musicianconnections    Talks on phone: Not on file    Gets together: Not on file    Attends religious service: Not on file    Active member of club or organization: Not on file    Attends meetings of clubs or organizations: Not on file    Relationship status: Not on file  Other Topics Concern  . Not on file  Social History Narrative  . Not  on file   Outpatient Encounter Medications as of 12/06/2018  Medication Sig  . albuterol (PROVENTIL HFA;VENTOLIN HFA) 108 (90 Base) MCG/ACT inhaler Inhale into the lungs every 4 (four) hours as needed for wheezing or shortness of breath.  Marland Kitchen. amLODipine (NORVASC) 10 MG tablet Take 10 mg by mouth daily.  Marland Kitchen. atenolol (TENORMIN) 25 MG tablet daily.  Marland Kitchen. atorvastatin (LIPITOR) 40 MG tablet Take 40 mg by mouth daily.  . DULoxetine HCl 60 MG CSDR daily.  . fluticasone furoate-vilanterol (BREO ELLIPTA) 200-25 MCG/INH AEPB Inhale 1 puff into the lungs daily.  . furosemide (LASIX) 20 MG tablet daily.  Marland Kitchen. glucose blood test strip 1 each by Other route 4 (four) times daily. Use as instructed qid. EZ Max test strips. E11.65  . hydrochlorothiazide (HYDRODIURIL) 25 MG tablet daily.  . Insulin Detemir (LEVEMIR FLEXTOUCH) 100 UNIT/ML Pen Inject 60 Units into the skin at bedtime.  . insulin lispro (HUMALOG KWIKPEN) 100 UNIT/ML KwikPen Inject 0.1 mLs (10 Units total) into the skin 3 (three) times daily before meals.  . lamoTRIgine (LAMICTAL) 100 MG tablet  200 mg 2 (two) times daily.   . Lancets MISC 1 each by Does not apply route 4 (four) times daily. Use as instructed qid. Pt has an EZ Max glucometer  . lisinopril (PRINIVIL,ZESTRIL) 20 MG tablet Take 20 mg by mouth daily.  . predniSONE (DELTASONE) 10 MG tablet Take 10 mg by mouth 2 (two) times daily with a meal.  . traZODone (DESYREL) 150 MG tablet Take by mouth at bedtime.  . [DISCONTINUED] Insulin Detemir (LEVEMIR FLEXTOUCH) 100 UNIT/ML Pen Inject 80 Units into the skin at bedtime.  . [DISCONTINUED] insulin lispro (HUMALOG KWIKPEN) 100 UNIT/ML KwikPen Inject 0.15-0.21 mLs (15-21 Units total) into the skin 3 (three) times daily before meals.   No facility-administered encounter medications on file as of 12/06/2018.     ALLERGIES: Allergies  Allergen Reactions  . Invokana [Canagliflozin] Other (See Comments)    Yeast infection  . Metformin And Related   . Pioglitazone     VACCINATION STATUS:  There is no immunization history on file for this patient.  Diabetes He presents for his follow-up diabetic visit. He has type 2 diabetes mellitus. Onset time: He was diagnosed at approximate age of 29 years. His disease course has been improving. There are no hypoglycemic associated symptoms. Pertinent negatives for hypoglycemia include no confusion, headaches, pallor or seizures. Pertinent negatives for diabetes include no chest pain, no fatigue, no polydipsia, no polyphagia, no polyuria and no weakness. There are no hypoglycemic complications. Symptoms are improving. Diabetic complications include nephropathy and peripheral neuropathy. Risk factors for coronary artery disease include dyslipidemia, diabetes mellitus, family history, male sex, obesity, hypertension, sedentary lifestyle and tobacco exposure. Current diabetic treatment includes insulin injections (He is currently on Levemir 60 units twice daily, Humalog 5 units 3 times daily AC, glimepiride.). He is compliant with treatment some of the  time. He is following a generally unhealthy diet. When asked about meal planning, he reported none. He has not had a previous visit with a dietitian. He never participates in exercise. His home blood glucose trend is decreasing steadily. His breakfast blood glucose range is generally 90-110 mg/dl. His lunch blood glucose range is generally 130-140 mg/dl. His dinner blood glucose range is generally 130-140 mg/dl. His bedtime blood glucose range is generally 130-140 mg/dl. His overall blood glucose range is 130-140 mg/dl. An ACE inhibitor/angiotensin II receptor blocker is being taken. Eye exam is current.  Hyperlipidemia This is  a chronic problem. The current episode started more than 1 year ago. The problem is uncontrolled. Exacerbating diseases include chronic renal disease, diabetes and obesity. Pertinent negatives include no chest pain, myalgias or shortness of breath. Risk factors for coronary artery disease include family history, dyslipidemia, diabetes mellitus, hypertension, male sex, obesity and a sedentary lifestyle.  Hypertension This is a chronic problem. The current episode started more than 1 year ago. The problem is uncontrolled. Pertinent negatives include no chest pain, headaches, neck pain, palpitations or shortness of breath. Risk factors for coronary artery disease include dyslipidemia, diabetes mellitus, male gender, obesity, sedentary lifestyle, smoking/tobacco exposure and family history. Past treatments include ACE inhibitors and calcium channel blockers. Identifiable causes of hypertension include chronic renal disease.     Review of systems: Limited as above.  Objective:    There were no vitals taken for this visit.  Wt Readings from Last 3 Encounters:  08/15/18 (!) 308 lb (139.7 kg)  05/01/18 (!) 312 lb (141.5 kg)  01/26/18 (!) 319 lb (144.7 kg)     CMP     Component Value Date/Time   NA 138 06/22/2014 2251   K 3.4 12/04/2018   CL 101 06/22/2014 2251   CO2 26  06/22/2014 2251   GLUCOSE 252 (H) 06/22/2014 2251   BUN 24 (A) 12/04/2018   CREATININE 1.2 12/04/2018   CREATININE 0.79 06/22/2014 2251   CALCIUM 9.0 06/22/2014 2251   AST 18 12/04/2018   ALT 19 12/04/2018   ALKPHOS 161 (A) 12/04/2018   GFRNONAA >90 06/22/2014 2251   GFRAA >90 06/22/2014 2251    Diabetic Labs (most recent): Lab Results  Component Value Date   HGBA1C 6.2 12/04/2018   HGBA1C 10.9 04/11/2018   HGBA1C 8.5 01/02/2018     Lipid Panel ( most recent) Lipid Panel     Component Value Date/Time   CHOL 132 01/02/2018   TRIG 165 (A) 01/02/2018   HDL 35 01/02/2018   LDLCALC 66 01/02/2018      Lab Results  Component Value Date   TSH 1.30 03/17/2017      Assessment & Plan:   1. Type 2 diabetes mellitus with stage 2 chronic kidney disease, with long-term current use of insulin (HCC)  - DEMARKIS SALMINEN has currently uncontrolled symptomatic type 2 DM since 53 years of age. -He reports tight fasting glycemic profile and near target postprandial glycemic profile.  He is A1c 6.2%, dramatically improved from 10.9% during his last visit.     -his diabetes is complicated by renal insufficiency, peripheral neuropathy, obesity/sedentary life , chronic heavy smoking, and JOSAFAT FICKERT remains at extremely high risk for more acute and chronic complications which include CAD, CVA, CKD, retinopathy, and neuropathy. These are all discussed in detail with the patient.  - I have counseled him on diet management and weight loss, by adopting a carbohydrate restricted/protein rich diet. -He admits to dietary indiscretions including consumption of sweets and sweetened beverages.  - he  admits there is a room for improvement in his diet and drink choices. -  Suggestion is made for him to avoid simple carbohydrates  from his diet including Cakes, Sweet Desserts / Pastries, Ice Cream, Soda (diet and regular), Sweet Tea, Candies, Chips, Cookies, Sweet Pastries,  Store Bought Juices,  Alcohol in Excess of  1-2 drinks a day, Artificial Sweeteners, Coffee Creamer, and "Sugar-free" Products. This will help patient to have stable blood glucose profile and potentially avoid unintended weight gain.   - I encouraged  him to switch to  unprocessed or minimally processed complex starch and increased protein intake (animal or plant source), fruits, and vegetables.  - he is advised to stick to a routine mealtimes to eat 3 meals  a day and avoid unnecessary snacks ( to snack only to correct hypoglycemia).    - I have approached him with the following individualized plan to manage diabetes and patient reluctantly accepts.   -Given his tightening glycemic profile, she will be considered for lower dose insulin.   -He is advised to lower his Levemir to 60 units nightly, lower his Humalog to 10 units 3 times a day with meals  for pre-meal BG readings of 90-150mg /dl, plus patient specific correction dose for unexpected hyperglycemia above 150mg /dl, associated with strict monitoring of glucose 4 times a day-before meals and at bedtime. - Patient is warned not to take insulin without proper monitoring per orders. -Adjustment parameters are given for hypo and hyperglycemia in writing. -Patient is encouraged to call clinic for blood glucose levels less than 70 or above 300 mg /dl.   -Patient does not tolerate metformin. -Patient is not a suitable candidate for SGLT2 inhibitors due to CKD. -Reportedly, he will was given samples of Invokana from his nephrologist office, he is advised to discontinue.  - Patient specific target  A1c;  LDL, HDL, Triglycerides, and  Waist Circumference were discussed in detail.  2) BP/HTN: he is advised to home monitor blood pressure and report if > 140/90 on 2 separate readings.   He is advised to continue current medications including lisinopril 20 mg p.o. daily along with amlodipine .  3) Lipids/HPL: His recent lipid panel showed controlled LDL at 66.  He is  advised to continue atorvastatin 40 mg p.o. nightly.     4)  Weight/Diet: He missed his diabetes education appointment.  exercise, and detailed carbohydrates information provided.  5) Chronic Care/Health Maintenance:  -he  is on ACEI/ARB and Statin medications and  is encouraged to continue to follow up with Ophthalmology, Dentist,  Podiatrist at least yearly or according to recommendations, and advised to  Quit . He has 50+-pack-year history of smoking.  I have recommended yearly flu vaccine and pneumonia vaccination at least every 5 years; moderate intensity exercise for up to 150 minutes weekly; and  sleep for at least 7 hours a day.  - I advised patient to maintain close follow up with Clovis Riley, FNP for primary care needs.  - Patient Care Time Today:  25 min, of which >50% was spent in  counseling and the rest reviewing his  current and  previous labs/studies, previous treatments, his blood glucose readings, and medications' doses and developing a plan for long-term care based on the latest recommendations for standards of care.   Stanton Kidney participated in the discussions, expressed understanding, and voiced agreement with the above plans.  All questions were answered to his satisfaction. he is encouraged to contact clinic should he have any questions or concerns prior to his return visit.   Follow up plan: -He will return in 5 weeks with repeat labs, meter and logs. Glade Lloyd, MD [facility] [address] [place], [place] [address] Phone: [phone]  Fax: [phone]    12/06/2018, 5:09 PM  This note was partially dictated with voice recognition software. Similar sounding words can be transcribed inadequately or may not  be corrected upon review.

## 2018-12-20 ENCOUNTER — Ambulatory Visit (INDEPENDENT_AMBULATORY_CARE_PROVIDER_SITE_OTHER): Payer: 59 | Admitting: "Endocrinology

## 2018-12-20 ENCOUNTER — Encounter: Payer: Self-pay | Admitting: "Endocrinology

## 2018-12-20 ENCOUNTER — Other Ambulatory Visit: Payer: Self-pay

## 2018-12-20 DIAGNOSIS — E782 Mixed hyperlipidemia: Secondary | ICD-10-CM | POA: Diagnosis not present

## 2018-12-20 DIAGNOSIS — E1122 Type 2 diabetes mellitus with diabetic chronic kidney disease: Secondary | ICD-10-CM

## 2018-12-20 DIAGNOSIS — I10 Essential (primary) hypertension: Secondary | ICD-10-CM | POA: Diagnosis not present

## 2018-12-20 DIAGNOSIS — N182 Chronic kidney disease, stage 2 (mild): Secondary | ICD-10-CM

## 2018-12-20 DIAGNOSIS — Z794 Long term (current) use of insulin: Secondary | ICD-10-CM

## 2018-12-20 NOTE — Progress Notes (Signed)
12/20/2018, 4:31 PM                                                    Endocrinology Telehealth Visit Follow up Note -During COVID -19 Pandemic  This visit type was conducted due to national recommendations for restrictions regarding the COVID-19 Pandemic  in an effort to limit this patient's exposure and mitigate transmission of the corona virus.  Due to his co-morbid illnesses, Alexander [name] is at  moderate to high risk for complications without adequate follow up.  This format is felt to be most appropriate for him at this time.  I connected with this patient on 12/20/2018   by telephone and verified that I am speaking with the correct person using two identifiers. Alexander [name], [birth year]. he has verbally consented to this visit. All issues noted in this document were discussed and addressed. The format was not optimal for physical exam.    Subjective:    Patient ID: Alexander [name], male    DOB: 08-20-1965.  Alexander [name] is being engaged in telehealth via telephone in follow-up  for management of currently uncontrolled uncontrolled type 2 diabetes, hyperlipidemia, hypertension. PCP: Alita Chyle, FNP.   Past Medical History:  Diagnosis Date  . Diabetes mellitus without complication (HCC)   . Diabetes mellitus, type II (HCC)   . Hyperlipidemia   . Hypertension   . [name] stones   . Pneumococcal pneumonia (HCC) 2014  . Sleep apnea    Past Surgical History:  Procedure Laterality Date  . [name] SURGERY    . LITHOTRIPSY    . REMOVAL OF GASTROSTOMY TUBE    . TRACHEOSTOMY CLOSURE     Social History   Socioeconomic History  . Marital status: Married    Spouse name: Not on file  . Number of children: Not on file  . Years of education: Not on file  . Highest education level: Not on file  Occupational History  . Not on file  Social Needs  . Financial resource strain: Not on file  . Food insecurity    Worry: Not on file    Inability: Not on  file  . Transportation needs    Medical: Not on file    Non-medical: Not on file  Tobacco Use  . Smoking status: Current Every Day Smoker    Packs/day: 1.00    Years: 40.00    Pack years: 40.00    Types: Cigars, Cigarettes  . Smokeless tobacco: Never Used  Substance and Sexual Activity  . Alcohol use: No  . Drug use: Yes    Types: Marijuana  . Sexual activity: Not on file  Lifestyle  . Physical activity    Days per week: Not on file    Minutes per session: Not on file  . Stress: Not on file  Relationships  . Social Musician on phone: Not on file    Gets together: Not on file    Attends religious service: Not on file    Active member of club or organization: Not on file    Attends meetings of clubs or organizations: Not on file    Relationship status: Not on file  Other Topics Concern  . Not on file  Social History Narrative  . Not  on file   Outpatient Encounter Medications as of 12/20/2018  Medication Sig  . albuterol (PROVENTIL HFA;VENTOLIN HFA) 108 (90 Base) MCG/ACT inhaler Inhale into the lungs every 4 (four) hours as needed for wheezing or shortness of breath.  Marland Kitchen amLODipine (NORVASC) 10 MG tablet Take 10 mg by mouth daily.  Marland Kitchen atenolol (TENORMIN) 25 MG tablet daily.  Marland Kitchen atorvastatin (LIPITOR) 40 MG tablet Take 40 mg by mouth daily.  . DULoxetine HCl 60 MG CSDR daily.  . fluticasone furoate-vilanterol (BREO ELLIPTA) 200-25 MCG/INH AEPB Inhale 1 puff into the lungs daily.  . furosemide (LASIX) 20 MG tablet daily.  Marland Kitchen glucose blood test strip 1 each by Other route 4 (four) times daily. Use as instructed qid. EZ Max test strips. E11.65  . hydrochlorothiazide (HYDRODIURIL) 25 MG tablet daily.  . Insulin Detemir (LEVEMIR FLEXTOUCH) 100 UNIT/ML Pen Inject 60 Units into the skin at bedtime.  . insulin lispro (HUMALOG KWIKPEN) 100 UNIT/ML KwikPen Inject 0.1 mLs (10 Units total) into the skin 3 (three) times daily before meals.  . lamoTRIgine (LAMICTAL) 100 MG tablet  200 mg 2 (two) times daily.   . Lancets MISC 1 each by Does not apply route 4 (four) times daily. Use as instructed qid. Pt has an EZ Max glucometer  . lisinopril (PRINIVIL,ZESTRIL) 20 MG tablet Take 20 mg by mouth daily.  . predniSONE (DELTASONE) 10 MG tablet Take 10 mg by mouth 2 (two) times daily with a meal.  . traZODone (DESYREL) 150 MG tablet Take by mouth at bedtime.   No facility-administered encounter medications on file as of 12/20/2018.     ALLERGIES: Allergies  Allergen Reactions  . Invokana [Canagliflozin] Other (See Comments)    Yeast infection  . Metformin And Related   . Pioglitazone     VACCINATION STATUS:  There is no immunization history on file for this patient.  Diabetes He presents for his follow-up diabetic visit. He has type 2 diabetes mellitus. Onset time: He was diagnosed at approximate age of 29 years. His disease course has been improving. There are no hypoglycemic associated symptoms. Pertinent negatives for hypoglycemia include no confusion, headaches, pallor or seizures. Pertinent negatives for diabetes include no chest pain, no fatigue, no polydipsia, no polyphagia, no polyuria and no weakness. There are no hypoglycemic complications. Symptoms are improving. Diabetic complications include nephropathy and peripheral neuropathy. Risk factors for coronary artery disease include dyslipidemia, diabetes mellitus, family history, male sex, obesity, hypertension, sedentary lifestyle and tobacco exposure. Current diabetic treatment includes insulin injections (He is currently on Levemir 60 units twice daily, Humalog 5 units 3 times daily AC, glimepiride.). He is compliant with treatment some of the time. He is following a generally unhealthy diet. When asked about meal planning, he reported none. He has not had a previous visit with a dietitian. He never participates in exercise. His home blood glucose trend is fluctuating minimally. His breakfast blood glucose range is  generally 140-180 mg/dl. His lunch blood glucose range is generally 140-180 mg/dl. His dinner blood glucose range is generally 140-180 mg/dl. His bedtime blood glucose range is generally 140-180 mg/dl. His overall blood glucose range is 140-180 mg/dl. An ACE inhibitor/angiotensin II receptor blocker is being taken. Eye exam is current.  Hyperlipidemia This is a chronic problem. The current episode started more than 1 year ago. The problem is uncontrolled. Exacerbating diseases include chronic renal disease, diabetes and obesity. Pertinent negatives include no chest pain, myalgias or shortness of breath. Risk factors for coronary artery disease include  family history, dyslipidemia, diabetes mellitus, hypertension, male sex, obesity and a sedentary lifestyle.  Hypertension This is a chronic problem. The current episode started more than 1 year ago. The problem is uncontrolled. Pertinent negatives include no chest pain, headaches, neck pain, palpitations or shortness of breath. Risk factors for coronary artery disease include dyslipidemia, diabetes mellitus, male gender, obesity, sedentary lifestyle, smoking/tobacco exposure and family history. Past treatments include ACE inhibitors and calcium channel blockers. Identifiable causes of hypertension include chronic renal disease.     Review of systems: Limited as above.  Objective:    There were no vitals taken for this visit.  Wt Readings from Last 3 Encounters:  08/15/18 (!) 308 lb (139.7 kg)  05/01/18 (!) 312 lb (141.5 kg)  01/26/18 (!) 319 lb (144.7 kg)     CMP     Component Value Date/Time   NA 138 06/22/2014 2251   K 3.4 12/04/2018   CL 101 06/22/2014 2251   CO2 26 06/22/2014 2251   GLUCOSE 252 (H) 06/22/2014 2251   BUN 24 (A) 12/04/2018   CREATININE 1.2 12/04/2018   CREATININE 0.79 06/22/2014 2251   CALCIUM 9.0 06/22/2014 2251   AST 18 12/04/2018   ALT 19 12/04/2018   ALKPHOS 161 (A) 12/04/2018   GFRNONAA >90 06/22/2014 2251    GFRAA >90 06/22/2014 2251    Diabetic Labs (most recent): Lab Results  Component Value Date   HGBA1C 6.2 12/04/2018   HGBA1C 10.9 04/11/2018   HGBA1C 8.5 01/02/2018     Lipid Panel ( most recent) Lipid Panel     Component Value Date/Time   CHOL 132 01/02/2018   TRIG 165 (A) 01/02/2018   HDL 35 01/02/2018   LDLCALC 66 01/02/2018      Lab Results  Component Value Date   TSH 1.30 03/17/2017      Assessment & Plan:   1. 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 -He reports center glycemic profile fasting between 83-1 9 2, prelunch between 95-172, presupper between 93 and 240.  His most recent labs show A1c of 6.2% improving from 10.9%  -He denies hypoglycemia since last visit. -his diabetes is complicated by renal insufficiency, peripheral neuropathy, obesity/sedentary life , chronic heavy smoking, and Alexander [name] remains at extremely high risk for more acute and chronic complications which include CAD, CVA, CKD, retinopathy, and neuropathy. These are all discussed in detail with the patient.  - I have counseled him on diet management and weight loss, by adopting a carbohydrate restricted/protein rich diet. -He admits to dietary indiscretions including consumption of sweets and sweetened beverages.  - he  admits there is a room for improvement in his diet and drink choices. -  Suggestion is made for him to avoid simple carbohydrates  from his diet including Cakes, Sweet Desserts / Pastries, Ice Cream, Soda (diet and regular), Sweet Tea, Candies, Chips, Cookies, Sweet Pastries,  Store Bought Juices, Alcohol in Excess of  1-2 drinks a day, Artificial Sweeteners, Coffee Creamer, and "Sugar-free" Products. This will help patient to have stable blood glucose profile and potentially avoid unintended weight gain.   - I  encouraged him to switch to  unprocessed or minimally processed complex starch and increased protein intake (animal or plant source), fruits, and vegetables.  - he is advised to stick to a routine mealtimes to eat  3 meals  a day and avoid unnecessary snacks ( to snack only to correct hypoglycemia).    - I have approached him with the following individualized plan to manage diabetes and patient reluctantly accepts.     -He is advised to continue Levemir 60 units nightly, continue Humalog 10 units 3 times a day with meals  for pre-meal BG readings of 90-150mg /dl, plus patient specific correction dose for unexpected hyperglycemia above 150mg /dl, associated with strict monitoring of glucose 4 times a day-before meals and at bedtime. - Patient is warned not to take insulin without proper monitoring per orders. -Adjustment parameters are given for hypo and hyperglycemia in writing. -Patient is encouraged to call clinic for blood glucose levels less than 70 or above 300 mg /dl.   -Patient does not tolerate metformin. -Patient is not a suitable candidate for SGLT2 inhibitors due to CKD. -Reportedly, he will was given samples of Invokana from his nephrologist office, he is advised to discontinue.  - Patient specific target  A1c;  LDL, HDL, Triglycerides, and  Waist Circumference were discussed in detail.  2) BP/HTN: he is advised to home monitor blood pressure and report if > 140/90 on 2 separate readings.   He is advised to continue current medications including lisinopril 20 mg p.o. daily along with amlodipine .  3) Lipids/HPL: His recent lipid panel showed controlled LDL at 66.  He is advised to continue atorvastatin 40 mg p.o. nightly.      4)  Weight/Diet: He missed his diabetes education appointment.  exercise, and detailed carbohydrates information provided.  5) Chronic Care/Health Maintenance:  -he  is on ACEI/ARB and Statin medications and  is encouraged to continue to follow up with  Ophthalmology, Dentist,  Podiatrist at least yearly or according to recommendations, and advised to  Quit . He has 50+-pack-year history of smoking.  I have recommended yearly flu vaccine and pneumonia vaccination at least every 5 years; moderate intensity exercise for up to 150 minutes weekly; and  sleep for at least 7 hours a day.  - I advised patient to maintain close follow up with Alexander Riley, FNP for primary care needs.  - Patient Care Time Today:  25 min, of which >50% was spent in  counseling and the rest reviewing his  current and  previous labs/studies, previous treatments, his blood glucose readings, and medications' doses and developing a plan for long-term care based on the latest recommendations for standards of care.   Alexander [name] participated in the discussions, expressed understanding, and voiced agreement with the above plans.  All questions were answered to his satisfaction. he is encouraged to contact clinic should he have any questions or concerns prior to his return visit.   Follow up plan: -He will return in 5 weeks with repeat labs, meter and logs. Glade Lloyd, MD [employer] [facility] [address] [place], [place] [address] Phone: [phone]  Fax: [phone]    12/20/2018, 4:31 PM  This note was partially dictated with voice recognition software. Similar sounding words can be transcribed inadequately or may not  be corrected upon review.

## 2019-01-03 ENCOUNTER — Other Ambulatory Visit: Payer: Self-pay

## 2019-01-03 MED ORDER — LEVEMIR FLEXTOUCH 100 UNIT/ML ~~LOC~~ SOPN: 60.0000 [IU] | PEN_INJECTOR | Freq: Every day | SUBCUTANEOUS | 2 refills | Status: DC

## 2019-01-03 MED ORDER — INSULIN LISPRO (1 UNIT DIAL) 100 UNIT/ML (KWIKPEN): 10.0000 [IU] | PEN_INJECTOR | Freq: Three times a day (TID) | SUBCUTANEOUS | 2 refills | Status: DC

## 2019-01-09 ENCOUNTER — Other Ambulatory Visit: Payer: Self-pay

## 2019-01-09 MED ORDER — INSULIN LISPRO (1 UNIT DIAL) 100 UNIT/ML (KWIKPEN): 10.0000 [IU] | PEN_INJECTOR | Freq: Three times a day (TID) | SUBCUTANEOUS | 2 refills | Status: DC

## 2019-01-11 ENCOUNTER — Other Ambulatory Visit: Payer: Self-pay

## 2019-01-11 MED ORDER — INSULIN LISPRO (1 UNIT DIAL) 100 UNIT/ML (KWIKPEN): 10.0000 [IU] | PEN_INJECTOR | Freq: Three times a day (TID) | SUBCUTANEOUS | 2 refills | Status: DC

## 2019-02-12 ENCOUNTER — Telehealth: Payer: Self-pay | Admitting: "Endocrinology

## 2019-02-12 NOTE — Telephone Encounter (Signed)
He needs to discuss this with his PMD.

## 2019-02-12 NOTE — Telephone Encounter (Signed)
Pt said that his nephrologist took him off of his meloxicam and would like to know if you would write him the RX for his arthritis.

## 2019-03-26 ENCOUNTER — Other Ambulatory Visit: Payer: Self-pay

## 2019-03-26 MED ORDER — GLUCOSE BLOOD VI STRP: 1.0000 | ORAL_STRIP | Freq: Four times a day (QID) | 5 refills | Status: DC

## 2019-03-26 MED ORDER — LANCETS MISC: 1.0000 | Freq: Four times a day (QID) | 5 refills | Status: DC

## 2019-04-23 ENCOUNTER — Ambulatory Visit: Payer: 59 | Admitting: "Endocrinology

## 2019-05-08 ENCOUNTER — Ambulatory Visit: Payer: 59 | Admitting: "Endocrinology

## 2019-05-14 ENCOUNTER — Ambulatory Visit: Payer: Medicare Other | Admitting: "Endocrinology

## 2019-05-17 LAB — HEMOGLOBIN A1C: Hemoglobin A1C: 6.2

## 2019-05-23 ENCOUNTER — Encounter: Payer: Self-pay | Admitting: "Endocrinology

## 2019-05-23 ENCOUNTER — Other Ambulatory Visit: Payer: Self-pay

## 2019-05-23 ENCOUNTER — Ambulatory Visit (INDEPENDENT_AMBULATORY_CARE_PROVIDER_SITE_OTHER): Payer: Medicare Other | Admitting: "Endocrinology

## 2019-05-23 VITALS — BP 171/90 | HR 67 | Ht 66.0 in | Wt 327.0 lb

## 2019-05-23 DIAGNOSIS — Z794 Long term (current) use of insulin: Secondary | ICD-10-CM

## 2019-05-23 DIAGNOSIS — E1122 Type 2 diabetes mellitus with diabetic chronic kidney disease: Secondary | ICD-10-CM | POA: Diagnosis not present

## 2019-05-23 DIAGNOSIS — N182 Chronic kidney disease, stage 2 (mild): Secondary | ICD-10-CM | POA: Diagnosis not present

## 2019-05-23 DIAGNOSIS — E782 Mixed hyperlipidemia: Secondary | ICD-10-CM | POA: Diagnosis not present

## 2019-05-23 DIAGNOSIS — I10 Essential (primary) hypertension: Secondary | ICD-10-CM | POA: Diagnosis not present

## 2019-05-23 MED ORDER — LEVEMIR FLEXTOUCH 100 UNIT/ML ~~LOC~~ SOPN: 64.0000 [IU] | PEN_INJECTOR | Freq: Every day | SUBCUTANEOUS | 2 refills | Status: DC

## 2019-05-23 MED ORDER — INSULIN LISPRO (1 UNIT DIAL) 100 UNIT/ML (KWIKPEN): 5.0000 [IU] | PEN_INJECTOR | Freq: Three times a day (TID) | SUBCUTANEOUS | 2 refills | Status: DC

## 2019-05-23 MED ORDER — GLIPIZIDE ER 5 MG PO TB24: 5.0000 mg | ORAL_TABLET | Freq: Every day | ORAL | 3 refills | Status: DC

## 2019-05-23 NOTE — Progress Notes (Signed)
05/23/2019, 12:57 PM   Endocrinology follow-up note    Subjective:    Patient ID: Alexander [name], male    DOB: November 22, 1965.  Alexander [name] is being seen in follow-up  for management of currently uncontrolled uncontrolled type 2 diabetes, hyperlipidemia, hypertension. PCP: Alita Chyle, FNP.   Past Medical History:  Diagnosis Date  . Diabetes mellitus without complication (HCC)   . Diabetes mellitus, type II (HCC)   . Hyperlipidemia   . Hypertension   . Kidney stones   . Pneumococcal pneumonia (HCC) 2014  . Sleep apnea    Past Surgical History:  Procedure Laterality Date  . KIDNEY SURGERY    . LITHOTRIPSY    . REMOVAL OF GASTROSTOMY TUBE    . TRACHEOSTOMY CLOSURE     Social History   Socioeconomic History  . Marital status: Married    Spouse name: Not on file  . Number of children: Not on file  . Years of education: Not on file  . Highest education level: Not on file  Occupational History  . Not on file  Tobacco Use  . Smoking status: Current Every Day Smoker    Packs/day: 1.00    Years: 40.00    Pack years: 40.00    Types: Cigars, Cigarettes  . Smokeless tobacco: Never Used  Substance and Sexual Activity  . Alcohol use: No  . Drug use: Yes    Types: Marijuana  . Sexual activity: Not on file  Other Topics Concern  . Not on file  Social History Narrative  . Not on file   Social Determinants of Health   Financial Resource Strain:   . Difficulty of Paying Living Expenses: Not on file  Food Insecurity:   . Worried About Programme researcher, broadcasting/film/video in the Last Year: Not on file  . Ran Out of Food in the Last Year: Not on file  Transportation Needs:   . Lack of Transportation (Medical): Not on file  . Lack of Transportation (Non-Medical): Not on file  Physical Activity:   . Days of Exercise per Week: Not on file  . Minutes of Exercise per Session: Not on file  Stress:   . Feeling of Stress : Not on file  Social Connections:   .  Frequency of Communication with Friends and Family: Not on file  . Frequency of Social Gatherings with Friends and Family: Not on file  . Attends Religious Services: Not on file  . Active Member of Clubs or Organizations: Not on file  . Attends Banker Meetings: Not on file  . Marital Status: Not on file   Outpatient Encounter Medications as of 05/23/2019  Medication Sig  . albuterol (PROVENTIL HFA;VENTOLIN HFA) 108 (90 Base) MCG/ACT inhaler Inhale into the lungs every 4 (four) hours as needed for wheezing or shortness of breath.  Marland Kitchen amLODipine (NORVASC) 10 MG tablet Take 10 mg by mouth daily.  Marland Kitchen atenolol (TENORMIN) 25 MG tablet daily.  Marland Kitchen atorvastatin (LIPITOR) 40 MG tablet Take 40 mg by mouth daily.  . busPIRone (BUSPAR) 10 MG tablet buspirone 10 mg tablet  TAKE ONE TABLET BY MOUTH TWICE DAILY FOR ANXIETY  . DULoxetine (CYMBALTA) 30 MG capsule Take 30 mg by mouth daily.  . DULoxetine HCl 60 MG CSDR daily.  . fluticasone furoate-vilanterol (BREO ELLIPTA) 200-25 MCG/INH AEPB Inhale 1 puff into the lungs daily.  . furosemide (LASIX) 20 MG tablet 40 mg daily. Take 40mg  daily then  take 1 extra pill 2 times a week  . glipiZIDE (GLUCOTROL XL) 5 MG 24 hr tablet Take 1 tablet (5 mg total) by mouth daily with breakfast.  . glucose blood test strip 1 each by Other route 4 (four) times daily. Use as instructed qid. EZ Max test strips. E11.65  . hydrochlorothiazide (HYDRODIURIL) 25 MG tablet daily.  . Insulin Detemir (LEVEMIR FLEXTOUCH) 100 UNIT/ML Pen Inject 64 Units into the skin at bedtime.  . insulin lispro (HUMALOG KWIKPEN) 100 UNIT/ML KwikPen Inject 0.05-0.11 mLs (5-11 Units total) into the skin 3 (three) times daily before meals.  . lamoTRIgine (LAMICTAL) 100 MG tablet 200 mg 2 (two) times daily.   . Lancets MISC 1 each by Does not apply route 4 (four) times daily. Use as instructed qid. Pt has an EZ Max glucometer  . lisinopril (PRINIVIL,ZESTRIL) 20 MG tablet Take 40 mg by mouth  daily.   . predniSONE (DELTASONE) 10 MG tablet Take 10 mg by mouth 2 (two) times daily with a meal.  . traZODone (DESYREL) 150 MG tablet Take by mouth at bedtime.  . [DISCONTINUED] Insulin Detemir (LEVEMIR FLEXTOUCH) 100 UNIT/ML Pen Inject 60 Units into the skin at bedtime.  . [DISCONTINUED] insulin lispro (HUMALOG KWIKPEN) 100 UNIT/ML KwikPen Inject 0.1-0.16 mLs (10-16 Units total) into the skin 3 (three) times daily before meals.   No facility-administered encounter medications on file as of 05/23/2019.    ALLERGIES: Allergies  Allergen Reactions  . Latex   . Metformin And Related   . Pioglitazone     VACCINATION STATUS:  There is no immunization history on file for this patient.  Diabetes He presents for his follow-up diabetic visit. He has type 2 diabetes mellitus. Onset time: He was diagnosed at approximate age of 29 years. His disease course has been stable. There are no hypoglycemic associated symptoms. Pertinent negatives for hypoglycemia include no confusion, headaches, pallor or seizures. Pertinent negatives for diabetes include no chest pain, no fatigue, no polydipsia, no polyphagia, no polyuria and no weakness. There are no hypoglycemic complications. Symptoms are stable. Diabetic complications include nephropathy and peripheral neuropathy. Risk factors for coronary artery disease include dyslipidemia, diabetes mellitus, family history, male sex, obesity, hypertension, sedentary lifestyle and tobacco exposure. Current diabetic treatment includes insulin injections (He is currently on Levemir 60 units twice daily, Humalog 5 units 3 times daily AC, glimepiride.). He is compliant with treatment some of the time. His weight is increasing steadily. He is following a generally unhealthy diet. When asked about meal planning, he reported none. He has not had a previous visit with a dietitian. He never participates in exercise. His home blood glucose trend is decreasing steadily. His breakfast  blood glucose range is generally 140-180 mg/dl. His lunch blood glucose range is generally 140-180 mg/dl. His dinner blood glucose range is generally 140-180 mg/dl. His bedtime blood glucose range is generally 140-180 mg/dl. His overall blood glucose range is 140-180 mg/dl. An ACE inhibitor/angiotensin II receptor blocker is being taken. Eye exam is current.  Hyperlipidemia This is a chronic problem. The current episode started more than 1 year ago. The problem is uncontrolled. Exacerbating diseases include chronic renal disease, diabetes and obesity. Pertinent negatives include no chest pain, myalgias or shortness of breath. Risk factors for coronary artery disease include family history, dyslipidemia, diabetes mellitus, hypertension, male sex, obesity and a sedentary lifestyle.  Hypertension This is a chronic problem. The current episode started more than 1 year ago. The problem is uncontrolled. Pertinent negatives include no  chest pain, headaches, neck pain, palpitations or shortness of breath. Risk factors for coronary artery disease include dyslipidemia, diabetes mellitus, male gender, obesity, sedentary lifestyle, smoking/tobacco exposure and family history. Past treatments include ACE inhibitors and calcium channel blockers. Identifiable causes of hypertension include chronic renal disease.     Review of systems  Constitutional: + Minimally fluctuating body weight,  current  Body mass index is 52.78 kg/m. , no fatigue, no subjective hyperthermia, no subjective hypothermia Eyes: no blurry vision, no xerophthalmia ENT: no sore throat, no nodules palpated in throat, no dysphagia/odynophagia, no hoarseness Cardiovascular: no Chest Pain, no Shortness of Breath, no palpitations, no leg swelling Respiratory: - cough, no shortness of breath Gastrointestinal: no Nausea/Vomiting/Diarhhea Musculoskeletal:  + Bilateral leg swelling, no muscle/joint aches Skin: no rashes, no hyperemia Neurological: no  tremors, no numbness, no tingling, no dizziness Psychiatric: no depression, no anxiety   Objective:    BP (!) 171/90   Pulse 67   Ht 5\' 6"  (1.676 m)   Wt (!) 327 lb (148.3 kg)   BMI 52.78 kg/m   Wt Readings from Last 3 Encounters:  05/23/19 (!) 327 lb (148.3 kg)  08/15/18 (!) 308 lb (139.7 kg)  05/01/18 (!) 312 lb (141.5 kg)      Physical Exam- Limited  Constitutional:  Body mass index is 52.78 kg/m. , not in acute distress, normal state of mind Eyes:  EOMI, no exophthalmos Neck: Supple Thyroid: No gross goiter Respiratory: Adequate breathing efforts Musculoskeletal:   + Bilateral pretibial edema, no gross deformities, strength intact in all four extremities, no gross restriction of joint movements Skin:  no rashes, no hyperemia Neurological: no tremor with outstretched hands,   CMP     Component Value Date/Time   NA 138 06/22/2014 2251   K 3.4 12/04/2018 0000   CL 101 06/22/2014 2251   CO2 26 06/22/2014 2251   GLUCOSE 252 (H) 06/22/2014 2251   BUN 24 (A) 12/04/2018 0000   CREATININE 1.2 12/04/2018 0000   CREATININE 0.79 06/22/2014 2251   CALCIUM 9.0 06/22/2014 2251   AST 18 12/04/2018 0000   ALT 19 12/04/2018 0000   ALKPHOS 161 (A) 12/04/2018 0000   GFRNONAA >90 06/22/2014 2251   GFRAA >90 06/22/2014 2251    Diabetic Labs (most recent): Lab Results  Component Value Date   HGBA1C 6.2 05/17/2019   HGBA1C 6.2 12/04/2018   HGBA1C 10.9 04/11/2018     Lipid Panel ( most recent) Lipid Panel     Component Value Date/Time   CHOL 132 01/02/2018 0000   TRIG 165 (A) 01/02/2018 0000   HDL 35 01/02/2018 0000   LDLCALC 66 01/02/2018 0000      Lab Results  Component Value Date   TSH 1.30 03/17/2017      Assessment & Plan:   1. Type 2 diabetes mellitus with stage 2 chronic kidney disease, with long-term current use of insulin (HCC)  - Alexander [name] has currently uncontrolled symptomatic type 2 DM since 54 years of age. -His logs show controlled  glycemic profile both fasting and postprandial.  His recent previsit labs show A1c of 6.2%. -He denies hypoglycemia since last visit. -his diabetes is complicated by renal insufficiency, peripheral neuropathy, obesity/sedentary life , chronic heavy smoking, and Alexander [name] remains at extremely high risk for more acute and chronic complications which include CAD, CVA, CKD, retinopathy, and neuropathy. These are all discussed in detail with the patient.  - I have counseled him on diet management and weight  loss, by adopting a carbohydrate restricted/protein rich diet. -He admits to dietary indiscretions including consumption of sweets and sweetened beverages.  - he  admits there is a room for improvement in his diet and drink choices. -  Suggestion is made for him to avoid simple carbohydrates  from his diet including Cakes, Sweet Desserts / Pastries, Ice Cream, Soda (diet and regular), Sweet Tea, Candies, Chips, Cookies, Sweet Pastries,  Store Bought Juices, Alcohol in Excess of  1-2 drinks a day, Artificial Sweeteners, Coffee Creamer, and "Sugar-free" Products. This will help patient to have stable blood glucose profile and potentially avoid unintended weight gain.   - I encouraged him to switch to  unprocessed or minimally processed complex starch and increased protein intake (animal or plant source), fruits, and vegetables.  - he is advised to stick to a routine mealtimes to eat 3 meals  a day and avoid unnecessary snacks ( to snack only to correct hypoglycemia).    - I have approached him with the following individualized plan to manage diabetes and patient reluctantly accepts.     -He is advised to increase Levemir to 64 units nightly, lower Humalog to 5  units 3 times a day with meals  for pre-meal BG readings of 90-150mg /dl, plus patient specific correction dose for unexpected hyperglycemia above 150mg /dl, associated with strict monitoring of glucose 4 times a day-before meals and at  bedtime. - Patient is warned not to take insulin without proper monitoring per orders. -Adjustment parameters are given for hypo and hyperglycemia in writing. -Patient is encouraged to call clinic for blood glucose levels less than 70 or above 300 mg /dl.   -Patient does not tolerate metformin. -I benefit from low-dose glipizide.  I discussed and added glipizide 5 mg XL p.o. daily at breakfast.  -Patient is not a suitable candidate for SGLT2 inhibitors due to CKD. -Reportedly, he will was given samples of Invokana from his nephrologist office, he is advised to discontinue.  - Patient specific target  A1c;  LDL, HDL, Triglycerides, and  Waist Circumference were discussed in detail.  2) BP/HTN: His blood pressure is not controlled to target.   He is advised to continue current medications including lisinopril 20 mg p.o. daily along with amlodipine .  Hydrochlorothiazide 25 mg p.o. daily was recently added.  3) Lipids/HPL: His recent lipid panel showed controlled LDL at 66.  He is advised to continue atorvastatin 40 mg p.o. nightly.        4)  Weight/Diet: He missed his diabetes education appointment.  exercise, and detailed carbohydrates information provided.  5) Chronic Care/Health Maintenance:  -he  is on ACEI/ARB and Statin medications and  is encouraged to continue to follow up with Ophthalmology, Dentist,  Podiatrist at least yearly or according to recommendations, and advised to  Quit . He has 50+-pack-year history of smoking.  I have recommended yearly flu vaccine and pneumonia vaccination at least every 5 years; moderate intensity exercise for up to 150 minutes weekly; and  sleep for at least 7 hours a day.  - I advised patient to maintain close follow up with Alexander Riley, FNP for primary care needs.  - Time spent on this patient care encounter:  35 min, of which > 50% was spent in  counseling and the rest reviewing his blood glucose logs , discussing his hypoglycemia and  hyperglycemia episodes, reviewing his current and  previous labs / studies  ( including abstraction from other facilities) and medications  doses and developing  a  long term treatment plan and documenting his care.   Please refer to Patient Instructions for Blood Glucose Monitoring and Insulin/Medications Dosing Guide"  in media tab for additional information. Please  also refer to " Patient Self Inventory" in the Media  tab for reviewed elements of pertinent patient history.  Alexander [name] participated in the discussions, expressed understanding, and voiced agreement with the above plans.  All questions were answered to his satisfaction. he is encouraged to contact clinic should he have any questions or concerns prior to his return visit.   Follow up plan: -He will return in 5 weeks with repeat labs, meter and logs. Marquis Lunch, MD [facility] [address] [place], [place] [address] Phone: [phone]  Fax: [phone]    05/23/2019, 12:57 PM  This note was partially dictated with voice recognition software. Similar sounding words can be transcribed inadequately or may not  be corrected upon review.

## 2019-05-23 NOTE — Patient Instructions (Signed)

## 2019-08-16 LAB — BASIC METABOLIC PANEL
BUN: 11 (ref 4–21)
CO2: 28 — AB (ref 13–22)
Chloride: 100 (ref 99–108)
Creatinine: 1.3 (ref 0.6–1.3)
Glucose: 127
Potassium: 3 — AB (ref 3.4–5.3)
Sodium: 145 (ref 137–147)

## 2019-08-16 LAB — MICROALBUMIN, URINE: Microalb, Ur: 359.4

## 2019-08-16 LAB — LIPID PANEL
Cholesterol: 125 (ref 0–200)
HDL: 30 — AB (ref 35–70)
LDL Cholesterol: 69
Triglycerides: 145 (ref 40–160)

## 2019-08-16 LAB — COMPREHENSIVE METABOLIC PANEL
Calcium: 9 (ref 8.7–10.7)
GFR calc Af Amer: 72
GFR calc non Af Amer: 62

## 2019-08-16 LAB — HEMOGLOBIN A1C: Hemoglobin A1C: 6.2

## 2019-09-03 ENCOUNTER — Other Ambulatory Visit: Payer: Self-pay

## 2019-09-03 DIAGNOSIS — E1122 Type 2 diabetes mellitus with diabetic chronic kidney disease: Secondary | ICD-10-CM

## 2019-09-03 MED ORDER — GLIPIZIDE ER 5 MG PO TB24: 5.0000 mg | ORAL_TABLET | Freq: Every day | ORAL | 3 refills | Status: DC

## 2019-09-10 ENCOUNTER — Other Ambulatory Visit: Payer: Self-pay

## 2019-09-10 DIAGNOSIS — N182 Chronic kidney disease, stage 2 (mild): Secondary | ICD-10-CM

## 2019-09-10 MED ORDER — TRUEPLUS LANCETS 30G MISC: 3 refills | Status: AC

## 2019-09-27 ENCOUNTER — Ambulatory Visit: Payer: Medicare Other | Admitting: "Endocrinology

## 2019-10-04 ENCOUNTER — Other Ambulatory Visit: Payer: Self-pay

## 2019-10-04 ENCOUNTER — Encounter: Payer: Self-pay | Admitting: "Endocrinology

## 2019-10-04 ENCOUNTER — Ambulatory Visit (INDEPENDENT_AMBULATORY_CARE_PROVIDER_SITE_OTHER): Payer: Medicare Other | Admitting: "Endocrinology

## 2019-10-04 VITALS — BP 125/74 | HR 48 | Ht 66.0 in | Wt 300.2 lb

## 2019-10-04 DIAGNOSIS — I10 Essential (primary) hypertension: Secondary | ICD-10-CM | POA: Diagnosis not present

## 2019-10-04 DIAGNOSIS — F17200 Nicotine dependence, unspecified, uncomplicated: Secondary | ICD-10-CM

## 2019-10-04 DIAGNOSIS — E1122 Type 2 diabetes mellitus with diabetic chronic kidney disease: Secondary | ICD-10-CM

## 2019-10-04 DIAGNOSIS — E782 Mixed hyperlipidemia: Secondary | ICD-10-CM

## 2019-10-04 DIAGNOSIS — N182 Chronic kidney disease, stage 2 (mild): Secondary | ICD-10-CM

## 2019-10-04 DIAGNOSIS — Z794 Long term (current) use of insulin: Secondary | ICD-10-CM

## 2019-10-04 MED ORDER — LEVEMIR FLEXTOUCH 100 UNIT/ML ~~LOC~~ SOPN: 50.0000 [IU] | PEN_INJECTOR | Freq: Every day | SUBCUTANEOUS | 2 refills | Status: DC

## 2019-10-04 NOTE — Progress Notes (Signed)
10/04/2019, 9:58 AM   Endocrinology follow-up note    Subjective:    Patient ID: Alexander [name], male    DOB: [birth year].  Alexander [name] is being seen in follow-up  for management of currently uncontrolled type 2 diabetes, hyperlipidemia, hypertension. PCP: Alita Chyle, FNP.   Past Medical History:  Diagnosis Date  . Diabetes mellitus without complication (HCC)   . Diabetes mellitus, type II (HCC)   . Hyperlipidemia   . Hypertension   . Kidney stones   . Pneumococcal pneumonia (HCC) 2014  . Sleep apnea    Past Surgical History:  Procedure Laterality Date  . KIDNEY SURGERY    . LITHOTRIPSY    . REMOVAL OF GASTROSTOMY TUBE    . TRACHEOSTOMY CLOSURE     Social History   Socioeconomic History  . Marital status: Married    Spouse name: Not on file  . Number of children: Not on file  . Years of education: Not on file  . Highest education level: Not on file  Occupational History  . Not on file  Tobacco Use  . Smoking status: Current Every Day Smoker    Packs/day: 1.00    Years: 40.00    Pack years: 40.00    Types: Cigars, Cigarettes  . Smokeless tobacco: Never Used  Vaping Use  . Vaping Use: Never used  Substance and Sexual Activity  . Alcohol use: No  . Drug use: Yes    Types: Marijuana  . Sexual activity: Not on file  Other Topics Concern  . Not on file  Social History Narrative  . Not on file   Social Determinants of Health   Financial Resource Strain:   . Difficulty of Paying Living Expenses:   Food Insecurity:   . Worried About Programme researcher, broadcasting/film/video in the Last Year:   . Barista in the Last Year:   Transportation Needs:   . Freight forwarder (Medical):   Marland Kitchen Lack of Transportation (Non-Medical):   Physical Activity:   . Days of Exercise per Week:   . Minutes of Exercise per Session:   Stress:   . Feeling of Stress :   Social Connections:   . Frequency of Communication with Friends and Family:   .  Frequency of Social Gatherings with Friends and Family:   . Attends Religious Services:   . Active Member of Clubs or Organizations:   . Attends Banker Meetings:   Marland Kitchen Marital Status:    Outpatient Encounter Medications as of 10/04/2019  Medication Sig  . albuterol (PROVENTIL HFA;VENTOLIN HFA) 108 (90 Base) MCG/ACT inhaler Inhale into the lungs every 4 (four) hours as needed for wheezing or shortness of breath.  Marland Kitchen amLODipine (NORVASC) 10 MG tablet Take 10 mg by mouth daily.  Marland Kitchen atenolol (TENORMIN) 25 MG tablet daily.  Marland Kitchen atorvastatin (LIPITOR) 40 MG tablet Take 40 mg by mouth daily.  . busPIRone (BUSPAR) 10 MG tablet buspirone 10 mg tablet  TAKE ONE TABLET BY MOUTH TWICE DAILY FOR ANXIETY  . DULoxetine HCl 60 MG CSDR Take 1 tablet by mouth 2 (two) times daily.   . fluticasone furoate-vilanterol (BREO ELLIPTA) 200-25 MCG/INH AEPB Inhale 1 puff into the lungs daily.  . furosemide (LASIX) 20 MG tablet 40 mg daily. Take  daily then take 1 extra pill 2 times a week  . glipiZIDE (GLUCOTROL XL) 5 MG 24 hr tablet Take 1 tablet (5 mg total)  by mouth daily with breakfast.  . glucose blood test strip 1 each by Other route 4 (four) times daily. Use as instructed qid. EZ Max test strips. E11.65  . hydrochlorothiazide (HYDRODIURIL) 25 MG tablet daily.  . insulin detemir (LEVEMIR FLEXTOUCH) 100 UNIT/ML FlexPen Inject 50 Units into the skin at bedtime.  . insulin lispro (HUMALOG KWIKPEN) 100 UNIT/ML KwikPen Inject 0.05-0.11 mLs (5-11 Units total) into the skin 3 (three) times daily before meals.  . lamoTRIgine (LAMICTAL) 100 MG tablet 200 mg 2 (two) times daily.   Marland Kitchen lisinopril (PRINIVIL,ZESTRIL) 20 MG tablet Take 40 mg by mouth daily.   . predniSONE (DELTASONE) 10 MG tablet Take 10 mg by mouth 2 (two) times daily with a meal.  . traZODone (DESYREL) 150 MG tablet Take by mouth at bedtime.  . TRUEplus Lancets 30G MISC Use as directed to check blood glucose four times daily  . [DISCONTINUED]  DULoxetine (CYMBALTA) 30 MG capsule Take 30 mg by mouth daily.  . [DISCONTINUED] Insulin Detemir (LEVEMIR FLEXTOUCH) 100 UNIT/ML Pen Inject 64 Units into the skin at bedtime.   No facility-administered encounter medications on file as of 10/04/2019.    ALLERGIES: Allergies  Allergen Reactions  . Latex   . Metformin And Related   . Pioglitazone     VACCINATION STATUS:  There is no immunization history on file for this patient.  Diabetes He presents for his follow-up diabetic visit. He has type 2 diabetes mellitus. Onset time: He was diagnosed at approximate age of 29 years. His disease course has been stable. There are no hypoglycemic associated symptoms. Pertinent negatives for hypoglycemia include no confusion, headaches, pallor or seizures. Pertinent negatives for diabetes include no chest pain, no fatigue, no polydipsia, no polyphagia, no polyuria and no weakness. There are no hypoglycemic complications. Symptoms are stable. Diabetic complications include nephropathy and peripheral neuropathy. Risk factors for coronary artery disease include dyslipidemia, diabetes mellitus, family history, male sex, obesity, hypertension, sedentary lifestyle and tobacco exposure. Current diabetic treatment includes insulin injections (He is currently on Levemir 60 units twice daily, Humalog 5 units 3 times daily AC, glimepiride.). He is compliant with treatment some of the time. His weight is increasing steadily. He is following a generally unhealthy diet. When asked about meal planning, he reported none. He has not had a previous visit with a dietitian. He never participates in exercise. His home blood glucose trend is decreasing steadily. His breakfast blood glucose range is generally 140-180 mg/dl. His lunch blood glucose range is generally 140-180 mg/dl. His dinner blood glucose range is generally 140-180 mg/dl. His bedtime blood glucose range is generally 140-180 mg/dl. His overall blood glucose range is  140-180 mg/dl. An ACE inhibitor/angiotensin II receptor blocker is being taken. Eye exam is current.  Hyperlipidemia This is a chronic problem. The current episode started more than 1 year ago. The problem is uncontrolled. Exacerbating diseases include chronic renal disease, diabetes and obesity. Pertinent negatives include no chest pain, myalgias or shortness of breath. Risk factors for coronary artery disease include family history, dyslipidemia, diabetes mellitus, hypertension, male sex, obesity and a sedentary lifestyle.  Hypertension This is a chronic problem. The current episode started more than 1 year ago. The problem is uncontrolled. Pertinent negatives include no chest pain, headaches, neck pain, palpitations or shortness of breath. Risk factors for coronary artery disease include dyslipidemia, diabetes mellitus, male gender, obesity, sedentary lifestyle, smoking/tobacco exposure and family history. Past treatments include ACE inhibitors and calcium channel blockers. Identifiable causes of hypertension include  chronic renal disease.    Review of systems  Constitutional: + Minimally fluctuating body weight,  current  Body mass index is 48.45 kg/m. , no fatigue, no subjective hyperthermia, no subjective hypothermia Eyes: no blurry vision, no xerophthalmia ENT: no sore throat, no nodules palpated in throat, no dysphagia/odynophagia, no hoarseness Cardiovascular: no Chest Pain, no Shortness of Breath, no palpitations, no leg swelling Respiratory: - cough, no shortness of breath Gastrointestinal: no Nausea/Vomiting/Diarhhea Musculoskeletal:  + Bilateral leg swelling, no muscle/joint aches Skin: no rashes, no hyperemia Neurological: no tremors, no numbness, no tingling, no dizziness Psychiatric: no depression, no anxiety   Objective:    BP 125/74   Pulse (!) 48   Ht 5\' 6"  (1.676 m)   Wt (!) 300 lb 3.2 oz (136.2 kg)   BMI 48.45 kg/m   Wt Readings from Last 3 Encounters:  10/04/19  (!) 300 lb 3.2 oz (136.2 kg)  05/23/19 (!) 327 lb (148.3 kg)  08/15/18 (!) 308 lb (139.7 kg)      Physical Exam- Limited  Constitutional:  Body mass index is 48.45 kg/m. , not in acute distress, normal state of mind Eyes:  EOMI, no exophthalmos Neck: Supple Thyroid: No gross goiter Respiratory: Adequate breathing efforts Musculoskeletal: no gross deformities, strength intact in all four extremities, no gross restriction of joint movements Skin:  no rashes, no hyperemia Neurological: no tremor with outstretched hands,    CMP     Component Value Date/Time   NA 145 08/16/2019 0000   K 3.0 (A) 08/16/2019 0000   CL 100 08/16/2019 0000   CO2 28 (A) 08/16/2019 0000   GLUCOSE 252 (H) 06/22/2014 2251   BUN 11 08/16/2019 0000   CREATININE 1.3 08/16/2019 0000   CREATININE 0.79 06/22/2014 2251   CALCIUM 9.0 08/16/2019 0000   AST 18 12/04/2018 0000   ALT 19 12/04/2018 0000   ALKPHOS 161 (A) 12/04/2018 0000   GFRNONAA 62 08/16/2019 0000   GFRAA 72 08/16/2019 0000    Diabetic Labs (most recent): Lab Results  Component Value Date   HGBA1C 6.2 05/17/2019   HGBA1C 6.2 12/04/2018   HGBA1C 10.9 04/11/2018     Lipid Panel ( most recent) Lipid Panel     Component Value Date/Time   CHOL 125 08/16/2019 0000   TRIG 145 08/16/2019 0000   HDL 30 (A) 08/16/2019 0000   LDLCALC 69 08/16/2019 0000      Lab Results  Component Value Date   TSH 1.30 03/17/2017      Assessment & Plan:   1. Type 2 diabetes mellitus with stage 2 chronic kidney disease, with long-term current use of insulin (HCC)  - Alexander [name] has currently uncontrolled symptomatic type 2 DM since 54 years of age. -He did not bring any logs.  His meter shows near target glycemic profile.  He is not monitoring adequately.  He states that his A1c was 6.2% during recent visit to his PMD.    -He has not taken a full dose of Levemir due to his concern about hypoglycemia, nor he did not document recent  hypoglycemia. -his diabetes is complicated by renal insufficiency, peripheral neuropathy, obesity/sedentary life , chronic heavy smoking, and Alexander [name] remains at extremely high risk for more acute and chronic complications which include CAD, CVA, CKD, retinopathy, and neuropathy. These are all discussed in detail with the patient.  - I have counseled him on diet management and weight loss, by adopting a carbohydrate restricted/protein rich diet. -He admits to  dietary indiscretions including consumption of sweets and sweetened beverages.  - he  admits there is a room for improvement in his diet and drink choices. -  Suggestion is made for him to avoid simple carbohydrates  from his diet including Cakes, Sweet Desserts / Pastries, Ice Cream, Soda (diet and regular), Sweet Tea, Candies, Chips, Cookies, Sweet Pastries,  Store Bought Juices, Alcohol in Excess of  1-2 drinks a day, Artificial Sweeteners, Coffee Creamer, and "Sugar-free" Products. This will help patient to have stable blood glucose profile and potentially avoid unintended weight gain.  - I encouraged him to switch to  unprocessed or minimally processed complex starch and increased protein intake (animal or plant source), fruits, and vegetables.  - he is advised to stick to a routine mealtimes to eat 3 meals  a day and avoid unnecessary snacks ( to snack only to correct hypoglycemia).    - I have approached him with the following individualized plan to manage diabetes and patient reluctantly accepts.     -Priority in his care will be to avoid hypoglycemia.  He is advised to lower Levemir to 50 units nightly, continue Humalog 5  units 3 times a day with meals  for pre-meal BG readings of 90-150mg /dl, plus patient specific correction dose for unexpected hyperglycemia above 150mg /dl, associated with strict monitoring of glucose 4 times a day-before meals and at bedtime. - Patient is warned not to take insulin without proper monitoring  per orders. -Adjustment parameters are given for hypo and hyperglycemia in writing. -Patient is encouraged to call clinic for blood glucose levels less than 70 or above 300 mg /dl.   -Patient does not tolerate metformin. -He is tolerating low-dose glipizide.  He is advised to continue glipizide 5 mg XL p.o. daily at breakfast.  -Patient is not a suitable candidate for SGLT2 inhibitors due to CKD. -Reportedly, he will was given samples of Invokana from his nephrologist office, he is advised to discontinue.  - Patient specific target  A1c;  LDL, HDL, Triglycerides, and  Waist Circumference were discussed in detail.  2) BP/HTN: His blood pressure is controlled to target.   He is advised to continue current medications including lisinopril 20 mg p.o. daily along with amlodipine .  Hydrochlorothiazide 25 mg p.o. daily was recently added.  3) Lipids/HPL: His recent lipid panel showed controlled LDL at 66.  He is advised to continue atorvastatin 40 mg p.o. nightly.  Elevation precautions discussed with him.          4)  Weight/Diet: His BMI is 48.4-clearly complicating his diabetes care.  He is a candidate for modest weight loss.  He missed his diabetes education appointment.  exercise, and detailed carbohydrates information provided.  5) Chronic Care/Health Maintenance:  -he  is on ACEI/ARB and Statin medications and  is encouraged to continue to follow up with Ophthalmology, Dentist,  Podiatrist at least yearly or according to recommendations, and advised to  Quit .  He has 50+ pack years of smoking history.   I have recommended yearly flu vaccine and pneumonia vaccination at least every 5 years; moderate intensity exercise for up to 150 minutes weekly; and  sleep for at least 7 hours a day.  - I advised patient to maintain close follow up with , FNP for primary care needs.  - Time spent on this patient care encounter:  35 min, of which > 50% was spent in  counseling and the rest  reviewing his blood glucose logs , discussing  his hypoglycemia and hyperglycemia episodes, reviewing his current and  previous labs / studies  ( including abstraction from other facilities) and medications  doses and developing a  long term treatment plan and documenting his care.   Please refer to Patient Instructions for Blood Glucose Monitoring and Insulin/Medications Dosing Guide"  in media tab for additional information. Please  also refer to " Patient Self Inventory" in the Media  tab for reviewed elements of pertinent patient history.  Alexander DubonnetLarry W [name] participated in the discussions, expressed understanding, and voiced agreement with the above plans.  All questions were answered to his satisfaction. he is encouraged to contact clinic should he have any questions or concerns prior to his return visit.   Follow up plan: -He will return in 5 weeks with repeat labs, meter and logs. Alexander LunchGebre Milinda Sweeney, MD [facility] [address] [place], [place] [address] Phone: [phone]  Fax: [phone]    10/04/2019, 9:58 AM  This note was partially dictated with voice recognition software. Similar sounding words can be transcribed inadequately or may not  be corrected upon review.

## 2019-10-04 NOTE — Patient Instructions (Signed)

## 2020-01-07 ENCOUNTER — Ambulatory Visit: Payer: Medicare Other | Admitting: Nurse Practitioner

## 2020-01-12 ENCOUNTER — Other Ambulatory Visit: Payer: Self-pay | Admitting: "Endocrinology

## 2020-01-12 DIAGNOSIS — E1122 Type 2 diabetes mellitus with diabetic chronic kidney disease: Secondary | ICD-10-CM

## 2020-01-12 DIAGNOSIS — N182 Chronic kidney disease, stage 2 (mild): Secondary | ICD-10-CM

## 2020-03-05 ENCOUNTER — Other Ambulatory Visit: Payer: Self-pay | Admitting: "Endocrinology

## 2020-03-05 DIAGNOSIS — E1122 Type 2 diabetes mellitus with diabetic chronic kidney disease: Secondary | ICD-10-CM

## 2020-04-24 ENCOUNTER — Other Ambulatory Visit: Payer: Self-pay | Admitting: "Endocrinology

## 2020-05-21 ENCOUNTER — Ambulatory Visit: Payer: Medicare Other | Admitting: Nurse Practitioner

## 2020-05-21 NOTE — Patient Instructions (Incomplete)

## 2020-06-17 ENCOUNTER — Other Ambulatory Visit: Payer: Self-pay | Admitting: "Endocrinology

## 2020-07-21 ENCOUNTER — Other Ambulatory Visit: Payer: Self-pay | Admitting: "Endocrinology

## 2020-08-08 ENCOUNTER — Other Ambulatory Visit: Payer: Self-pay | Admitting: "Endocrinology

## 2020-08-08 DIAGNOSIS — Z794 Long term (current) use of insulin: Secondary | ICD-10-CM

## 2020-08-08 DIAGNOSIS — E1122 Type 2 diabetes mellitus with diabetic chronic kidney disease: Secondary | ICD-10-CM

## 2020-08-13 ENCOUNTER — Other Ambulatory Visit: Payer: Self-pay | Admitting: "Endocrinology

## 2020-11-17 ENCOUNTER — Other Ambulatory Visit: Payer: Self-pay | Admitting: "Endocrinology

## 2023-09-20 LAB — COMPREHENSIVE METABOLIC PANEL WITH GFR: eGFR: 29

## 2023-09-20 LAB — BASIC METABOLIC PANEL WITH GFR
BUN: 42 — AB (ref 4–21)
Creatinine: 2.5 — AB (ref 0.6–1.3)

## 2023-09-20 LAB — CBC AND DIFFERENTIAL
HCT: 29 — AB (ref 41–53)
Hemoglobin: 8.7 — AB (ref 13.5–17.5)

## 2023-09-20 LAB — HEMOGLOBIN A1C: Hemoglobin A1C: 10.9

## 2024-01-27 NOTE — Patient Instructions (Signed)

## 2024-01-31 ENCOUNTER — Encounter: Payer: Self-pay | Admitting: Nurse Practitioner

## 2024-01-31 ENCOUNTER — Ambulatory Visit (INDEPENDENT_AMBULATORY_CARE_PROVIDER_SITE_OTHER): Admitting: Nurse Practitioner

## 2024-01-31 VITALS — BP 110/68 | HR 77 | Ht 66.0 in | Wt 264.0 lb

## 2024-01-31 DIAGNOSIS — Z7984 Long term (current) use of oral hypoglycemic drugs: Secondary | ICD-10-CM

## 2024-01-31 DIAGNOSIS — E782 Mixed hyperlipidemia: Secondary | ICD-10-CM

## 2024-01-31 DIAGNOSIS — E559 Vitamin D deficiency, unspecified: Secondary | ICD-10-CM

## 2024-01-31 DIAGNOSIS — E1122 Type 2 diabetes mellitus with diabetic chronic kidney disease: Secondary | ICD-10-CM

## 2024-01-31 DIAGNOSIS — Z794 Long term (current) use of insulin: Secondary | ICD-10-CM | POA: Diagnosis not present

## 2024-01-31 DIAGNOSIS — I10 Essential (primary) hypertension: Secondary | ICD-10-CM

## 2024-01-31 DIAGNOSIS — N184 Chronic kidney disease, stage 4 (severe): Secondary | ICD-10-CM

## 2024-01-31 LAB — POCT GLYCOSYLATED HEMOGLOBIN (HGB A1C): Hemoglobin A1C: 9.7 % — AB (ref 4.0–5.6)

## 2024-01-31 MED ORDER — FREESTYLE LIBRE 3 PLUS SENSOR MISC: 3 refills | Status: AC

## 2024-01-31 NOTE — Progress Notes (Signed)
 Endocrinology Consult Note       01/31/2024, 10:51 AM   Subjective:    Patient ID: Alexander [name], male    DOB: 1965/08/28.  MARVON SHILLINGBURG is being seen in consultation for management of currently uncontrolled symptomatic diabetes requested by  Theo Rumaldo SAILOR, FNP.   Past Medical History:  Diagnosis Date   Diabetes mellitus without complication (HCC)    Diabetes mellitus, type II (HCC)    Hyperlipidemia    Hypertension    Kidney stones    Pneumococcal pneumonia 2014   Sleep apnea     Past Surgical History:  Procedure Laterality Date   KIDNEY SURGERY     LITHOTRIPSY     REMOVAL OF GASTROSTOMY TUBE     TRACHEOSTOMY CLOSURE      Social History   Socioeconomic History   Marital status: Married    Spouse name: Not on file   Number of children: Not on file   Years of education: Not on file   Highest education level: Not on file  Occupational History   Not on file  Tobacco Use   Smoking status: Every Day    Current packs/day: 1.00    Average packs/day: 1 pack/day for 40.0 years (40.0 ttl pk-yrs)    Types: Cigars, Cigarettes   Smokeless tobacco: Never  Vaping Use   Vaping status: Never Used  Substance and Sexual Activity   Alcohol use: No   Drug use: Yes    Types: Marijuana   Sexual activity: Not on file  Other Topics Concern   Not on file  Social History Narrative   Not on file   Social Drivers of Health   Financial Resource Strain: Not on file  Food Insecurity: Not on file  Transportation Needs: Not on file  Physical Activity: Not on file  Stress: Not on file  Social Connections: Not on file    Family History  Problem Relation Age of Onset   Diabetes Mother    Diabetes Father    Heart attack Father    Stroke Father     Outpatient Encounter Medications as of 01/31/2024  Medication Sig   albuterol (PROVENTIL HFA;VENTOLIN HFA) 108 (90 Base) MCG/ACT inhaler Inhale into the  lungs every 4 (four) hours as needed for wheezing or shortness of breath.   amLODipine (NORVASC) 10 MG tablet Take 10 mg by mouth daily.   atenolol (TENORMIN) 25 MG tablet daily.   atorvastatin (LIPITOR) 40 MG tablet Take 40 mg by mouth daily.   busPIRone (BUSPAR) 10 MG tablet buspirone 10 mg tablet  TAKE ONE TABLET BY MOUTH TWICE DAILY FOR ANXIETY   ciprofloxacin (CIPRO) 500 MG tablet Take 500 mg by mouth 2 (two) times daily.   Continuous Glucose Sensor (FREESTYLE LIBRE 3 PLUS SENSOR) MISC Change sensor every 15 days.   DULoxetine HCl 60 MG CSDR Take 1 tablet by mouth 2 (two) times daily.    fluticasone furoate-vilanterol (BREO ELLIPTA) 200-25 MCG/INH AEPB Inhale 1 puff into the lungs daily.   hydrochlorothiazide (HYDRODIURIL) 25 MG tablet daily.   lamoTRIgine (LAMICTAL) 100 MG tablet 200 mg 2 (two) times daily.    lisinopril (PRINIVIL,ZESTRIL) 20 MG tablet Take 40  mg by mouth daily.    traZODone (DESYREL) 150 MG tablet Take by mouth at bedtime.   TRUEplus Lancets 30G MISC Use as directed to check blood glucose four times daily   [DISCONTINUED] glipiZIDE  (GLUCOTROL  XL) 5 MG 24 hr tablet TAKE ONE TABLET BY MOUTH DAILY WITH BREAKFAST   glipiZIDE  (GLUCOTROL  XL) 2.5 MG 24 hr tablet Take 2.5 mg by mouth daily with breakfast.   [DISCONTINUED] EASYMAX TEST test strip USE AS DIRECTED FOUR TIMES DAILY (Patient not taking: Reported on 01/31/2024)   [DISCONTINUED] furosemide (LASIX) 20 MG tablet 40 mg daily. Take 40mg  daily then take 1 extra pill 2 times a week (Patient not taking: Reported on 01/31/2024)   [DISCONTINUED] insulin  detemir (LEVEMIR  FLEXTOUCH) 100 UNIT/ML FlexPen Inject 50 Units into the skin at bedtime. (Patient not taking: Reported on 01/31/2024)   [DISCONTINUED] insulin  lispro (HUMALOG  KWIKPEN) 100 UNIT/ML KwikPen Inject 0.05-0.11 mLs (5-11 Units total) into the skin 3 (three) times daily before meals. (Patient not taking: Reported on 01/31/2024)   [DISCONTINUED] predniSONE (DELTASONE) 10  MG tablet Take 10 mg by mouth 2 (two) times daily with a meal. (Patient not taking: Reported on 01/31/2024)   No facility-administered encounter medications on file as of 01/31/2024.    ALLERGIES: Allergies  Allergen Reactions   Invokana [Canagliflozin] Other (See Comments)    Yeast infection   Latex    Metformin And Related    Pioglitazone     VACCINATION STATUS:  There is no immunization history on file for this patient.  Diabetes He presents for his initial diabetic visit. He has type 2 diabetes mellitus. Onset time: diagnosed at approx age of 54. His disease course has been improving. There are no hypoglycemic associated symptoms. (Patient does not get symptoms when sugar drops) Associated symptoms include foot paresthesias. There are no hypoglycemic complications. Diabetic complications include heart disease, nephropathy and peripheral neuropathy. Risk factors for coronary artery disease include diabetes mellitus, dyslipidemia, family history, obesity, male sex, hypertension, sedentary lifestyle and tobacco exposure. Current diabetic treatment includes oral agent (monotherapy). He is compliant with treatment most of the time. His weight is fluctuating minimally. He is following a generally unhealthy diet. When asked about meal planning, he reported none. He has not had a previous visit with a dietitian. He rarely participates in exercise. (He presents today, accompanied by his wife, for his consultation with no meter or logs to review.  He does have CGM at home [facility] 2) but had issues with connection recently.  He drinks unsweet tea (with splenda), occasionally SF soda, and occasionally water.  He does not eat on any routine pattern.  He does not engage in routine physical activity, is limited due to deconditioning and chronic pain.  He is UTD on eye exam, has seen podiatry in the past.) An ACE inhibitor/angiotensin II receptor blocker is being taken. He does not see a podiatrist.Eye exam  is current.     Review of systems  Constitutional: + Minimally fluctuating body weight, current Body mass index is 42.61 kg/m., no fatigue, no subjective hyperthermia, no subjective hypothermia, has urinary catheter in place (had recent surgery to remove kidney stone) Eyes: no blurry vision, no xerophthalmia ENT: no sore throat, no nodules palpated in throat, no dysphagia/odynophagia, no hoarseness Cardiovascular: no chest pain, no shortness of breath, no palpitations, no leg swelling Respiratory: no cough, no shortness of breath Gastrointestinal: no nausea/vomiting/diarrhea Musculoskeletal: + chronic knee and back pain- in WC today Skin: no rashes, no hyperemia, + dry flaky skin  to legs bilaterally Neurological: no tremors, no numbness, no tingling, no dizziness Psychiatric: no depression, no anxiety  Objective:     BP 110/68 (BP Location: Left Arm, Patient Position: Sitting, Cuff Size: Large)   Pulse 77   Ht 5' 6 (1.676 m)   Wt 264 lb (119.7 kg) Comment: Reported per the patient as he is in a wheelchair, unable to stand.  BMI 42.61 kg/m   Wt Readings from Last 3 Encounters:  01/31/24 264 lb (119.7 kg)  10/04/19 (!) 300 lb 3.2 oz (136.2 kg)  05/23/19 (!) 327 lb (148.3 kg)     BP Readings from Last 3 Encounters:  01/31/24 110/68  10/04/19 125/74  05/23/19 (!) 171/90     Physical Exam- Limited  Constitutional:  Body mass index is 42.61 kg/m. , not in acute distress, normal state of mind Eyes:  EOMI, no exophthalmos Neck: Supple Cardiovascular: RRR, no murmurs, rubs, or gallops, no edema Respiratory: Adequate breathing efforts, no crackles, rales, rhonchi, or wheezing Musculoskeletal: WC bound due to deconditioning and chronic pain Skin:  no rashes, no hyperemia, + dry flaky skin to BLE Neurological: no tremor with outstretched hands   Diabetic Foot Exam - Simple   No data filed      CMP ( most recent) CMP     Component Value Date/Time   NA 145 08/16/2019  0000   K 3.0 (A) 08/16/2019 0000   CL 100 08/16/2019 0000   CO2 28 (A) 08/16/2019 0000   GLUCOSE 252 (H) 06/22/2014 2251   BUN 42 (A) 09/20/2023 0000   CREATININE 2.5 (A) 09/20/2023 0000   CREATININE 0.79 06/22/2014 2251   CALCIUM 9.0 08/16/2019 0000   AST 18 12/04/2018 0000   ALT 19 12/04/2018 0000   ALKPHOS 161 (A) 12/04/2018 0000   EGFR 29 09/20/2023 0000   GFRNONAA 62 08/16/2019 0000     Diabetic Labs (most recent): Lab Results  Component Value Date   HGBA1C 9.7 (A) 01/31/2024   HGBA1C 10.9 09/20/2023   HGBA1C 6.2 08/16/2019   MICROALBUR 359.4 08/16/2019   MICROALBUR 1,301 03/17/2017     Lipid Panel ( most recent) Lipid Panel     Component Value Date/Time   CHOL 125 08/16/2019 0000   TRIG 145 08/16/2019 0000   HDL 30 (A) 08/16/2019 0000   LDLCALC 69 08/16/2019 0000      Lab Results  Component Value Date   TSH 1.30 03/17/2017           Assessment & Plan:   1) Type 2 diabetes mellitus with stage 4 chronic kidney disease, with long-term current use of insulin  (HCC) (Primary)  He presents today, accompanied by his wife, for his consultation with no meter or logs to review.  He does have CGM at home [facility] 2) but had issues with connection recently.  He drinks unsweet tea (with splenda), occasionally SF soda, and occasionally water.  He does not eat on any routine pattern.  He does not engage in routine physical activity, is limited due to deconditioning and chronic pain.  He is UTD on eye exam, has seen podiatry in the past.  He does not like most green vegetables.  - Ubaldo LELON Cooter has currently uncontrolled symptomatic type 2 DM since 58 years of age, with most recent A1c of 9.7%.   -Recent labs reviewed.  - I had a long discussion with him about the progressive nature of diabetes and the pathology behind its complications. -his diabetes is complicated by CKD,  CHF, neuropathy and he remains at a high risk for more acute and chronic complications which  include CAD, CVA, CKD, retinopathy, and neuropathy. These are all discussed in detail with him.  The following Lifestyle Medicine recommendations according to American College of Lifestyle Medicine [facility]) were discussed and offered to patient and he agrees to start the journey:  A. Whole Foods, Plant-based plate comprising of fruits and vegetables, plant-based proteins, whole-grain carbohydrates was discussed in detail with the patient.   A list for source of those nutrients were also provided to the patient.  Patient will use only water or unsweetened tea for hydration. B.  The need to stay away from risky substances including alcohol, smoking; obtaining 7 to 9 hours of restorative sleep, at least 150 minutes of moderate intensity exercise weekly, the importance of healthy social connections,  and stress reduction techniques were discussed. C.  A full color page of  Calorie density of various food groups per pound showing examples of each food groups was provided to the patient.  - I have counseled him on diet and weight management by adopting a carbohydrate restricted/protein rich diet. Patient is encouraged to switch to unprocessed or minimally processed complex starch and increased protein intake (animal or plant source), fruits, and vegetables. -  he is advised to stick to a routine mealtimes to eat 3 meals a day and avoid unnecessary snacks (to snack only to correct hypoglycemia).   - he acknowledges that there is a room for improvement in his food and drink choices. - Suggestion is made for him to avoid simple carbohydrates from his diet including Cakes, Sweet Desserts, Ice Cream, Soda (diet and regular), Sweet Tea, Candies, Chips, Cookies, Store Bought Juices, Alcohol in Excess of 1-2 drinks a day, Artificial Sweeteners, Coffee Creamer, and Sugar-free Products. This will help patient to have more stable blood glucose profile and potentially avoid unintended weight gain.  - I have approached  him with the following individualized plan to manage his diabetes and patient agrees:    -he is encouraged to start/continue monitoring glucose 4 times daily (using CGM), before meals and before bed, and to call the clinic if he has readings less than 70 or above 300 for 3 tests in a row.  I sent in upgrade of his CGM to [place] 3 plus sensors- to be used with his smart phone.  - Adjustment parameters are given to him for hypo and hyperglycemia in writing.  - he is advised to continue Glipizide  2.5 mg XL daily at breakfast, therapeutically suitable for patient.  I asked that he send me readings in 2 weeks so we can adjust his medication regimen according to his needs.   - he is not a candidate for Metformin due to concurrent renal insufficiency.  He has been on Actos in the past but did not tolerate it.  He also did not tolerate Invokana in the past.  - Specific targets for  A1c; LDL, HDL, and Triglycerides were discussed with the patient.  -He is not an ideal candidate for incretin therapy with GLP1 due to heavy smoking which increases his risk of pancreatitis.  2) Blood Pressure /Hypertension:  his blood pressure is controlled to target.   he is advised to continue his current medications as prescribed by PCP.  3) Lipids/Hyperlipidemia:    There is no recent lipid panel available to review.  he is advised to continue Lipitor 80 mg daily at bedtime.  Side effects and precautions discussed with him.  4)  Weight/Diet:  his Body mass index is 42.61 kg/m.  -  clearly complicating his diabetes care.   he is a candidate for weight loss. I discussed with him the fact that loss of 5 - 10% of his  current body weight will have the most impact on his diabetes management.  Exercise, and detailed carbohydrates information provided  -  detailed on discharge instructions.  5) Chronic Care/Health Maintenance: -he is on ACEI/ARB and Statin medications and is encouraged to initiate and continue to follow up  with Ophthalmology, Dentist, Podiatrist at least yearly or according to recommendations, and advised to QUIT SMOKING. I have recommended yearly flu vaccine and pneumonia vaccine at least every 5 years; moderate intensity exercise for up to 150 minutes weekly; and sleep for at least 7 hours a day.  - he is advised to maintain close follow up with Theo Rumaldo SAILOR, FNP for primary care needs, as well as his other providers for optimal and coordinated care.   - Time spent in this patient care: 60 min, which was spent in counseling him about his diabetes and the rest reviewing his blood glucose logs, discussing his hypoglycemia and hyperglycemia episodes, reviewing his current and previous labs/studies (including abstraction from other facilities) and medications doses and developing a long term treatment plan based on the latest standards of care/guidelines; and documenting his care.    Please refer to Patient Instructions for Blood Glucose Monitoring and Insulin /Medications Dosing Guide in media tab for additional information. Please also refer to Patient Self Inventory in the Media tab for reviewed elements of pertinent patient history.  Ubaldo LELON Cooter participated in the discussions, expressed understanding, and voiced agreement with the above plans.  All questions were answered to his satisfaction. he is encouraged to contact clinic should he have any questions or concerns prior to his return visit.     Follow up plan: - Return in about 4 months (around 05/30/2024) for Diabetes F/U with A1c in office, No previsit labs, Bring meter and logs.    Benton Rio, [facility] [address] [place], [place] [address] Phone: [phone] Fax: [phone]  01/31/2024, 10:51 AM

## 2024-06-04 ENCOUNTER — Ambulatory Visit: Admitting: Nurse Practitioner
